# Patient Record
Sex: Female | Born: 1940
Health system: Southern US, Community
[De-identification: ages and names within clinical notes are randomized; demographics above are authoritative.]

## PROBLEM LIST (undated history)

## (undated) DIAGNOSIS — J301 Allergic rhinitis due to pollen: Secondary | ICD-10-CM

## (undated) DIAGNOSIS — E663 Overweight: Secondary | ICD-10-CM

## (undated) DIAGNOSIS — M1991 Primary osteoarthritis, unspecified site: Secondary | ICD-10-CM

## (undated) DIAGNOSIS — N183 Chronic kidney disease, stage 3 (moderate): Secondary | ICD-10-CM

## (undated) DIAGNOSIS — E785 Hyperlipidemia, unspecified: Secondary | ICD-10-CM

## (undated) DIAGNOSIS — Z8611 Personal history of tuberculosis: Secondary | ICD-10-CM

## (undated) DIAGNOSIS — R569 Unspecified convulsions: Secondary | ICD-10-CM

## (undated) DIAGNOSIS — M4125 Other idiopathic scoliosis, thoracolumbar region: Secondary | ICD-10-CM

## (undated) DIAGNOSIS — I5189 Other ill-defined heart diseases: Secondary | ICD-10-CM

## (undated) DIAGNOSIS — I10 Essential (primary) hypertension: Secondary | ICD-10-CM

## (undated) HISTORY — PX: TUBAL LIGATION: SHX77

## (undated) HISTORY — DX: Primary osteoarthritis, unspecified site: M19.91

## (undated) HISTORY — DX: Chronic kidney disease, stage 3 (moderate): N18.3

## (undated) HISTORY — DX: Allergic rhinitis due to pollen: J30.1

## (undated) HISTORY — DX: Unspecified convulsions: R56.9

## (undated) HISTORY — DX: Hyperlipidemia, unspecified: E78.5

## (undated) HISTORY — PX: LUNG LOBECTOMY: SHX167

## (undated) HISTORY — DX: Other ill-defined heart diseases: I51.89

## (undated) HISTORY — PX: TONSILLECTOMY: SUR1361

## (undated) HISTORY — DX: Essential (primary) hypertension: I10

## (undated) HISTORY — DX: Personal history of tuberculosis: Z86.11

## (undated) HISTORY — DX: Overweight: E66.3

## (undated) HISTORY — DX: Other idiopathic scoliosis, thoracolumbar region: M41.25

---
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
## 2008-03-01 ENCOUNTER — Encounter: Payer: Self-pay | Admitting: Emergency Medicine

## 2008-03-01 ENCOUNTER — Inpatient Hospital Stay (HOSPITAL_COMMUNITY): Admission: EM | Admit: 2008-03-01 | Discharge: 2008-03-04 | Payer: Self-pay | Admitting: Cardiology

## 2008-03-01 ENCOUNTER — Ambulatory Visit: Payer: Self-pay | Admitting: *Deleted

## 2008-03-02 ENCOUNTER — Encounter (INDEPENDENT_AMBULATORY_CARE_PROVIDER_SITE_OTHER): Payer: Self-pay | Admitting: Cardiology

## 2008-05-14 ENCOUNTER — Encounter: Payer: Self-pay | Admitting: Emergency Medicine

## 2008-05-15 ENCOUNTER — Inpatient Hospital Stay (HOSPITAL_COMMUNITY): Admission: EM | Admit: 2008-05-15 | Discharge: 2008-05-21 | Payer: Self-pay

## 2010-12-06 NOTE — Discharge Summary (Signed)
Caitlin Cervantes, Caitlin Cervantes               ACCOUNT NO.:  [id number]   MEDICAL RECORD NO.:  [id number]          PATIENT TYPE:  INP   LOCATION:  [id number]                         FACILITY:  [facility]   PHYSICIAN:  Cherene Altes, M.D.DATE OF BIRTH:  31-Dec-1940   DATE OF ADMISSION:  05/15/2008  DATE OF DISCHARGE:  05/21/2008                               DISCHARGE SUMMARY   PRIMARY CARE PHYSICIAN:  Sigmund I. Gaynelle Arabian, M.D., internal medicine.   DISCHARGE DIAGNOSES:  1. Severe extensive right lung community-acquired pneumonia.  2. Prior history of tuberculosis with acid-fast bacillus smears      negative x3 during this hospital stay.      a.     Left lower lobe resection during childhood.  3. Hypertension.  4. History of non-ST segment elevation myocardial infarction secondary      to hypertensive emergency with no significant coronary artery      disease.  5. Seasonal allergies.  6. Lactose intolerance.  7. Sick euthyroidism - low thyroid-stimulating hormone with normal      free T3 and free T4.  8. Empiric treatment for seasonal influenza versus H1N1 with      completion of Tamiflu course.   DISCHARGE MEDICATIONS:  1. Aspirin 81 mg p.o. daily.  2. Metoprolol ER 100 mg p.o. daily.  3. Benazepril/Norvasc 10/21 p.o. daily.  4. Avelox 400 mg p.o. daily for 4 days, then stop.   FOLLOW UP:  The patient states that she intends to follow up at  [facility].  She is arranged for followup in 3-5  days as per her wishes for routine reevaluation of her medical status.   CONSULTATIONS:  None.   PROCEDURES:  None.   HOSPITAL COURSE:  Caitlin Cervantes is a very pleasant 70 year old  female who was admitted to the hospital on May 14, 2008 with  complaints of severe shortness of breath.  A chest x-ray revealed a  diffuse severe infiltrate consistent with a severe right lung pneumonia.  History from the patient revealed that the patient had been treated with  a left lower  lobe lung resection for TB during her childhood.  Out of  concern for this, the patient was transferred to a negative pressure  room and kept in isolation initially.  She was treated empirically for  possible H1N1 influenza versus seasonal influenza with a full course of  Tamiflu.  She was treated empirically with antibiotics to cover the  typical community-acquired pathogens as well as atypicals.  Sputum smear  was accomplished x3 and all 3 were negative for AFB.  Respiratory  isolation requiring negative pressure room was therefore discontinued.  Droplet isolation was continued.  With ongoing use of intravenous  antibiotics, the patient improved significantly.  She was titrated off  of oxygen and cleared for discharge home.   FOLLOW-UP PLANS:  Are as discussed above.  Fortunately, the patient's  other chronic medical problems remained stable throughout the course of  her hospital stay.      Cherene Altes, M.D.  Electronically Signed     JTM/MEDQ  D:  05/20/2008  T:  05/20/2008  Job:  [id number]   cc:   Shane Crutch. Gaynelle Arabian, M.D.

## 2010-12-06 NOTE — H&P (Signed)
NAMEDENETRICE, STARCK NO.:  [id number]   MEDICAL RECORD NO.:  [id number]          PATIENT TYPE:  EMS   LOCATION:  ED                           [facility]   PHYSICIAN:  Rise Patience, MDDATE OF BIRTH:  [birth year]   DATE OF ADMISSION:  05/14/2008  DATE OF DISCHARGE:                              HISTORY & PHYSICAL   PRIMARY CARE PHYSICIAN:  Unassigned.   CHIEF COMPLAINT:  Shortness of breath.   HISTORY OF PRESENT ILLNESS:  A 70 year old female with a recent  discharge in August of 2009 after being treated for hypertensive  urgency, non-ST elevation MI status post cardiac cath, history of  tuberculosis as a child, had a surgery with left lower lobe resection at  age 65, lactose intolerance presented to the ER because of increasing  shortness of breath.  Patient states her symptoms started over a week  ago where she did go to an urgent care.  She was given antibiotics,  Zithromax, despite which her symptoms have not improved.  It initially  started off as a cough with no productive sputum and eventually started  getting short of breath.  Shortness of breath initially was on exertion,  presently it is also present at rest.  Patient at this time was not in  any acute distress.  Denies any chest pain, fever, or chills.  Denies  any headache, diarrhea, dysuria, abdominal pain, nausea, vomiting,  weakness of limbs, or loss of consciousness.  Indeed, patient had a  chest x-ray which showed infiltrate and the patient has been admitted  for further evaluation and management of her pneumonia.   PAST MEDICAL HISTORY:  1. Non-ST elevation MI.  2. Hypertension.  3. Seasonal allergies.  4. Iron-deficiency anemia.  5. History of tuberculosis as a child at age 17 with left lower lobe      lobectomy.   PAST SURGICAL HISTORY:  1. Left lower lobe lobectomy.  2. Cardiac cath in August of 2009 which was normal.   MEDICATIONS PRIOR TO ADMISSION:  1. Metoprolol ER 100  mg p.o. daily.  2. Benazepril and Norvasc 10/20 mg p.o. daily.  3. Aspirin 81 mg p.o. daily.   ALLERGIES:  NO KNOWN DRUG ALLERGIES.   FAMILY HISTORY:  Nothing contributory.   SOCIAL HISTORY:  Patient denies smoking cigarettes, drinking alcohol, or  using illegal drugs.   REVIEW OF SYSTEMS:  As per in history of presenting illness, nothing  else significant.   PHYSICAL EXAMINATION:  Patient examined at bedside, not in acute  distress.  VITAL SIGNS:  Blood pressure is 147/76.  Pulse 96 per minute.  Temperature 98.6.  Respirations 18 per minute.  O2 sat 93%.  HEENT:  Anicteric.  No pallor.  CHEST:  Bilateral air entry present.  No rhonchi.  No crepitation.  HEART:  S1 and S2 heard.  ABDOMEN:  Soft, nontender.  Bowel sounds heard.  No guarding or  rigidity.  CNS:  Alert, awake, and oriented to time, place, and person.  No focal  deficits.  EXTREMITIES:  Peripheral pulses felt.  No edema.   LABS:  Chest x-ray shows extensive pneumonia of the right lung.  CBC,  WBC is 9.3, hemoglobin 10.4, hematocrit 32.7, platelets 480.  Basic  metabolic panel, sodium [id number], potassium 3.3, chloride 102, carbon dioxide  30, glucose of 111, BUN 4, creatinine 0.6, calcium 9.1, BNP 162.   ASSESSMENT:  1. Pneumonia.  2. History of hypertension.  3. Recent non-ST elevation myocardial infarction.  4. History of tuberculosis in childhood status post left-sided      lobectomy.  5. Iron-deficiency anemia.   PLAN:  Admit patient to tele.  We will follow blood cultures.  Obtain  sputum cultures.  Sputum for AFB, PPD.  Place patient on respiratory  isolation and droplet precautions.  Give IV antibiotics, Tamiflu.  Further recommendations as patient's condition evolves.      Rise Patience, MD  Electronically Signed     ANK/MEDQ  D:  05/14/2008  T:  05/14/2008  Job:  [id number]

## 2010-12-06 NOTE — H&P (Signed)
Caitlin Cervantes, Caitlin Cervantes               ACCOUNT NO.:  [id number]   MEDICAL RECORD NO.:  [id number]          PATIENT TYPE:  INP   LOCATION:  2912                         FACILITY:  [facility]   PHYSICIAN:  Justice Britain, MD   DATE OF BIRTH:  10/01/1940   DATE OF ADMISSION:  03/01/2008  DATE OF DISCHARGE:                              HISTORY & PHYSICAL   PRIMARY CARDIOLOGIST:  None.   PRIMARY MEDICAL DOCTOR:  None.   CHIEF COMPLAINT:  Shortness of breath.   HISTORY OF PRESENT ILLNESS:  Caitlin Cervantes is a 70 year old woman with  history of seasonal allergies who is not currently followed by a  physician.  She presented to the [facility] emergency department after  an episode of dyspnea on exertion today as she was leaving church.  She  thought she might have been having a panic attack and says that her  symptoms resolved after she sat down to rest for a little while.  She  has never had similar symptoms before.  She reports that she has been  having an exacerbation of her allergic symptoms over the past few days  with increased rhinorrhea and postnasal drip.  She denied any chest  pain, fevers, chills, cough (except related post nasal drip), lower  extremity edema, orthopnea, PND, palpitations, syncope or presyncope.   Later in the evening, she was telling a family member about her episode  and she was convinced to come in for further evaluation to the emergency  department.   In the [facility] emergency room, an EKG was obtained which  demonstrated LVH with ST depressions.  The physicians there were  concerned for ACS.  They also noted that her blood pressure was 200/120.  Rapid blood test demonstrated a positive troponin I of 0.39 and MB  fraction of 10.5.  She was treated with aspirin, heparin, Lopressor and  nitro paste and transferred to [facility] for further evaluation.  Here  her blood pressure was in the [id number] systolic, and she was treated with a  dose of labetalol with  improvement in her blood pressure.   PAST MEDICAL HISTORY:  1. Seasonal allergies.  2. History of TB as a child treated with a left lower lobe resection      with negative chest x-ray since.  3. No prior cardiac history and no history of cardiac catheterization.   ALLERGIES:  No known drug allergies, though she is lactose intolerance.   MEDICATIONS:  Zyrtec as needed, multivitamin daily, Mucinex daily.   SOCIAL HISTORY:  Caitlin Cervantes lives in [place] with her son.  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  She is  single and has one son.  She is a nonsmoker and does not drink alcohol.   FAMILY HISTORY:  Her father had coronary disease and heart attacks, but  lived to the age of 37.  There is also hypertension in her family  history.  There is no history of sudden cardiac death or hypertrophic  cardiomyopathy.   REVIEW OF SYSTEMS:  A 9-system review of systems  is completely negative  except as mentioned above.  Code status is full.   PHYSICAL EXAMINATION:  VITAL SIGNS:  The patient is afebrile, heart rate  was 110 on presentation and currently 75, blood pressure was 197/121 on  presentation, currently 126/76.  Oxygen saturation 100% on room air.  GENERAL:  She is a pleasant woman in no apparent distress.  HEENT:  Normocephalic atraumatic.  Extraocular movements intact.  Mucous  membranes moist.  NECK:  Supple without lymphadenopathy or thyromegaly.  There is no JVD.  Carotids are 2+ without bruits.  CARDIOVASCULAR:  Regular rate and rhythm with hyperdynamic precordium.  Normal S1-S2.  No S3.  Positive S4.  There are otherwise no murmurs,  rubs.  Peripheral pulses are 2+ and equal without bruits.  LUNGS:  Clear to auscultation bilaterally with decreased breath sounds  at the left base.  There is a long well-healed scar in the medial aspect  of the left scapula.  SKIN:  No rashes or lesions.  ABDOMEN:  Soft, nontender, nondistended with normal  active bowel sounds.  No rebound or guarding.  EXTREMITIES:  No clubbing, cyanosis or edema.  MUSCULOSKELETAL:  No significant joint deformities, effusions or  tenderness.  NEUROLOGICAL:  Exam is grossly normal.   RADIOLOGY:  Pleural chest x-ray demonstrated scoliosis, scarring at the  left costophrenic angle, but no edema or infiltrates.  EKG demonstrated  a rate of 100 with sinus rhythm, normal axis, normal intervals,  increased voltage meeting criteria for LVH with ST depressions in V4-V6  as well as in the inferior leads consistent with either repole or  ischemia.   LABORATORY DATA:  White count 8.9, hematocrit 33.5, platelets 478.  Sodium 135, potassium 3.1, CO2 26, BUN 3, creatinine 0.9, glucose 126,  BNP 58, CK 404, MB 10.5, troponin-I 0.39.  Urinalysis was negative.   ASSESSMENT/PLAN:  Caitlin Cervantes is a 70 year old woman presenting with  shortness of breath, LVH on EKG, hypertension, and positive troponin.  I  suspected that her underlying issue is longstanding and diagnosed  hypertension with hypertensive cardiomyopathy and LVH who presents now  with a hypertensive emergency and positive troponin in that setting.  Alternatively, this could be a manifestation of an acute coronary  syndrome/NSTEMI, but I think this is less likely.  1. Positive cardiac enzymes.  We will continue to cycle cardiac      enzymes and check lipids.  Will treat with heparin, aspirin,      statin, beta blocker and ACE inhibitor for possible acute coronary      syndrome.  Will plan for cardiac catheterization to evaluate her      coronaries and rule out acute coronary syndrome.  2. Hypertensive heart disease.  We will obtain an echocardiogram,      initiate therapy with beta blocker and ACE inhibitor.  Depending on      what her cardiac catheterization shows, her antihypertensive      regimen can be adjusted to the appropriate classes of medications.  3. History of TB as a child with a normal chest  x-rays and currently      normal chest x-ray.  4. Fluids, electrolytes, nutrition (FEN).  Will put on a no added salt      diet and replete her potassium.  5. Glucose was mildly elevated on presentation, although it is unclear      if this is fasting or not.  Will check hemoglobin A1c to evaluate      for undiagnosed diabetes.  Justice Britain, MD  Electronically Signed     DWM/MEDQ  D:  03/02/2008  T:  03/02/2008  Job:  [id number]

## 2010-12-09 NOTE — Discharge Summary (Signed)
Caitlin, Cervantes               ACCOUNT NO.:  [id number]   MEDICAL RECORD NO.:  [id number]          PATIENT TYPE:  INP   LOCATION:  2019                         FACILITY:  [facility]   PHYSICIAN:  Barnett Abu, M.D.  DATE OF BIRTH:  1940-09-21   DATE OF ADMISSION:  03/01/2008  DATE OF DISCHARGE:  03/04/2008                               DISCHARGE SUMMARY   DISCHARGE DIAGNOSES:  1. Hypertensive urgency, resolved.  2. Non-ST segment elevated myocardial infarction secondary to      hypertensive urgency.  3. Iron deficiency anemia.  4. Seasonal allergies.  5. History of tuberculosis as a child, treated with left lower lobe      resection with no chest x-ray since that time.  6. Lactose intolerance.   HOSPITAL COURSE:  Caitlin Cervantes is a 70 year old female who presented to [place]  [facility] emergency room after an episode of shortness of breath.  She  thought she might be having a panic attack and she states that her  symptoms resolved after she sat down to breathe.  Later that evening,  family member advised to return to the emergency room.  In the emergency  room, an EKG showed LVH with ST depression.  Her blood pressure was  noted to be at 200/120.  Her troponins were elevated up to 0.39  consistent with non-ST-segment elevated myocardial infarction.   Ultimately, she underwent cardiac catheterization which showed no  obstructive disease.  Her non-ST segment elevated myocardial infarction  was felt to be secondary to hypertensive urgency.  She was treated with  aggressive medical therapy and by March 04, 2008, she was felt to be  ready for discharge to home.   Lab studies during her hospital stay include hemoglobin of 9.7,  hematocrit 30.0, white count 9.9, platelets 479.  Anemia panel showed  low iron saturation.  Sodium 139, potassium 3.4, BUN 8, creatinine 0.80,  glucose 124, hemoglobin A1c 6.0, TSH 0.269, total cholesterol 160,  triglycerides 75, HDL 40, LDL 105, BNP 58.  Chest x-ray:   Marked  thoracolumbar scoliosis with probable scarring in the left costophrenic  angle.   DISCHARGE MEDICATIONS:  1. Lotrel 5/10 one tablet daily.  2. Metoprolol ER 50 mg a day.  3. Nu-Iron 150 mg twice a day.  4. Zyrtec p.r.n.  5. Mucinex as needed.   Remain on a low-sodium heart-healthy diet.  Clean cath site gently with  soap and water.  No scrubbing.  Increase activity slowly.  No lifting  over 10 pounds for 1 week.  No driving for 2 days.  Follow up with Dr.  Leonia Reeves on March 17, 2008, at 1 p.m.      Joesphine Bare, P.A.      Barnett Abu, M.D.  Electronically Signed    LB/MEDQ  D:  04/18/2008  T:  04/19/2008  Job:  [id number]

## 2011-04-21 LAB — CBC
HCT: 30 — ABNORMAL LOW
HCT: 33.5 — ABNORMAL LOW
Hemoglobin: 10.8 — ABNORMAL LOW
MCHC: 31.6
MCHC: 32.3
MCV: 85.4
RBC: 3.51 — ABNORMAL LOW
RBC: 3.52 — ABNORMAL LOW
RDW: 15.9 — ABNORMAL HIGH
WBC: 9.9

## 2011-04-21 LAB — RETICULOCYTES
RBC.: 3.6 — ABNORMAL LOW
Retic Count, Absolute: 54
Retic Ct Pct: 1.5

## 2011-04-21 LAB — TSH: TSH: 0.269 — ABNORMAL LOW

## 2011-04-21 LAB — HEPARIN LEVEL (UNFRACTIONATED)
Heparin Unfractionated: 1.11 — ABNORMAL HIGH
Heparin Unfractionated: 1.77 — ABNORMAL HIGH

## 2011-04-21 LAB — POCT CARDIAC MARKERS
CKMB, poc: 11.2
Troponin i, poc: 0.23 — ABNORMAL HIGH

## 2011-04-21 LAB — BASIC METABOLIC PANEL
CO2: 25
Calcium: 9.4
GFR calc Af Amer: >60
Glucose, Bld: 110 — ABNORMAL HIGH
Potassium: 3.1 — ABNORMAL LOW
Sodium: 142

## 2011-04-21 LAB — DIFFERENTIAL
Basophils Absolute: 0.3 — ABNORMAL HIGH
Basophils Relative: 3 — ABNORMAL HIGH
Eosinophils Absolute: 0.1
Monocytes Absolute: 0.7
Monocytes Relative: 8

## 2011-04-21 LAB — COMPREHENSIVE METABOLIC PANEL
ALT: 22
Alkaline Phosphatase: 71
CO2: 25
Creatinine, Ser: 0.8
GFR calc non Af Amer: >60
Glucose, Bld: 124 — ABNORMAL HIGH
Potassium: 3.4 — ABNORMAL LOW
Total Protein: 7.2

## 2011-04-21 LAB — CARDIAC PANEL(CRET KIN+CKTOT+MB+TROPI): Total CK: 353 — ABNORMAL HIGH

## 2011-04-21 LAB — FERRITIN: Ferritin: 13 (ref 10–291)

## 2011-04-21 LAB — LIPID PANEL
HDL: 40
LDL Cholesterol: 105 — ABNORMAL HIGH

## 2011-04-21 LAB — URINALYSIS, ROUTINE W REFLEX MICROSCOPIC
Glucose, UA: NEGATIVE
Hgb urine dipstick: NEGATIVE
Protein, ur: NEGATIVE
Specific Gravity, Urine: 1.006
Urobilinogen, UA: 0.2
pH: 6

## 2011-04-21 LAB — CK TOTAL AND CKMB (NOT AT ARMC)
CK, MB: 10.5 — ABNORMAL HIGH
CK, MB: 7.7 — ABNORMAL HIGH

## 2011-04-21 LAB — IRON AND TIBC
Iron: 22 — ABNORMAL LOW
Saturation Ratios: 5 — ABNORMAL LOW
TIBC: 405

## 2011-04-21 LAB — POCT I-STAT, CHEM 8
BUN: 3 — ABNORMAL LOW
Hemoglobin: 12.2
Potassium: 3.1 — ABNORMAL LOW
Sodium: 135

## 2011-04-21 LAB — B-NATRIURETIC PEPTIDE (CONVERTED LAB): Pro B Natriuretic peptide (BNP): 58

## 2011-04-21 LAB — VITAMIN B12: Vitamin B-12: 1734 — ABNORMAL HIGH (ref 211–911)

## 2011-04-25 LAB — AFB CULTURE WITH SMEAR (NOT AT ARMC): Acid Fast Smear: NONE SEEN

## 2011-04-25 LAB — BASIC METABOLIC PANEL
BUN: 3 mg/dL — ABNORMAL LOW (ref 6–23)
BUN: 4 mg/dL — ABNORMAL LOW (ref 6–23)
CO2: 29 mEq/L (ref 19–32)
CO2: 30 mEq/L (ref 19–32)
Calcium: 8.9 mg/dL (ref 8.4–10.5)
Calcium: 9.1 mg/dL (ref 8.4–10.5)
Chloride: 102 mEq/L (ref 96–112)
Chloride: 103 mEq/L (ref 96–112)
Creatinine, Ser: 0.6 mg/dL (ref 0.4–1.2)
Creatinine, Ser: 0.78 mg/dL (ref 0.4–1.2)
GFR calc Af Amer: >60 mL/min (ref >60)
GFR calc Af Amer: >60 mL/min (ref >60)
GFR calc non Af Amer: >60 mL/min (ref >60)
GFR calc non Af Amer: >60 mL/min (ref >60)
Glucose, Bld: 111 mg/dL — ABNORMAL HIGH (ref 70–99)
Potassium: 4.4 mEq/L (ref 3.5–5.1)
Potassium: 4.5 mEq/L (ref 3.5–5.1)
Sodium: 142 mEq/L (ref 135–145)
Sodium: 143 mEq/L (ref 135–145)

## 2011-04-25 LAB — COMPREHENSIVE METABOLIC PANEL
AST: 30 U/L (ref 0–37)
Albumin: 3.5 g/dL (ref 3.5–5.2)
Alkaline Phosphatase: 110 U/L (ref 39–117)
BUN: 3 mg/dL — ABNORMAL LOW (ref 6–23)
Chloride: 101 mEq/L (ref 96–112)
GFR calc Af Amer: >60 mL/min (ref >60)
Potassium: 3.3 mEq/L — ABNORMAL LOW (ref 3.5–5.1)
Total Bilirubin: 0.3 mg/dL (ref 0.3–1.2)
Total Protein: 7.8 g/dL (ref 6.0–8.3)

## 2011-04-25 LAB — CULTURE, BLOOD (ROUTINE X 2)

## 2011-04-25 LAB — CBC
HCT: 30.2 % — ABNORMAL LOW (ref 36.0–46.0)
HCT: 30.8 % — ABNORMAL LOW (ref 36.0–46.0)
HCT: 33.4 % — ABNORMAL LOW (ref 36.0–46.0)
Hemoglobin: 10.4 g/dL — ABNORMAL LOW (ref 12.0–15.0)
Hemoglobin: 10.6 g/dL — ABNORMAL LOW (ref 12.0–15.0)
MCHC: 31.8 g/dL (ref 30.0–36.0)
MCHC: 31.8 g/dL (ref 30.0–36.0)
MCV: 84.7 fL (ref 78.0–100.0)
MCV: 86.9 fL (ref 78.0–100.0)
Platelets: 437 10*3/uL — ABNORMAL HIGH (ref 150–400)
Platelets: 455 10*3/uL — ABNORMAL HIGH (ref 150–400)
Platelets: 480 10*3/uL — ABNORMAL HIGH (ref 150–400)
Platelets: 590 10*3/uL — ABNORMAL HIGH (ref 150–400)
RBC: 3.34 MIL/uL — ABNORMAL LOW (ref 3.87–5.11)
RBC: 3.57 MIL/uL — ABNORMAL LOW (ref 3.87–5.11)
RBC: 3.86 MIL/uL — ABNORMAL LOW (ref 3.87–5.11)
RDW: 18.1 % — ABNORMAL HIGH (ref 11.5–15.5)
RDW: 18.3 % — ABNORMAL HIGH (ref 11.5–15.5)
WBC: 10.8 10*3/uL — ABNORMAL HIGH (ref 4.0–10.5)
WBC: 8.5 10*3/uL (ref 4.0–10.5)

## 2011-04-25 LAB — DIFFERENTIAL
Basophils Absolute: 0 10*3/uL (ref 0.0–0.1)
Basophils Relative: 1 % (ref 0–1)
Lymphocytes Relative: 25 % (ref 12–46)
Monocytes Absolute: 0.9 10*3/uL (ref 0.1–1.0)
Neutro Abs: 5.7 10*3/uL (ref 1.7–7.7)
Neutrophils Relative %: 62 % (ref 43–77)

## 2011-04-25 LAB — CULTURE, RESPIRATORY W GRAM STAIN: Gram Stain: NONE SEEN

## 2011-04-25 LAB — T4, FREE: Free T4: 1.15 ng/dL (ref 0.89–1.80)

## 2011-04-25 LAB — B-NATRIURETIC PEPTIDE (CONVERTED LAB): Pro B Natriuretic peptide (BNP): 162 pg/mL — ABNORMAL HIGH (ref 0.0–100.0)

## 2015-05-22 ENCOUNTER — Inpatient Hospital Stay (HOSPITAL_COMMUNITY): Payer: Medicare Other

## 2015-05-22 ENCOUNTER — Emergency Department (HOSPITAL_COMMUNITY): Payer: Medicare Other

## 2015-05-22 ENCOUNTER — Inpatient Hospital Stay (HOSPITAL_COMMUNITY)
Admission: EM | Admit: 2015-05-22 | Discharge: 2015-05-24 | DRG: 069 | Disposition: A | Discharge status: Home / Self Care | Payer: Medicare Other | Attending: Student in an Organized Health Care Education/Training Program | Admitting: Student in an Organized Health Care Education/Training Program

## 2015-05-22 DIAGNOSIS — I10 Essential (primary) hypertension: Secondary | ICD-10-CM | POA: Diagnosis present

## 2015-05-22 DIAGNOSIS — R4701 Aphasia: Secondary | ICD-10-CM | POA: Diagnosis present

## 2015-05-22 DIAGNOSIS — E876 Hypokalemia: Secondary | ICD-10-CM | POA: Diagnosis present

## 2015-05-22 DIAGNOSIS — R402431 Glasgow coma scale score 3-8, in the field [EMT or ambulance]: Secondary | ICD-10-CM | POA: Diagnosis not present

## 2015-05-22 DIAGNOSIS — G459 Transient cerebral ischemic attack, unspecified: Secondary | ICD-10-CM | POA: Diagnosis not present

## 2015-05-22 DIAGNOSIS — R569 Unspecified convulsions: Secondary | ICD-10-CM | POA: Diagnosis present

## 2015-05-22 DIAGNOSIS — E785 Hyperlipidemia, unspecified: Secondary | ICD-10-CM | POA: Diagnosis present

## 2015-05-22 DIAGNOSIS — R2990 Unspecified symptoms and signs involving the nervous system: Secondary | ICD-10-CM | POA: Diagnosis present

## 2015-05-22 DIAGNOSIS — Z8611 Personal history of tuberculosis: Secondary | ICD-10-CM

## 2015-05-22 DIAGNOSIS — I639 Cerebral infarction, unspecified: Secondary | ICD-10-CM | POA: Diagnosis not present

## 2015-05-22 DIAGNOSIS — M47812 Spondylosis without myelopathy or radiculopathy, cervical region: Secondary | ICD-10-CM | POA: Diagnosis not present

## 2015-05-22 HISTORY — DX: Essential (primary) hypertension: I10

## 2015-05-22 LAB — RAPID URINE DRUG SCREEN, HOSP PERFORMED
AMPHETAMINES: NOT DETECTED
Barbiturates: NOT DETECTED
Benzodiazepines: NOT DETECTED
COCAINE: NOT DETECTED
OPIATES: NOT DETECTED
TETRAHYDROCANNABINOL: NOT DETECTED

## 2015-05-22 LAB — DIFFERENTIAL
Basophils Absolute: 0 10*3/uL (ref 0.0–0.1)
Basophils Relative: 0 %
EOS ABS: 0.2 10*3/uL (ref 0.0–0.7)
EOS PCT: 2 %
Lymphocytes Relative: 51 %
Lymphs Abs: 4.3 10*3/uL — ABNORMAL HIGH (ref 0.7–4.0)
Monocytes Absolute: 0.8 10*3/uL (ref 0.1–1.0)
Monocytes Relative: 10 %
NEUTROS PCT: 37 %
Neutro Abs: 3 10*3/uL (ref 1.7–7.7)

## 2015-05-22 LAB — URINALYSIS, ROUTINE W REFLEX MICROSCOPIC
BILIRUBIN URINE: NEGATIVE
Glucose, UA: NEGATIVE mg/dL
Hgb urine dipstick: NEGATIVE
Ketones, ur: NEGATIVE mg/dL
NITRITE: NEGATIVE
PH: 6 (ref 5.0–8.0)
Protein, ur: NEGATIVE mg/dL
SPECIFIC GRAVITY, URINE: 1.039 — AB (ref 1.005–1.030)
Urobilinogen, UA: 0.2 mg/dL (ref 0.0–1.0)

## 2015-05-22 LAB — ETHANOL

## 2015-05-22 LAB — URINE MICROSCOPIC-ADD ON

## 2015-05-22 LAB — I-STAT CHEM 8, ED
BUN: 16 mg/dL (ref 6–20)
Calcium, Ion: 1.1 mmol/L — ABNORMAL LOW (ref 1.13–1.30)
Chloride: 100 mmol/L — ABNORMAL LOW (ref 101–111)
Creatinine, Ser: 0.8 mg/dL (ref 0.44–1.00)
GLUCOSE: 168 mg/dL — AB (ref 65–99)
HEMATOCRIT: 40 % (ref 36.0–46.0)
HEMOGLOBIN: 13.6 g/dL (ref 12.0–15.0)
POTASSIUM: 2.8 mmol/L — AB (ref 3.5–5.1)
SODIUM: 139 mmol/L (ref 135–145)
TCO2: 21 mmol/L (ref 0–100)

## 2015-05-22 LAB — I-STAT TROPONIN, ED: Troponin i, poc: 0 ng/mL (ref 0.00–0.08)

## 2015-05-22 LAB — COMPREHENSIVE METABOLIC PANEL
ALBUMIN: 3.8 g/dL (ref 3.5–5.0)
ALT: 13 U/L — ABNORMAL LOW (ref 14–54)
ANION GAP: 18 — AB (ref 5–15)
AST: 34 U/L (ref 15–41)
Alkaline Phosphatase: 100 U/L (ref 38–126)
BUN: 14 mg/dL (ref 6–20)
CO2: 20 mmol/L — AB (ref 22–32)
Calcium: 9.3 mg/dL (ref 8.9–10.3)
Chloride: 98 mmol/L — ABNORMAL LOW (ref 101–111)
Creatinine, Ser: 0.96 mg/dL (ref 0.44–1.00)
GFR calc non Af Amer: 58 mL/min — ABNORMAL LOW (ref >60)
GLUCOSE: 174 mg/dL — AB (ref 65–99)
POTASSIUM: 2.9 mmol/L — AB (ref 3.5–5.1)
SODIUM: 136 mmol/L (ref 135–145)
TOTAL PROTEIN: 7.5 g/dL (ref 6.5–8.1)
Total Bilirubin: 0.3 mg/dL (ref 0.3–1.2)

## 2015-05-22 LAB — APTT: APTT: 26 s (ref 24–37)

## 2015-05-22 LAB — CBC
HEMATOCRIT: 36.2 % (ref 36.0–46.0)
Hemoglobin: 12 g/dL (ref 12.0–15.0)
MCH: 27.8 pg (ref 26.0–34.0)
MCHC: 33.1 g/dL (ref 30.0–36.0)
MCV: 83.8 fL (ref 78.0–100.0)
Platelets: 402 10*3/uL — ABNORMAL HIGH (ref 150–400)
RBC: 4.32 MIL/uL (ref 3.87–5.11)
RDW: 14.7 % (ref 11.5–15.5)
WBC: 8.3 10*3/uL (ref 4.0–10.5)

## 2015-05-22 LAB — CBG MONITORING, ED: Glucose-Capillary: 162 mg/dL — ABNORMAL HIGH (ref 65–99)

## 2015-05-22 LAB — PROTIME-INR
INR: 1.03 (ref 0.00–1.49)
Prothrombin Time: 13.7 seconds (ref 11.6–15.2)

## 2015-05-22 LAB — MAGNESIUM: Magnesium: 1.8 mg/dL (ref 1.7–2.4)

## 2015-05-22 MED ORDER — POTASSIUM CHLORIDE 10 MEQ/100ML IV SOLN
10.0000 meq | INTRAVENOUS | Status: AC
  Administered 2015-05-22 (×3): 10 meq via INTRAVENOUS
  Filled 2015-05-22 (×3): qty 100

## 2015-05-22 MED ORDER — SENNOSIDES-DOCUSATE SODIUM 8.6-50 MG PO TABS: 1.0000 | ORAL_TABLET | Freq: Every evening | ORAL | Status: DC | PRN

## 2015-05-22 MED ORDER — LORAZEPAM 2 MG/ML IJ SOLN
1.0000 mg | Freq: Once | INTRAMUSCULAR | Status: AC
  Administered 2015-05-22: 1 mg via INTRAVENOUS

## 2015-05-22 MED ORDER — LORAZEPAM 2 MG/ML IJ SOLN
INTRAMUSCULAR | Status: AC
  Administered 2015-05-22: 1 mg via INTRAVENOUS
  Filled 2015-05-22: qty 1

## 2015-05-22 MED ORDER — ENOXAPARIN SODIUM 40 MG/0.4ML ~~LOC~~ SOLN
40.0000 mg | SUBCUTANEOUS | Status: DC
  Administered 2015-05-22 – 2015-05-23 (×2): 40 mg via SUBCUTANEOUS
  Filled 2015-05-22 (×2): qty 0.4

## 2015-05-22 MED ORDER — ENOXAPARIN SODIUM 40 MG/0.4ML ~~LOC~~ SOLN: 40.0000 mg | SUBCUTANEOUS | Status: DC

## 2015-05-22 MED ORDER — STROKE: EARLY STAGES OF RECOVERY BOOK
Freq: Once | Status: AC
  Administered 2015-05-22: 19:00:00

## 2015-05-22 MED ORDER — IOHEXOL 350 MG/ML SOLN
50.0000 mL | Freq: Once | INTRAVENOUS | Status: AC | PRN
  Administered 2015-05-22: 50 mL via INTRAVENOUS

## 2015-05-22 MED ORDER — POTASSIUM CHLORIDE 10 MEQ/100ML IV SOLN
10.0000 meq | Freq: Once | INTRAVENOUS | Status: AC
  Administered 2015-05-22: 10 meq via INTRAVENOUS
  Filled 2015-05-22: qty 100

## 2015-05-22 MED ORDER — ADULT MULTIVITAMIN W/MINERALS CH
1.0000 | ORAL_TABLET | Freq: Every day | ORAL | Status: DC
  Administered 2015-05-22 – 2015-05-24 (×3): 1 via ORAL
  Filled 2015-05-22 (×3): qty 1

## 2015-05-22 NOTE — Consult Note (Signed)
Referring Physician: Pollina    Chief Complaint: Difficulty with speech  HPI: Caitlin Cervantes is an 74 y.o. female who was with her son today.  They went to the grocery store and she returned home normal.  He went to take a shower and when he came back to the room she was slumped over with the groceries on the floor.  She was shaking and drooling from the left side of her mouth.  EMS was called at that time and the patient was brought to the ED where a code stroke was called.  Patient was unable to speak with EMS arrival.  Initial NIHSS of 4. Per family patient has had 3 prior similar events with the last being about 3 weeks ago.  Each were only a few minutes long.  On one of these occasions she was slumped over and unresponsive like today but on another she was walking and just stopped and became unresponsive without losing tone.  She has not previously been evaluated for these events.    Date last known well: Date: 05/22/2015 Time last known well: Time: 12:00 tPA Given: No: Resolution of symptoms  MRankin: 0  Past medical history: None  No past surgical history on file.  Family history: Parents deceased but of unknown reasons.  Sister with hypertension.  Son alive and well.    Social History:  No history of tobacco, alcohol or illicit drug abuse.  Allergies: NKDA  Medications: None  ROS: History obtained from the patient  General ROS: negative for - chills, fatigue, fever, night sweats, weight gain or weight loss Psychological ROS: negative for - behavioral disorder, hallucinations, memory difficulties, mood swings or suicidal ideation Ophthalmic ROS: negative for - blurry vision, double vision, eye pain or loss of vision ENT ROS: negative for - epistaxis, nasal discharge, oral lesions, sore throat, tinnitus or vertigo Allergy and Immunology ROS: negative for - hives or itchy/watery eyes Hematological and Lymphatic ROS: negative for - bleeding problems, bruising or swollen lymph  nodes Endocrine ROS: negative for - galactorrhea, hair pattern changes, polydipsia/polyuria or temperature intolerance Respiratory ROS: negative for - cough, hemoptysis, shortness of breath or wheezing Cardiovascular ROS: negative for - chest pain, dyspnea on exertion, edema or irregular heartbeat Gastrointestinal ROS: negative for - abdominal pain, diarrhea, hematemesis, nausea/vomiting or stool incontinence Genito-Urinary ROS: negative for - dysuria, hematuria, incontinence or urinary frequency/urgency Musculoskeletal ROS: negative for - joint swelling or muscular weakness Neurological ROS: as noted in HPI Dermatological ROS: negative for rash and skin lesion changes  Physical Examination: Blood pressure 213/113, pulse 116, resp. rate 19, SpO2 99 %.  HEENT-  Normocephalic, no lesions, without obvious abnormality.  Normal external eye and conjunctiva.  Normal TM's bilaterally.  Normal auditory canals and external ears. Normal external nose, mucus membranes and septum.  Normal pharynx. Cardiovascular- S1, S2 normal, pulses palpable throughout   Lungs- chest clear, no wheezing, rales, normal symmetric air entry Abdomen- soft, non-tender; bowel sounds normal; no masses,  no organomegaly Extremities- no edema Lymph-no adenopathy palpable Musculoskeletal-no joint tenderness, deformity or swelling Skin-warm and dry, no hyperpigmentation, vitiligo, or suspicious lesions  Neurological Examination Mental Status: Alert, not oriented to age or date but knows where she is.  Speech fluent without evidence of aphasia.  Needs reinforcement for 3 step commands.  Able to follow simple commands without difficulty. Cranial Nerves: II: Discs flat bilaterally; Visual fields grossly normal, pupils equal, round, reactive to light and accommodation III,IV, VI: ptosis not present, extra-ocular motions intact bilaterally  V,VII: smile symmetric, facial light touch sensation normal bilaterally VIII: hearing normal  bilaterally IX,X: gag reflex present XI: bilateral shoulder shrug XII: midline tongue extension Motor: Right : Upper extremity   5/5    Left:     Upper extremity   5/5  Lower extremity   4/5     Lower extremity   5/5 Tone and bulk:normal tone throughout; no atrophy noted Sensory: Pinprick and light touch intact throughout, bilaterally Deep Tendon Reflexes: 2+ and symmetric throughout Plantars: Right: downgoing   Left: downgoing Cerebellar: normal finger-to-nose and normal heel-to-shin testing bilaterally Gait: not tested due to safety concerns  Laboratory Studies:  Basic Metabolic Panel:  Recent Labs Lab 05/22/15 1308 05/22/15 1315  NA 136 139  K 2.9* 2.8*  CL 98* 100*  CO2 20*  --   GLUCOSE 174* 168*  BUN 14 16  CREATININE 0.96 0.80  CALCIUM 9.3  --     Liver Function Tests:  Recent Labs Lab 05/22/15 1308  AST 34  ALT 13*  ALKPHOS 100  BILITOT 0.3  PROT 7.5  ALBUMIN 3.8   No results for input(s): LIPASE, AMYLASE in the last 168 hours. No results for input(s): AMMONIA in the last 168 hours.  CBC:  Recent Labs Lab 05/22/15 1308 05/22/15 1315  WBC 8.3  --   NEUTROABS 3.0  --   HGB 12.0 13.6  HCT 36.2 40.0  MCV 83.8  --   PLT 402*  --     Cardiac Enzymes: No results for input(s): CKTOTAL, CKMB, CKMBINDEX, TROPONINI in the last 168 hours.  BNP: Invalid input(s): POCBNP  CBG:  Recent Labs Lab 05/22/15 1308  GLUCAP 162*    Microbiology: Results for orders placed or performed during the hospital encounter of 05/15/08  Culture, blood (routine x 2)     Status: None   Collection Time: 05/15/08  2:30 AM  Result Value Ref Range Status   Specimen Description BLOOD RIGHT ARM  Final   Special Requests BOTTLES DRAWN AEROBIC AND ANAEROBIC 10CC EACH  Final   Culture NO GROWTH 5 DAYS  Final   Report Status 05/21/2008 FINAL  Final  Culture, blood (routine x 2)     Status: None   Collection Time: 05/15/08  2:40 AM  Result Value Ref Range Status    Specimen Description BLOOD LEFT WRIST  Final   Special Requests BOTTLES DRAWN AEROBIC AND ANAEROBIC 10CC EACH  Final   Culture NO GROWTH 5 DAYS  Final   Report Status 05/21/2008 FINAL  Final  AFB culture with smear     Status: None   Collection Time: 05/16/08  1:51 PM  Result Value Ref Range Status   Specimen Description SPUTUM LUNG(RT/LFT UNKN)  Final   Special Requests IMMUNE:NORM  Final   Acid Fast Smear NO ACID FAST BACILLI SEEN  Final   Culture NO ACID FAST BACILLI ISOLATED IN 6 WEEKS  Final   Report Status 06/30/2008 FINAL  Final  AFB culture with smear     Status: None   Collection Time: 05/17/08 10:03 AM  Result Value Ref Range Status   Specimen Description SPUTUM  Final   Special Requests NONE  Final   Acid Fast Smear NO ACID FAST BACILLI SEEN  Final   Culture NO ACID FAST BACILLI ISOLATED IN 6 WEEKS  Final   Report Status 06/30/2008 FINAL  Final    Coagulation Studies:  Recent Labs  05/22/15 1308  LABPROT 13.7  INR 1.03    Urinalysis:  No results for input(s): COLORURINE, LABSPEC, PHURINE, GLUCOSEU, HGBUR, BILIRUBINUR, KETONESUR, PROTEINUR, UROBILINOGEN, NITRITE, LEUKOCYTESUR in the last 168 hours.  Invalid input(s): APPERANCEUR  Lipid Panel:    Component Value Date/Time   CHOL  03/02/2008 0455    160        ATP III CLASSIFICATION:  <200     mg/dL   Desirable  200-239  mg/dL   Borderline High  >=240    mg/dL   High   TRIG 75 03/02/2008 0455   HDL 40 03/02/2008 0455   CHOLHDL 4.0 03/02/2008 0455   VLDL 15 03/02/2008 0455   LDLCALC * 03/02/2008 0455    105        Total Cholesterol/HDL:CHD Risk Coronary Heart Disease Risk Table                     Men   Women  1/2 Average Risk   3.4   3.3    HgbA1C:  Lab Results  Component Value Date   HGBA1C  03/02/2008    6.0 (NOTE)   The ADA recommends the following therapeutic goal for glycemic   control related to Hgb A1C measurement:   Goal of Therapy:   < 7.0% Hgb A1C   Reference: American Diabetes  Association: Clinical Practice   Recommendations 2008, Diabetes Care,  2008, 31:(Suppl 1).    Urine Drug Screen:  No results found for: LABOPIA, [place], [place], AMPHETMU, THCU, LABBARB  Alcohol Level:  Recent Labs Lab 05/22/15 [place] <5    Other results: EKG: sinus tachycardia at 102 bpm.  Imaging: Ct Head Wo Contrast  05/22/2015  CLINICAL DATA:  CODE STROKE,  APHASIC EXAM: CT HEAD WITHOUT CONTRAST TECHNIQUE: Contiguous axial images were obtained from the base of the skull through the vertex without intravenous contrast. COMPARISON:  None. FINDINGS: Atherosclerotic and physiologic intracranial calcifications. Bilateral basal ganglia mineralization. Mild parenchymal atrophy. Moderate areas of hypoattenuation in deep and periventricular white matter bilaterally. Negative for acute intracranial hemorrhage, mass lesion, acute infarction, midline shift, or mass-effect. Acute infarct may be inapparent on noncontrast CT. Ventricles and sulci symmetric. Bone windows demonstrate no focal lesion. IMPRESSION: 1. Negative for bleed or other acute intracranial process. 2. Atrophy and nonspecific white matter changes. Critical Value/emergent results were called by telephone at the time of interpretation on 05/22/2015 at 1:21 pm to Dr. Doy Mince, who verbally acknowledged these results. Electronically Signed   By: Lucrezia Europe M.D.   On: 05/22/2015 13:21    Assessment: 74 y.o. female presenting after an episode of unresponsiveness.  Symptoms are resolving.  Has had similar previous events over the past year.  On no medications at home.  Head CT personally reviewed and shows no acute changes.  CTA of the head and neck personally reviewed as well and shows no large or medium vessel occlusion.  Events may be ischemic in origin but can not rule out seizure or related to arrhythmia since patient with low potassium.  Further work up recommended.    Stroke Risk Factors - none  Plan: 1. HgbA1c, fasting  lipid panel 2. MRI, MRA  of the brain without contrast 3. PT consult, OT consult, Speech consult 4. Echocardiogram 5. Carotid dopplers 6. Prophylactic therapy-Antiplatelet med: Aspirin - dose 325mg  daily 7. NPO until RN stroke swallow screen 8. Telemetry monitoring 9. Frequent neuro checks 10. Potassium replacement 11. EEG  Case discussed with Dr. Charlann Noss, MD [facility] [phone] 05/22/2015, 1:58 PM

## 2015-05-22 NOTE — ED Notes (Signed)
Per ems, pt was LSN at 1200, son was with pt, went to take shower and then noticed pt slumped over in chair, unresponsive, called ems, ems arrived and pt unresponsive for total of 10 minutes, vitals normal; pt became to arouse and was agitated and combative; pt aphasic; strength equal on both sides

## 2015-05-22 NOTE — Progress Notes (Signed)
Code stroke called at 1250, Patient arrived to [facility] ED via [facility].  As per son LSN 1200, states was with her and then left room to take a shower and found her slumped over and unresponsive.  IN EMS truck patient became awake and agitated. In ED, patient aphasic and very agitated and 1 mg ativan was given.  CTA was done NIHSS 4.  Patient improving since arriving.

## 2015-05-22 NOTE — ED Provider Notes (Signed)
CSN: [id number]     Arrival date & time 05/22/15  1250 History   None    Chief Complaint  Patient presents with  . Code Stroke     (Consider location/radiation/quality/duration/timing/severity/associated sxs/prior Treatment) HPI Comments: Patient is brought to emergency department after a syncopal episode followed by confusion. Patient was normal at 12 PM today. Son reports that he talk to her at that point and then went into the shower. When he came out of the shower, he noticed that she was slumped over and unresponsive. EMS was called and found her still unresponsive. They initiated transport and during transport she became awake and now is very agitated. She has been difficult to keep still, seems confused, is not speaking. She has not had any unilateral weakness noted on examination. Patient cannot provide any further information, is nonverbal. She does not follow commands. Level V Caveat due to non-verbal.   No past medical history on file. No past surgical history on file. No family history on file. Social History  Substance Use Topics  . Smoking status: Not on file  . Smokeless tobacco: Not on file  . Alcohol Use: Not on file   OB History    No data available     Review of Systems  Unable to perform ROS: Patient nonverbal      Allergies  Review of patient's allergies indicates not on file.  Home Medications   Prior to Admission medications   Not on File   BP 213/113 mmHg  Pulse 116  Resp 19  SpO2 99%  LMP  (LMP Unknown) Physical Exam  Constitutional: She is oriented to person, place, and time. She appears well-developed and well-nourished. No distress.  HENT:  Head: Normocephalic and atraumatic.  Right Ear: Hearing normal.  Left Ear: Hearing normal.  Nose: Nose normal.  Mouth/Throat: Oropharynx is clear and moist and mucous membranes are normal.  Eyes: Conjunctivae and EOM are normal. Pupils are equal, round, and reactive to light.  Neck: Normal range of  motion. Neck supple.  Cardiovascular: Regular rhythm, S1 normal and S2 normal.  Exam reveals no gallop and no friction rub.   No murmur heard. Pulmonary/Chest: Effort normal and breath sounds normal. No respiratory distress. She exhibits no tenderness.  Abdominal: Soft. Normal appearance and bowel sounds are normal. There is no hepatosplenomegaly. There is no tenderness. There is no rebound, no guarding, no tenderness at McBurney's point and negative Murphy's sign. No hernia.  Musculoskeletal: Normal range of motion.  Neurological: She is alert and oriented to person, place, and time. She has normal strength. No cranial nerve deficit or sensory deficit. Coordination normal. GCS eye subscore is 4. GCS verbal subscore is 3. GCS motor subscore is 6.  Patient extremely agitated, moving all 4 extremities equally. Does not follow commands or speak, could not identify sensory deficit, but there is no motor deficit noted.  Skin: Skin is warm, dry and intact. No rash noted. No cyanosis.  Psychiatric: She has a normal mood and affect. Her speech is normal and behavior is normal. Thought content normal.  Nursing note and vitals reviewed.   ED Course  Procedures (including critical care time) Labs Review Labs Reviewed  CBC - Abnormal; Notable for the following:    Platelets 402 (*)    All other components within normal limits  DIFFERENTIAL - Abnormal; Notable for the following:    Lymphs Abs 4.3 (*)    All other components within normal limits  COMPREHENSIVE METABOLIC PANEL - Abnormal;  Notable for the following:    Potassium 2.9 (*)    Chloride 98 (*)    CO2 20 (*)    Glucose, Bld 174 (*)    ALT 13 (*)    GFR calc non Af Amer 58 (*)    Anion gap 18 (*)    All other components within normal limits  I-STAT CHEM 8, ED - Abnormal; Notable for the following:    Potassium 2.8 (*)    Chloride 100 (*)    Glucose, Bld 168 (*)    Calcium, Ion 1.10 (*)    All other components within normal limits  CBG  MONITORING, ED - Abnormal; Notable for the following:    Glucose-Capillary 162 (*)    All other components within normal limits  ETHANOL  PROTIME-INR  APTT  URINE RAPID DRUG SCREEN, HOSP PERFORMED  URINALYSIS, ROUTINE W REFLEX MICROSCOPIC (NOT AT [facility])  I-STAT TROPOININ, ED    Imaging Review Ct Head Wo Contrast  05/22/2015  CLINICAL DATA:  CODE STROKE,  APHASIC EXAM: CT HEAD WITHOUT CONTRAST TECHNIQUE: Contiguous axial images were obtained from the base of the skull through the vertex without intravenous contrast. COMPARISON:  None. FINDINGS: Atherosclerotic and physiologic intracranial calcifications. Bilateral basal ganglia mineralization. Mild parenchymal atrophy. Moderate areas of hypoattenuation in deep and periventricular white matter bilaterally. Negative for acute intracranial hemorrhage, mass lesion, acute infarction, midline shift, or mass-effect. Acute infarct may be inapparent on noncontrast CT. Ventricles and sulci symmetric. Bone windows demonstrate no focal lesion. IMPRESSION: 1. Negative for bleed or other acute intracranial process. 2. Atrophy and nonspecific white matter changes. Critical Value/emergent results were called by telephone at the time of interpretation on 05/22/2015 at 1:21 pm to Dr. Doy Mince, who verbally acknowledged these results. Electronically Signed   By: Lucrezia Europe M.D.   On: 05/22/2015 13:21   I have personally reviewed and evaluated these images and lab results as part of my medical decision-making.   EKG Interpretation   Date/Time:  Saturday May 22 2015 13:33:52 EDT Ventricular Rate:  102 PR Interval:  163 QRS Duration: 65 QT Interval:  371 QTC Calculation: 483 R Axis:   96 Text Interpretation:  Sinus tachycardia Left atrial enlargement Right axis  deviation Consider left ventricular hypertrophy Probable lateral infarct,  age indeterminate Abnormal T, consider ischemia, diffuse leads Confirmed  by Desira Alessandrini  MD, Tahjae Clausing [phone]) on  05/22/2015 1:59:56 PM      MDM   Final diagnoses:  Stroke ([place])    Presents to the emergency department for evaluation of acute onset of unresponsiveness followed by aphasia. Last known normal was at noon. Patient was felt to be in the window for treatment, code stroke was initiated. Patient has, however, improved here in the ER now, aphasia is resolving. Patient will have additional imaging ordered by neurology, to be admitted by medicine for further stroke workup.    Orpah Greek, MD 05/22/15 [phone]

## 2015-05-22 NOTE — H&P (Signed)
Date: 05/22/2015               Patient Name:  Caitlin Cervantes MRN: [id number]  DOB: 03/26/1941 Age / Sex: 74 y.o., female   PCP: No Pcp Per Patient         Medical Service: Internal Medicine Teaching Service         Attending Physician: Dr. Orpah Greek, MD    First Contact: Dr. Lovena Le  Pager: [phone]  Second Contact: Dr. Gordy Levan Pager: [phone]       After Hours (After 5p/  First Contact Pager: [phone]  weekends / holidays): Second Contact Pager: [phone]   Chief Complaint: stroke  History of Present Illness: [name] is a 74 yo female with HTN, presenting as code stroke.  Patient was at home this morning in her usual state of health, when her son noticed that she had a blank stare, was nonresponsive, drooling of her left mouth, and then started to have low amplitude shaking of her arms.  She started to slump over and she was caught by her son.  She was evaluated in the ED, with initial NIHSS of 4.  She returned to her baseline by the end of exam and tPA was not administered.  She has had two similar episodes in the last 2 months.  They are reported as staring spells in which the patient is nonresponsive, but maintains her posture while sitting or standing.  There is no nystagums.  No loss of bowel or bladder control.  She does not remember the episodes.  She has no history of seizure or stroke.  She has a history of HTN, but does not see a doctor or take any medications. She has never been told she has DM or HLD.  She does not smoke or drink alcohol.  She has a family h/o DM, HTN, and HLD.    She lives with her son and is able to perform all her ADLs and IADLs.   Meds: Current Facility-Administered Medications  Medication Dose Route Frequency Provider Last Rate Last Dose  . potassium chloride 10 mEq in 100 mL IVPB  10 mEq Intravenous Q1 Hr x 4 Jones Bales, MD       No current outpatient prescriptions on file.    Allergies: Allergies as of 05/22/2015  . (Not on  File)   No past medical history on file. No past surgical history on file. No family history on file. Social History   Social History  . Marital Status: Single    Spouse Name: N/A  . Number of Children: N/A  . Years of Education: N/A   Occupational History  . Not on file.   Social History Main Topics  . Smoking status: Not on file  . Smokeless tobacco: Not on file  . Alcohol Use: Not on file  . Drug Use: Not on file  . Sexual Activity: Not on file   Other Topics Concern  . Not on file   Social History Narrative  . No narrative on file    Review of Systems: A comprehensive review of systems was negative.  Physical Exam: Blood pressure 119/69, pulse 87, resp. rate 20, SpO2 95 %. Physical Exam  Constitutional:  Frail, elderly female, sleeping in bed. Easily arousable.  HENT:  Head: Normocephalic.  Eyes: EOM are normal. Pupils are equal, round, and reactive to light.  Neck: No JVD present. No tracheal deviation present.  Cardiovascular: Normal rate, regular rhythm, normal heart sounds and  intact distal pulses.   Pulmonary/Chest: Effort normal. No respiratory distress. She has no wheezes.  Minimal crackles in bilateral bases.  Abdominal: Soft. She exhibits no distension. There is no tenderness. There is no rebound and no guarding.  Musculoskeletal: Normal range of motion. She exhibits no edema.  Neurological: She is alert.  Oriented to person and place, not time.  CNII-XII intact to bedside testing.  Strength 5/5 in UE and LE.  Moderate dysmetria to FNF and HNK, which is bilateral and symmetric.  No pronator drift.  Patellar and biceps reflexes 2+ and symmetric. Babinski downgoing bilaterally.  Unable to spell WORLD backwards or perform delayed three word recall.  Skin: Skin is warm and dry. No rash noted.     Lab results: Basic Metabolic Panel:  Recent Labs  05/22/15 1308 05/22/15 1315  NA 136 139  K 2.9* 2.8*  CL 98* 100*  CO2 20*  --   GLUCOSE 174* 168*   BUN 14 16  CREATININE 0.96 0.80  CALCIUM 9.3  --    Liver Function Tests:  Recent Labs  05/22/15 1308  AST 34  ALT 13*  ALKPHOS 100  BILITOT 0.3  PROT 7.5  ALBUMIN 3.8   No results for input(s): LIPASE, AMYLASE in the last 72 hours. No results for input(s): AMMONIA in the last 72 hours. CBC:  Recent Labs  05/22/15 1308 05/22/15 1315  WBC 8.3  --   NEUTROABS 3.0  --   HGB 12.0 13.6  HCT 36.2 40.0  MCV 83.8  --   PLT 402*  --    Cardiac Enzymes: No results for input(s): CKTOTAL, CKMB, CKMBINDEX, TROPONINI in the last 72 hours. BNP: No results for input(s): PROBNP in the last 72 hours. D-Dimer: No results for input(s): DDIMER in the last 72 hours. CBG:  Recent Labs  05/22/15 1308  GLUCAP 162*   Hemoglobin A1C: No results for input(s): HGBA1C in the last 72 hours. Fasting Lipid Panel: No results for input(s): CHOL, HDL, LDLCALC, TRIG, CHOLHDL, LDLDIRECT in the last 72 hours. Thyroid Function Tests: No results for input(s): TSH, T4TOTAL, FREET4, T3FREE, THYROIDAB in the last 72 hours. Anemia Panel: No results for input(s): VITAMINB12, FOLATE, FERRITIN, TIBC, IRON, RETICCTPCT in the last 72 hours. Coagulation:  Recent Labs  05/22/15 1308  LABPROT 13.7  INR 1.03   Urine Drug Screen: Drugs of Abuse  No results found for: LABOPIA, COCAINSCRNUR, LABBENZ, AMPHETMU, THCU, LABBARB  Alcohol Level:  Recent Labs  05/22/15 [place] <5   Urinalysis: No results for input(s): COLORURINE, LABSPEC, PHURINE, GLUCOSEU, HGBUR, BILIRUBINUR, KETONESUR, PROTEINUR, UROBILINOGEN, NITRITE, LEUKOCYTESUR in the last 72 hours.  Invalid input(s): APPERANCEUR Misc. Labs:   Imaging results:  Ct Angio Head W/cm &/or Wo Cm  05/22/2015  CLINICAL DATA:  Acute onset of unresponsiveness and aphasia without extremity weakness earlier today. Last seen normal at 12 noon. EXAM: CT ANGIOGRAPHY HEAD AND NECK TECHNIQUE: Multidetector CT imaging of the head and neck was performed  using the standard protocol during bolus administration of intravenous contrast. Multiplanar CT image reconstructions and MIPs were obtained to evaluate the vascular anatomy. Carotid stenosis measurements (when applicable) are obtained utilizing NASCET criteria, using the distal internal carotid diameter as the denominator. CONTRAST:  107mL OMNIPAQUE IOHEXOL 350 MG/ML SOLN COMPARISON:  CT head earlier today. FINDINGS: CT HEAD Calvarium and skull base: No fracture or destructive lesion. Mastoids and middle ears are grossly clear. Paranasal sinuses: Slight layering fluid in both divisions of the sphenoid of uncertain  significance. Orbits: Negative. Brain: No evidence of acute abnormality, including acute infarct, hemorrhage, hydrocephalus, or mass lesion. Mild atrophy. Chronic microvascular ischemic change. CTA NECK Aortic arch: Standard branching. Imaged portion shows no evidence of aneurysm or dissection. No significant stenosis of the major arch vessel origins. Right carotid system: Fairly dense but non stenotic calcific plaque at the RIGHT ICA origin. Markedly dolichoectatic cervical ICA is retropharyngeal as is the LEFT, so-called kissing carotids. No evidence of dissection, stenosis (50% or greater) or occlusion. Left carotid system: Fairly dense but non stenotic mostly calcific plaque at the LEFT ICA origin. No evidence of dissection, stenosis (50% or greater) or occlusion. Vertebral arteries: RIGHT dominant. No evidence of dissection, stenosis (50% or greater) or occlusion. Nonvascular soft tissues: No neck masses. Severe cervical spondylosis with degenerative scoliosis convex RIGHT. Dental caries. Chronic scarring in the lung apices, with moderate LEFT-sided pleural thickening, and posterior segment RIGHT upper lobe bronchiectasis, similar to prior CT from 05/19/2008. CTA HEAD Anterior circulation: Non stenotic calcific plaque throughout the cavernous and supraclinoid ICA segments. No significant A1 or M1 MCA  disease. No MCA branch occlusion. No significant stenosis, proximal occlusion, aneurysm, or vascular malformation. Posterior circulation: RIGHT vertebral dominant. No significant stenosis, proximal occlusion, aneurysm, or vascular malformation. Venous sinuses: As permitted by contrast timing, patent. Anatomic variants: None of significance. : No features to suggest large vessel or significant MCA branch occlusion. Atrophy and small vessel disease. Non stenotic and predominately calcific atheromatous change at the carotid bifurcations and also the carotid siphons. Cervical spondylosis. Chronic lung disease. Critical Value/emergent results were called by telephone at the time of interpretation on 05/22/2015 at 2:00 pm to Dr. Alexis Goodell , who verbally acknowledged these results. Electronically Signed   By: Staci Righter M.D.   On: 05/22/2015 14:13   Ct Head Wo Contrast  05/22/2015  CLINICAL DATA:  CODE STROKE,  APHASIC EXAM: CT HEAD WITHOUT CONTRAST TECHNIQUE: Contiguous axial images were obtained from the base of the skull through the vertex without intravenous contrast. COMPARISON:  None. FINDINGS: Atherosclerotic and physiologic intracranial calcifications. Bilateral basal ganglia mineralization. Mild parenchymal atrophy. Moderate areas of hypoattenuation in deep and periventricular white matter bilaterally. Negative for acute intracranial hemorrhage, mass lesion, acute infarction, midline shift, or mass-effect. Acute infarct may be inapparent on noncontrast CT. Ventricles and sulci symmetric. Bone windows demonstrate no focal lesion. IMPRESSION: 1. Negative for bleed or other acute intracranial process. 2. Atrophy and nonspecific white matter changes. Critical Value/emergent results were called by telephone at the time of interpretation on 05/22/2015 at 1:21 pm to Dr. Doy Mince, who verbally acknowledged these results. Electronically Signed   By: Lucrezia Europe M.D.   On: 05/22/2015 13:21   Ct Angio Neck W/cm  &/or Wo/cm  05/22/2015  CLINICAL DATA:  Acute onset of unresponsiveness and aphasia without extremity weakness earlier today. Last seen normal at 12 noon. EXAM: CT ANGIOGRAPHY HEAD AND NECK TECHNIQUE: Multidetector CT imaging of the head and neck was performed using the standard protocol during bolus administration of intravenous contrast. Multiplanar CT image reconstructions and MIPs were obtained to evaluate the vascular anatomy. Carotid stenosis measurements (when applicable) are obtained utilizing NASCET criteria, using the distal internal carotid diameter as the denominator. CONTRAST:  23mL OMNIPAQUE IOHEXOL 350 MG/ML SOLN COMPARISON:  CT head earlier today. FINDINGS: CT HEAD Calvarium and skull base: No fracture or destructive lesion. Mastoids and middle ears are grossly clear. Paranasal sinuses: Slight layering fluid in both divisions of the sphenoid of uncertain significance. Orbits: Negative.  Brain: No evidence of acute abnormality, including acute infarct, hemorrhage, hydrocephalus, or mass lesion. Mild atrophy. Chronic microvascular ischemic change. CTA NECK Aortic arch: Standard branching. Imaged portion shows no evidence of aneurysm or dissection. No significant stenosis of the major arch vessel origins. Right carotid system: Fairly dense but non stenotic calcific plaque at the RIGHT ICA origin. Markedly dolichoectatic cervical ICA is retropharyngeal as is the LEFT, so-called kissing carotids. No evidence of dissection, stenosis (50% or greater) or occlusion. Left carotid system: Fairly dense but non stenotic mostly calcific plaque at the LEFT ICA origin. No evidence of dissection, stenosis (50% or greater) or occlusion. Vertebral arteries: RIGHT dominant. No evidence of dissection, stenosis (50% or greater) or occlusion. Nonvascular soft tissues: No neck masses. Severe cervical spondylosis with degenerative scoliosis convex RIGHT. Dental caries. Chronic scarring in the lung apices, with moderate  LEFT-sided pleural thickening, and posterior segment RIGHT upper lobe bronchiectasis, similar to prior CT from 05/19/2008. CTA HEAD Anterior circulation: Non stenotic calcific plaque throughout the cavernous and supraclinoid ICA segments. No significant A1 or M1 MCA disease. No MCA branch occlusion. No significant stenosis, proximal occlusion, aneurysm, or vascular malformation. Posterior circulation: RIGHT vertebral dominant. No significant stenosis, proximal occlusion, aneurysm, or vascular malformation. Venous sinuses: As permitted by contrast timing, patent. Anatomic variants: None of significance. : No features to suggest large vessel or significant MCA branch occlusion. Atrophy and small vessel disease. Non stenotic and predominately calcific atheromatous change at the carotid bifurcations and also the carotid siphons. Cervical spondylosis. Chronic lung disease. Critical Value/emergent results were called by telephone at the time of interpretation on 05/22/2015 at 2:00 pm to Dr. Alexis Goodell , who verbally acknowledged these results. Electronically Signed   By: Staci Righter M.D.   On: 05/22/2015 14:13    Other results: EKG: sinus tach, LVH.  Assessment & Plan by Problem: Principal Problem:   Stroke-like symptoms Active Problems:   Hypertension  Ms. Bashor is a 74 yo female with HTN presenting with stroke-like symtpoms.  Seizure vs TIA: Patient's first couple staring spells concerning for absence seizures. However, they would be abnormal in a patient of her age without previous h/o seizure.  The episode leading to her current presentation, however, is more concerning for more generalized seizure (shaking, loss of posture).  These seizures may be precipitated by chronic ischemic disease in the setting of longstanding untreated HTN.  Weakness noted on presentation to ED has resolved.  CT brain showed no acute stroke.  Patient will need TIA workup and EEG. [ ]  A1c  [ ]  Lipids [ ]  Echo [ ]   Carotid dopplers - PT/OT/SLP - ASA 325 daily - [place] [ ]  EEG [ ]  MRI brain  Hypokalemia: With K 2.9, it is concerning for possible cardiac etiology of TIA like symptoms.  Patient denies symptoms and has no known cardiac history.  Will monitor on teley and replete K. - K repletion [ ]  Mg - [place]  FEN/GI: - Regular diet if passes swallow  DVT Ppx: Lovenox  Dispo: Disposition is deferred at this time, awaiting improvement of current medical problems. Anticipated discharge in approximately 1-2 day(s).   The patient does not have a current PCP (No Pcp Per Patient) and does need an [facility] follow-up appointment after discharge.  The patient does not have transportation limitations that hinder transportation to clinic appointments.  Signed: Iline Oven, MD 05/22/2015, 3:25 PM

## 2015-05-22 NOTE — ED Notes (Signed)
CBG 162 

## 2015-05-22 NOTE — Progress Notes (Signed)
Patient admitted from ED. Patient alert and oriented x.4. Patient with family and was oriented to the room.

## 2015-05-22 NOTE — ED Notes (Signed)
Code stroke activated ?

## 2015-05-23 ENCOUNTER — Encounter (HOSPITAL_COMMUNITY): Payer: Medicare Other

## 2015-05-23 ENCOUNTER — Encounter (HOSPITAL_COMMUNITY): Payer: Self-pay | Admitting: *Deleted

## 2015-05-23 DIAGNOSIS — Z7982 Long term (current) use of aspirin: Secondary | ICD-10-CM

## 2015-05-23 DIAGNOSIS — R2990 Unspecified symptoms and signs involving the nervous system: Secondary | ICD-10-CM

## 2015-05-23 DIAGNOSIS — E876 Hypokalemia: Secondary | ICD-10-CM

## 2015-05-23 DIAGNOSIS — E785 Hyperlipidemia, unspecified: Secondary | ICD-10-CM

## 2015-05-23 DIAGNOSIS — I10 Essential (primary) hypertension: Secondary | ICD-10-CM

## 2015-05-23 HISTORY — DX: Hyperlipidemia, unspecified: E78.5

## 2015-05-23 LAB — BASIC METABOLIC PANEL
Anion gap: 7 (ref 5–15)
BUN: 9 mg/dL (ref 6–20)
CALCIUM: 9.4 mg/dL (ref 8.9–10.3)
CO2: 29 mmol/L (ref 22–32)
CREATININE: 0.76 mg/dL (ref 0.44–1.00)
Chloride: 100 mmol/L — ABNORMAL LOW (ref 101–111)
Glucose, Bld: 92 mg/dL (ref 65–99)
Potassium: 3.3 mmol/L — ABNORMAL LOW (ref 3.5–5.1)
SODIUM: 136 mmol/L (ref 135–145)

## 2015-05-23 LAB — TSH: TSH: 0.385 u[IU]/mL (ref 0.350–4.500)

## 2015-05-23 LAB — LIPID PANEL
CHOLESTEROL: 232 mg/dL — AB (ref 0–200)
HDL: 59 mg/dL (ref >40)
LDL Cholesterol: 157 mg/dL — ABNORMAL HIGH (ref 0–99)
Total CHOL/HDL Ratio: 3.9 RATIO
Triglycerides: 81 mg/dL (ref <150)
VLDL: 16 mg/dL (ref 0–40)

## 2015-05-23 LAB — TROPONIN I

## 2015-05-23 MED ORDER — ATORVASTATIN CALCIUM 40 MG PO TABS
40.0000 mg | ORAL_TABLET | Freq: Every day | ORAL | Status: DC
  Administered 2015-05-23: 40 mg via ORAL
  Filled 2015-05-23: qty 1

## 2015-05-23 MED ORDER — HYDRALAZINE HCL 10 MG PO TABS
10.0000 mg | ORAL_TABLET | Freq: Once | ORAL | Status: AC
  Administered 2015-05-23: 10 mg via ORAL
  Filled 2015-05-23: qty 1

## 2015-05-23 MED ORDER — ASPIRIN 325 MG PO TABS
325.0000 mg | ORAL_TABLET | Freq: Every day | ORAL | Status: DC
  Administered 2015-05-23 – 2015-05-24 (×2): 325 mg via ORAL
  Filled 2015-05-23 (×2): qty 1

## 2015-05-23 MED ORDER — POTASSIUM CHLORIDE CRYS ER 20 MEQ PO TBCR
40.0000 meq | EXTENDED_RELEASE_TABLET | Freq: Once | ORAL | Status: AC
  Administered 2015-05-23: 40 meq via ORAL
  Filled 2015-05-23: qty 2

## 2015-05-23 NOTE — Progress Notes (Signed)
SLP Cancellation Note  Patient Details Name: Caitlin Cervantes MRN: [id number] DOB: [birth year]   Cancelled treatment:       Reason Eval/Treat Not Completed: Other (comment).  Pt passed RN stroke swallow screen, is on a diet and tolerating well per RN, therefore SLP  eval not warranted.  Will sign off.    Juan Quam Laurice 05/23/2015, 11:10 AM

## 2015-05-23 NOTE — Progress Notes (Signed)
Subjective: Patient feels she is back to baseline.  No further episodes of unresposiveness  Objective: Current vital signs: BP 182/88 mmHg  Pulse 86  Temp(Src) 98.7 F (37.1 C) (Oral)  Resp 20  Ht 5\' 1"  (1.549 m)  Wt 48.353 kg (106 lb 9.6 oz)  BMI 20.15 kg/m2  SpO2 99%  LMP  (LMP Unknown) Vital signs in last 24 hours: Temp:  [97.9 F (36.6 C)-99.5 F (37.5 C)] 98.7 F (37.1 C) (10/30 1403) Pulse Rate:  [64-89] 86 (10/30 1403) Resp:  [19-23] 20 (10/30 1403) BP: (150-207)/(78-104) 182/88 mmHg (10/30 1403) SpO2:  [99 %-100 %] 99 % (10/30 1403) Weight:  [48.353 kg (106 lb 9.6 oz)] 48.353 kg (106 lb 9.6 oz) (10/29 1900)  Intake/Output from previous day:   Intake/Output this shift: Total I/O In: 240 [P.O.:240] Out: -  Nutritional status: Diet Heart Room service appropriate?: Yes; Fluid consistency:: Thin  Neurologic Exam: Mental Status: Alert, oriented. Speech fluent without evidence of aphasia. Follows 3 step commands without difficulty. Cranial Nerves: II: Discs flat bilaterally; Visual fields grossly normal, pupils equal, round, reactive to light and accommodation III,IV, VI: ptosis not present, extra-ocular motions intact bilaterally V,VII: smile symmetric, facial light touch sensation normal bilaterally VIII: hearing normal bilaterally IX,X: gag reflex present XI: bilateral shoulder shrug XII: midline tongue extension Motor: Right :Upper extremity 5/5Left: Upper extremity 5/5 Lower extremity 5/5Lower extremity 5/5 Tone and bulk:normal tone throughout; no atrophy noted Sensory: Pinprick and light touch intact throughout, bilaterally Deep Tendon Reflexes: 2+ and symmetric throughout  Lab Results: Basic Metabolic Panel:  Recent Labs Lab 05/22/15 1308 05/22/15 1315 05/22/15 1856 05/23/15 0626  NA 136 139  --  136  K 2.9* 2.8*  --  3.3*  CL 98*  100*  --  100*  CO2 20*  --   --  29  GLUCOSE 174* 168*  --  92  BUN 14 16  --  9  CREATININE 0.96 0.80  --  0.76  CALCIUM 9.3  --   --  9.4  MG  --   --  1.8  --     Liver Function Tests:  Recent Labs Lab 05/22/15 1308  AST 34  ALT 13*  ALKPHOS 100  BILITOT 0.3  PROT 7.5  ALBUMIN 3.8   No results for input(s): LIPASE, AMYLASE in the last 168 hours. No results for input(s): AMMONIA in the last 168 hours.  CBC:  Recent Labs Lab 05/22/15 1308 05/22/15 1315  WBC 8.3  --   NEUTROABS 3.0  --   HGB 12.0 13.6  HCT 36.2 40.0  MCV 83.8  --   PLT 402*  --     Cardiac Enzymes:  Recent Labs Lab 05/23/15 0626  TROPONINI <0.03    Lipid Panel:  Recent Labs Lab 05/23/15 0626  CHOL 232*  TRIG 81  HDL 59  CHOLHDL 3.9  VLDL 16  LDLCALC 157*    CBG:  Recent Labs Lab 05/22/15 1308  [place]*    Microbiology: Results for orders placed or performed during the hospital encounter of 05/15/08  Culture, blood (routine x 2)     Status: None   Collection Time: 05/15/08  2:30 AM  Result Value Ref Range Status   Specimen Description BLOOD RIGHT ARM  Final   Special Requests BOTTLES DRAWN AEROBIC AND ANAEROBIC 10CC EACH  Final   Culture NO GROWTH 5 DAYS  Final   Report Status 05/21/2008 FINAL  Final  Culture, blood (routine x 2)  Status: None   Collection Time: 05/15/08  2:40 AM  Result Value Ref Range Status   Specimen Description BLOOD LEFT WRIST  Final   Special Requests BOTTLES DRAWN AEROBIC AND ANAEROBIC 10CC EACH  Final   Culture NO GROWTH 5 DAYS  Final   Report Status 05/21/2008 FINAL  Final  AFB culture with smear     Status: None   Collection Time: 05/16/08  1:51 PM  Result Value Ref Range Status   Specimen Description SPUTUM LUNG(RT/LFT UNKN)  Final   Special Requests IMMUNE:NORM  Final   Acid Fast Smear NO ACID FAST BACILLI SEEN  Final   Culture NO ACID FAST BACILLI ISOLATED IN 6 WEEKS  Final   Report Status 06/30/2008 FINAL  Final  AFB  culture with smear     Status: None   Collection Time: 05/17/08 10:03 AM  Result Value Ref Range Status   Specimen Description SPUTUM  Final   Special Requests NONE  Final   Acid Fast Smear NO ACID FAST BACILLI SEEN  Final   Culture NO ACID FAST BACILLI ISOLATED IN 6 WEEKS  Final   Report Status 06/30/2008 FINAL  Final    Coagulation Studies:  Recent Labs  05/22/15 1308  LABPROT 13.7  INR 1.03    Imaging: Ct Angio Head W/cm &/or Wo Cm  05/22/2015  CLINICAL DATA:  Acute onset of unresponsiveness and aphasia without extremity weakness earlier today. Last seen normal at 12 noon. EXAM: CT ANGIOGRAPHY HEAD AND NECK TECHNIQUE: Multidetector CT imaging of the head and neck was performed using the standard protocol during bolus administration of intravenous contrast. Multiplanar CT image reconstructions and MIPs were obtained to evaluate the vascular anatomy. Carotid stenosis measurements (when applicable) are obtained utilizing NASCET criteria, using the distal internal carotid diameter as the denominator. CONTRAST:  38mL OMNIPAQUE IOHEXOL 350 MG/ML SOLN COMPARISON:  CT head earlier today. FINDINGS: CT HEAD Calvarium and skull base: No fracture or destructive lesion. Mastoids and middle ears are grossly clear. Paranasal sinuses: Slight layering fluid in both divisions of the sphenoid of uncertain significance. Orbits: Negative. Brain: No evidence of acute abnormality, including acute infarct, hemorrhage, hydrocephalus, or mass lesion. Mild atrophy. Chronic microvascular ischemic change. CTA NECK Aortic arch: Standard branching. Imaged portion shows no evidence of aneurysm or dissection. No significant stenosis of the major arch vessel origins. Right carotid system: Fairly dense but non stenotic calcific plaque at the RIGHT ICA origin. Markedly dolichoectatic cervical ICA is retropharyngeal as is the LEFT, so-called kissing carotids. No evidence of dissection, stenosis (50% or greater) or occlusion.  Left carotid system: Fairly dense but non stenotic mostly calcific plaque at the LEFT ICA origin. No evidence of dissection, stenosis (50% or greater) or occlusion. Vertebral arteries: RIGHT dominant. No evidence of dissection, stenosis (50% or greater) or occlusion. Nonvascular soft tissues: No neck masses. Severe cervical spondylosis with degenerative scoliosis convex RIGHT. Dental caries. Chronic scarring in the lung apices, with moderate LEFT-sided pleural thickening, and posterior segment RIGHT upper lobe bronchiectasis, similar to prior CT from 05/19/2008. CTA HEAD Anterior circulation: Non stenotic calcific plaque throughout the cavernous and supraclinoid ICA segments. No significant A1 or M1 MCA disease. No MCA branch occlusion. No significant stenosis, proximal occlusion, aneurysm, or vascular malformation. Posterior circulation: RIGHT vertebral dominant. No significant stenosis, proximal occlusion, aneurysm, or vascular malformation. Venous sinuses: As permitted by contrast timing, patent. Anatomic variants: None of significance. : No features to suggest large vessel or significant MCA branch occlusion. Atrophy and small  vessel disease. Non stenotic and predominately calcific atheromatous change at the carotid bifurcations and also the carotid siphons. Cervical spondylosis. Chronic lung disease. Critical Value/emergent results were called by telephone at the time of interpretation on 05/22/2015 at 2:00 pm to Dr. Alexis Goodell , who verbally acknowledged these results. Electronically Signed   By: Staci Righter M.D.   On: 05/22/2015 14:13   Ct Head Wo Contrast  05/22/2015  CLINICAL DATA:  CODE STROKE,  APHASIC EXAM: CT HEAD WITHOUT CONTRAST TECHNIQUE: Contiguous axial images were obtained from the base of the skull through the vertex without intravenous contrast. COMPARISON:  None. FINDINGS: Atherosclerotic and physiologic intracranial calcifications. Bilateral basal ganglia mineralization. Mild  parenchymal atrophy. Moderate areas of hypoattenuation in deep and periventricular white matter bilaterally. Negative for acute intracranial hemorrhage, mass lesion, acute infarction, midline shift, or mass-effect. Acute infarct may be inapparent on noncontrast CT. Ventricles and sulci symmetric. Bone windows demonstrate no focal lesion. IMPRESSION: 1. Negative for bleed or other acute intracranial process. 2. Atrophy and nonspecific white matter changes. Critical Value/emergent results were called by telephone at the time of interpretation on 05/22/2015 at 1:21 pm to Dr. Doy Mince, who verbally acknowledged these results. Electronically Signed   By: Lucrezia Europe M.D.   On: 05/22/2015 13:21   Ct Angio Neck W/cm &/or Wo/cm  05/22/2015  CLINICAL DATA:  Acute onset of unresponsiveness and aphasia without extremity weakness earlier today. Last seen normal at 12 noon. EXAM: CT ANGIOGRAPHY HEAD AND NECK TECHNIQUE: Multidetector CT imaging of the head and neck was performed using the standard protocol during bolus administration of intravenous contrast. Multiplanar CT image reconstructions and MIPs were obtained to evaluate the vascular anatomy. Carotid stenosis measurements (when applicable) are obtained utilizing NASCET criteria, using the distal internal carotid diameter as the denominator. CONTRAST:  32mL OMNIPAQUE IOHEXOL 350 MG/ML SOLN COMPARISON:  CT head earlier today. FINDINGS: CT HEAD Calvarium and skull base: No fracture or destructive lesion. Mastoids and middle ears are grossly clear. Paranasal sinuses: Slight layering fluid in both divisions of the sphenoid of uncertain significance. Orbits: Negative. Brain: No evidence of acute abnormality, including acute infarct, hemorrhage, hydrocephalus, or mass lesion. Mild atrophy. Chronic microvascular ischemic change. CTA NECK Aortic arch: Standard branching. Imaged portion shows no evidence of aneurysm or dissection. No significant stenosis of the major arch vessel  origins. Right carotid system: Fairly dense but non stenotic calcific plaque at the RIGHT ICA origin. Markedly dolichoectatic cervical ICA is retropharyngeal as is the LEFT, so-called kissing carotids. No evidence of dissection, stenosis (50% or greater) or occlusion. Left carotid system: Fairly dense but non stenotic mostly calcific plaque at the LEFT ICA origin. No evidence of dissection, stenosis (50% or greater) or occlusion. Vertebral arteries: RIGHT dominant. No evidence of dissection, stenosis (50% or greater) or occlusion. Nonvascular soft tissues: No neck masses. Severe cervical spondylosis with degenerative scoliosis convex RIGHT. Dental caries. Chronic scarring in the lung apices, with moderate LEFT-sided pleural thickening, and posterior segment RIGHT upper lobe bronchiectasis, similar to prior CT from 05/19/2008. CTA HEAD Anterior circulation: Non stenotic calcific plaque throughout the cavernous and supraclinoid ICA segments. No significant A1 or M1 MCA disease. No MCA branch occlusion. No significant stenosis, proximal occlusion, aneurysm, or vascular malformation. Posterior circulation: RIGHT vertebral dominant. No significant stenosis, proximal occlusion, aneurysm, or vascular malformation. Venous sinuses: As permitted by contrast timing, patent. Anatomic variants: None of significance. : No features to suggest large vessel or significant MCA branch occlusion. Atrophy and small vessel disease. Non  stenotic and predominately calcific atheromatous change at the carotid bifurcations and also the carotid siphons. Cervical spondylosis. Chronic lung disease. Critical Value/emergent results were called by telephone at the time of interpretation on 05/22/2015 at 2:00 pm to Dr. Alexis Goodell , who verbally acknowledged these results. Electronically Signed   By: Staci Righter M.D.   On: 05/22/2015 14:13   Mr Brain Wo Contrast  05/22/2015  CLINICAL DATA:  Acute onset of unresponsiveness and aphasia  without extremity weakness earlier today. EXAM: MRI HEAD WITHOUT CONTRAST MRA HEAD WITHOUT CONTRAST TECHNIQUE: Multiplanar, multiecho pulse sequences of the brain and surrounding structures were obtained without intravenous contrast. Angiographic images of the head were obtained using MRA technique without contrast. COMPARISON:  CT angiography head and neck as well as noncontrast CT from earlier today. FINDINGS: MRI HEAD FINDINGS No evidence for acute infarction, hemorrhage, mass lesion, hydrocephalus, or extra-axial fluid. Generalized atrophy. Moderately advanced chronic microvascular ischemic change. Advanced spondylosis. No pituitary or cerebellar tonsil abnormality. Extracranial soft tissues unremarkable. MRA HEAD FINDINGS The internal carotid arteries and basilar artery are widely patent. RIGHT vertebral dominant. No intracranial stenosis or aneurysm. IMPRESSION: No acute intracranial findings. No vascular occlusion or proximal flow reducing lesion. Electronically Signed   By: Staci Righter M.D.   On: 05/22/2015 18:05   Mr Jodene Nam Head/brain Wo Cm  05/22/2015  CLINICAL DATA:  Acute onset of unresponsiveness and aphasia without extremity weakness earlier today. EXAM: MRI HEAD WITHOUT CONTRAST MRA HEAD WITHOUT CONTRAST TECHNIQUE: Multiplanar, multiecho pulse sequences of the brain and surrounding structures were obtained without intravenous contrast. Angiographic images of the head were obtained using MRA technique without contrast. COMPARISON:  CT angiography head and neck as well as noncontrast CT from earlier today. FINDINGS: MRI HEAD FINDINGS No evidence for acute infarction, hemorrhage, mass lesion, hydrocephalus, or extra-axial fluid. Generalized atrophy. Moderately advanced chronic microvascular ischemic change. Advanced spondylosis. No pituitary or cerebellar tonsil abnormality. Extracranial soft tissues unremarkable. MRA HEAD FINDINGS The internal carotid arteries and basilar artery are widely patent.  RIGHT vertebral dominant. No intracranial stenosis or aneurysm. IMPRESSION: No acute intracranial findings. No vascular occlusion or proximal flow reducing lesion. Electronically Signed   By: Staci Righter M.D.   On: 05/22/2015 18:05    Medications:  I have reviewed the patient's current medications. Scheduled: . aspirin  325 mg Oral Daily  . atorvastatin  40 mg Oral q1800  . enoxaparin (LOVENOX) injection  40 mg Subcutaneous Q24H  . multivitamin with minerals  1 tablet Oral Daily    Assessment/Plan: Patient back to baseline.  Unclear etiology of symptoms.  MRI of the brain personally reviewed and shows no acute changes.  Echocardiogram and carotid dopplers are pending.  EEG pending.  Remains on ASA.    Recommendations: 1.  Will continue to follow up results of work up    LOS: 1 day   Alexis Goodell, MD [facility] [phone] 05/23/2015  3:36 PM

## 2015-05-23 NOTE — Evaluation (Signed)
Physical Therapy Evaluation Patient Details Name: Caitlin Cervantes MRN: [id number] DOB: 25-Mar-1941 Today's Date: 05/23/2015   History of Present Illness  Pt is a 74 y.o. female admitted for stroke-like symptoms. PMH consists of HTN, but pt does not see a doctor or take meds.  Clinical Impression  Pt is at baseline level of function. She is independent with all functional mobility. No PT needs indicated. PT signing off.    Follow Up Recommendations No PT follow up    Equipment Recommendations  None recommended by PT    Recommendations for Other Services       Precautions / Restrictions Precautions Precautions: None      Mobility  Bed Mobility Overal bed mobility: Independent                Transfers Overall transfer level: Independent                  Ambulation/Gait Ambulation/Gait assistance: Independent Ambulation Distance (Feet): 200 Feet Assistive device: None Gait Pattern/deviations: WFL(Within Functional Limits)   Gait velocity interpretation: >2.62 ft/sec, indicative of independent Tourist information centre manager [employer] (Stroke Patients Only)       Balance Overall balance assessment: No apparent balance deficits (not formally assessed)                                           Pertinent Vitals/Pain Pain Assessment: No/denies pain    Home Living Family/patient expects to be discharged to:: Private residence Living Arrangements: Children Available Help at Discharge: Family;Available PRN/intermittently Type of Home: Apartment Home Access: Stairs to enter Entrance Stairs-Rails: Psychiatric nurse of Steps: 10 Home Layout: One level Home Equipment: None      Prior Function Level of Independence: Independent               Hand Dominance        Extremity/Trunk Assessment   Upper Extremity Assessment: Overall WFL for tasks assessed            Lower Extremity Assessment: Overall WFL for tasks assessed      Cervical / Trunk Assessment: Normal  Communication   Communication: No difficulties  Cognition Arousal/Alertness: Awake/alert Behavior During Therapy: WFL for tasks assessed/performed Overall Cognitive Status: Within Functional Limits for tasks assessed                      General Comments      Exercises        Assessment/Plan    PT Assessment Patent does not need any further PT services  PT Diagnosis Difficulty walking   PT Problem List    PT Treatment Interventions     PT Goals (Current goals can be found in the Care Plan section) Acute Rehab PT Goals Patient Stated Goal: home PT Goal Formulation: All assessment and education complete, DC therapy    Frequency     Barriers to discharge        Co-evaluation               End of Session Equipment Utilized During Treatment: Gait belt Activity Tolerance: Patient tolerated treatment well Patient left: in bed;with call bell/phone within reach Nurse Communication: Mobility status         Time: 1215-1229 PT Time  Calculation (min) (ACUTE ONLY): 14 min   Charges:   PT Evaluation $Initial PT Evaluation Tier I: 1 Procedure     PT G Codes:        Lorriane Shire 05/23/2015, 1:15 PM

## 2015-05-23 NOTE — Progress Notes (Signed)
Subjective: NAEON. Patient alert and oriented x3 this morning.  She feels completely normal without complaint.  She does not recall most of the day yesterday, remembering only that she had gone to the grocery store and then returned home.  Objective: Vital signs in last 24 hours: Filed Vitals:   05/23/15 0328 05/23/15 0445 05/23/15 0600 05/23/15 0946  BP: 174/99 165/98 173/97 160/90  Pulse: 73 78 78 89  Temp:  98.4 F (36.9 C) 99.5 F (37.5 C) 98.5 F (36.9 C)  TempSrc:  Oral Oral Oral  Resp:  22 20 20   Height:      Weight:      SpO2:  100% 100% 99%   Weight change:   Intake/Output Summary (Last 24 hours) at 05/23/15 1124 Last data filed at 05/23/15 0947  Gross per 24 hour  Intake    240 ml  Output      0 ml  Net    240 ml   Physical Exam  Constitutional: She is oriented to person, place, and time.  Pleasant, elderly female, lying in bed, no distress.  HENT:  Head: Normocephalic and atraumatic.  Eyes: EOM are normal.  Neck: No JVD present. No tracheal deviation present.  Cardiovascular: Normal rate, regular rhythm, normal heart sounds and intact distal pulses.   Pulmonary/Chest: Effort normal. No respiratory distress.  Minimal scattered wheezing.  Abdominal: Soft. She exhibits no distension. There is no tenderness. There is no rebound and no guarding.  Musculoskeletal: Normal range of motion. She exhibits no edema.  Neurological: She is alert and oriented to person, place, and time.    Lab Results: Basic Metabolic Panel:  Recent Labs Lab 05/22/15 1308 05/22/15 1315 05/22/15 1856 05/23/15 0626  NA 136 139  --  136  K 2.9* 2.8*  --  3.3*  CL 98* 100*  --  100*  CO2 20*  --   --  29  GLUCOSE 174* 168*  --  92  BUN 14 16  --  9  CREATININE 0.96 0.80  --  0.76  CALCIUM 9.3  --   --  9.4  MG  --   --  1.8  --    Liver Function Tests:  Recent Labs Lab 05/22/15 1308  AST 34  ALT 13*  ALKPHOS 100  BILITOT 0.3  PROT 7.5  ALBUMIN 3.8   No results for  input(s): LIPASE, AMYLASE in the last 168 hours. No results for input(s): AMMONIA in the last 168 hours. CBC:  Recent Labs Lab 05/22/15 1308 05/22/15 1315  WBC 8.3  --   NEUTROABS 3.0  --   HGB 12.0 13.6  HCT 36.2 40.0  MCV 83.8  --   PLT 402*  --    Cardiac Enzymes:  Recent Labs Lab 05/23/15 0626  TROPONINI <0.03   BNP: No results for input(s): PROBNP in the last 168 hours. D-Dimer: No results for input(s): DDIMER in the last 168 hours. CBG:  Recent Labs Lab 05/22/15 1308  GLUCAP 162*   Hemoglobin A1C: No results for input(s): HGBA1C in the last 168 hours. Fasting Lipid Panel:  Recent Labs Lab 05/23/15 0626  CHOL 232*  HDL 59  LDLCALC 157*  TRIG 81  CHOLHDL 3.9   Thyroid Function Tests:  Recent Labs Lab 05/22/15 2240  TSH 0.385   Coagulation:  Recent Labs Lab 05/22/15 1308  LABPROT 13.7  INR 1.03   Anemia Panel: No results for input(s): VITAMINB12, FOLATE, FERRITIN, TIBC, IRON, RETICCTPCT in the last  168 hours. Urine Drug Screen: Drugs of Abuse     Component Value Date/Time   LABOPIA NONE DETECTED 05/22/2015 1555   COCAINSCRNUR NONE DETECTED 05/22/2015 1555   LABBENZ NONE DETECTED 05/22/2015 1555   AMPHETMU NONE DETECTED 05/22/2015 1555   THCU NONE DETECTED 05/22/2015 1555   LABBARB NONE DETECTED 05/22/2015 1555    Alcohol Level:  Recent Labs Lab 05/22/15 1308  ETH <5   Urinalysis:  Recent Labs Lab 05/22/15 [place] 6.0  GLUCOSEU NEGATIVE  HGBUR NEGATIVE  BILIRUBINUR NEGATIVE  KETONESUR NEGATIVE  PROTEINUR NEGATIVE  UROBILINOGEN 0.2  NITRITE NEGATIVE  LEUKOCYTESUR TRACE*   Misc. Labs:   Micro Results: No results found for this or any previous visit (from the past 240 hour(s)). Studies/Results: Ct Angio Head W/cm &/or Wo Cm  05/22/2015  CLINICAL DATA:  Acute onset of unresponsiveness and aphasia without extremity weakness earlier today. Last seen normal at 12 noon. EXAM:  CT ANGIOGRAPHY HEAD AND NECK TECHNIQUE: Multidetector CT imaging of the head and neck was performed using the standard protocol during bolus administration of intravenous contrast. Multiplanar CT image reconstructions and MIPs were obtained to evaluate the vascular anatomy. Carotid stenosis measurements (when applicable) are obtained utilizing NASCET criteria, using the distal internal carotid diameter as the denominator. CONTRAST:  12mL OMNIPAQUE IOHEXOL 350 MG/ML SOLN COMPARISON:  CT head earlier today. FINDINGS: CT HEAD Calvarium and skull base: No fracture or destructive lesion. Mastoids and middle ears are grossly clear. Paranasal sinuses: Slight layering fluid in both divisions of the sphenoid of uncertain significance. Orbits: Negative. Brain: No evidence of acute abnormality, including acute infarct, hemorrhage, hydrocephalus, or mass lesion. Mild atrophy. Chronic microvascular ischemic change. CTA NECK Aortic arch: Standard branching. Imaged portion shows no evidence of aneurysm or dissection. No significant stenosis of the major arch vessel origins. Right carotid system: Fairly dense but non stenotic calcific plaque at the RIGHT ICA origin. Markedly dolichoectatic cervical ICA is retropharyngeal as is the LEFT, so-called kissing carotids. No evidence of dissection, stenosis (50% or greater) or occlusion. Left carotid system: Fairly dense but non stenotic mostly calcific plaque at the LEFT ICA origin. No evidence of dissection, stenosis (50% or greater) or occlusion. Vertebral arteries: RIGHT dominant. No evidence of dissection, stenosis (50% or greater) or occlusion. Nonvascular soft tissues: No neck masses. Severe cervical spondylosis with degenerative scoliosis convex RIGHT. Dental caries. Chronic scarring in the lung apices, with moderate LEFT-sided pleural thickening, and posterior segment RIGHT upper lobe bronchiectasis, similar to prior CT from 05/19/2008. CTA HEAD Anterior circulation: Non stenotic  calcific plaque throughout the cavernous and supraclinoid ICA segments. No significant A1 or M1 MCA disease. No MCA branch occlusion. No significant stenosis, proximal occlusion, aneurysm, or vascular malformation. Posterior circulation: RIGHT vertebral dominant. No significant stenosis, proximal occlusion, aneurysm, or vascular malformation. Venous sinuses: As permitted by contrast timing, patent. Anatomic variants: None of significance. : No features to suggest large vessel or significant MCA branch occlusion. Atrophy and small vessel disease. Non stenotic and predominately calcific atheromatous change at the carotid bifurcations and also the carotid siphons. Cervical spondylosis. Chronic lung disease. Critical Value/emergent results were called by telephone at the time of interpretation on 05/22/2015 at 2:00 pm to Dr. Alexis Goodell , who verbally acknowledged these results. Electronically Signed   By: Staci Righter M.D.   On: 05/22/2015 14:13   Ct Head Wo Contrast  05/22/2015  CLINICAL DATA:  CODE STROKE,  APHASIC EXAM: CT HEAD WITHOUT CONTRAST  TECHNIQUE: Contiguous axial images were obtained from the base of the skull through the vertex without intravenous contrast. COMPARISON:  None. FINDINGS: Atherosclerotic and physiologic intracranial calcifications. Bilateral basal ganglia mineralization. Mild parenchymal atrophy. Moderate areas of hypoattenuation in deep and periventricular white matter bilaterally. Negative for acute intracranial hemorrhage, mass lesion, acute infarction, midline shift, or mass-effect. Acute infarct may be inapparent on noncontrast CT. Ventricles and sulci symmetric. Bone windows demonstrate no focal lesion. IMPRESSION: 1. Negative for bleed or other acute intracranial process. 2. Atrophy and nonspecific white matter changes. Critical Value/emergent results were called by telephone at the time of interpretation on 05/22/2015 at 1:21 pm to Dr. Doy Mince, who verbally acknowledged  these results. Electronically Signed   By: Lucrezia Europe M.D.   On: 05/22/2015 13:21   Ct Angio Neck W/cm &/or Wo/cm  05/22/2015  CLINICAL DATA:  Acute onset of unresponsiveness and aphasia without extremity weakness earlier today. Last seen normal at 12 noon. EXAM: CT ANGIOGRAPHY HEAD AND NECK TECHNIQUE: Multidetector CT imaging of the head and neck was performed using the standard protocol during bolus administration of intravenous contrast. Multiplanar CT image reconstructions and MIPs were obtained to evaluate the vascular anatomy. Carotid stenosis measurements (when applicable) are obtained utilizing NASCET criteria, using the distal internal carotid diameter as the denominator. CONTRAST:  80mL OMNIPAQUE IOHEXOL 350 MG/ML SOLN COMPARISON:  CT head earlier today. FINDINGS: CT HEAD Calvarium and skull base: No fracture or destructive lesion. Mastoids and middle ears are grossly clear. Paranasal sinuses: Slight layering fluid in both divisions of the sphenoid of uncertain significance. Orbits: Negative. Brain: No evidence of acute abnormality, including acute infarct, hemorrhage, hydrocephalus, or mass lesion. Mild atrophy. Chronic microvascular ischemic change. CTA NECK Aortic arch: Standard branching. Imaged portion shows no evidence of aneurysm or dissection. No significant stenosis of the major arch vessel origins. Right carotid system: Fairly dense but non stenotic calcific plaque at the RIGHT ICA origin. Markedly dolichoectatic cervical ICA is retropharyngeal as is the LEFT, so-called kissing carotids. No evidence of dissection, stenosis (50% or greater) or occlusion. Left carotid system: Fairly dense but non stenotic mostly calcific plaque at the LEFT ICA origin. No evidence of dissection, stenosis (50% or greater) or occlusion. Vertebral arteries: RIGHT dominant. No evidence of dissection, stenosis (50% or greater) or occlusion. Nonvascular soft tissues: No neck masses. Severe cervical spondylosis with  degenerative scoliosis convex RIGHT. Dental caries. Chronic scarring in the lung apices, with moderate LEFT-sided pleural thickening, and posterior segment RIGHT upper lobe bronchiectasis, similar to prior CT from 05/19/2008. CTA HEAD Anterior circulation: Non stenotic calcific plaque throughout the cavernous and supraclinoid ICA segments. No significant A1 or M1 MCA disease. No MCA branch occlusion. No significant stenosis, proximal occlusion, aneurysm, or vascular malformation. Posterior circulation: RIGHT vertebral dominant. No significant stenosis, proximal occlusion, aneurysm, or vascular malformation. Venous sinuses: As permitted by contrast timing, patent. Anatomic variants: None of significance. : No features to suggest large vessel or significant MCA branch occlusion. Atrophy and small vessel disease. Non stenotic and predominately calcific atheromatous change at the carotid bifurcations and also the carotid siphons. Cervical spondylosis. Chronic lung disease. Critical Value/emergent results were called by telephone at the time of interpretation on 05/22/2015 at 2:00 pm to Dr. Alexis Goodell , who verbally acknowledged these results. Electronically Signed   By: Staci Righter M.D.   On: 05/22/2015 14:13   Mr Brain Wo Contrast  05/22/2015  CLINICAL DATA:  Acute onset of unresponsiveness and aphasia without extremity weakness earlier today. EXAM:  MRI HEAD WITHOUT CONTRAST MRA HEAD WITHOUT CONTRAST TECHNIQUE: Multiplanar, multiecho pulse sequences of the brain and surrounding structures were obtained without intravenous contrast. Angiographic images of the head were obtained using MRA technique without contrast. COMPARISON:  CT angiography head and neck as well as noncontrast CT from earlier today. FINDINGS: MRI HEAD FINDINGS No evidence for acute infarction, hemorrhage, mass lesion, hydrocephalus, or extra-axial fluid. Generalized atrophy. Moderately advanced chronic microvascular ischemic change.  Advanced spondylosis. No pituitary or cerebellar tonsil abnormality. Extracranial soft tissues unremarkable. MRA HEAD FINDINGS The internal carotid arteries and basilar artery are widely patent. RIGHT vertebral dominant. No intracranial stenosis or aneurysm. IMPRESSION: No acute intracranial findings. No vascular occlusion or proximal flow reducing lesion. Electronically Signed   By: Staci Righter M.D.   On: 05/22/2015 18:05   Mr Jodene Nam Head/brain Wo Cm  05/22/2015  CLINICAL DATA:  Acute onset of unresponsiveness and aphasia without extremity weakness earlier today. EXAM: MRI HEAD WITHOUT CONTRAST MRA HEAD WITHOUT CONTRAST TECHNIQUE: Multiplanar, multiecho pulse sequences of the brain and surrounding structures were obtained without intravenous contrast. Angiographic images of the head were obtained using MRA technique without contrast. COMPARISON:  CT angiography head and neck as well as noncontrast CT from earlier today. FINDINGS: MRI HEAD FINDINGS No evidence for acute infarction, hemorrhage, mass lesion, hydrocephalus, or extra-axial fluid. Generalized atrophy. Moderately advanced chronic microvascular ischemic change. Advanced spondylosis. No pituitary or cerebellar tonsil abnormality. Extracranial soft tissues unremarkable. MRA HEAD FINDINGS The internal carotid arteries and basilar artery are widely patent. RIGHT vertebral dominant. No intracranial stenosis or aneurysm. IMPRESSION: No acute intracranial findings. No vascular occlusion or proximal flow reducing lesion. Electronically Signed   By: Staci Righter M.D.   On: 05/22/2015 18:05   Medications: I have reviewed the patient's current medications. Scheduled Meds: . aspirin  325 mg Oral Daily  . atorvastatin  40 mg Oral q1800  . enoxaparin (LOVENOX) injection  40 mg Subcutaneous Q24H  . multivitamin with minerals  1 tablet Oral Daily  . potassium chloride  40 mEq Oral Once   Continuous Infusions:  PRN  Meds:.senna-docusate Assessment/Plan: Principal Problem:   Stroke-like symptoms Active Problems:   Hypertension   Hyperlipidemia  Ms. Floberg is a 74 yo female with HTN presenting with stroke-like symtpoms.  Seizure vs TIA: Patient's first couple staring spells concerning for absence seizures. However, they would be abnormal in a patient of her age without previous h/o seizure. The episode leading to her current presentation, however, is more concerning for more generalized seizure (shaking, loss of posture). These seizures may be precipitated by chronic ischemic disease in the setting of longstanding untreated HTN. Weakness noted on presentation to ED has resolved. CT brain showed mild atrophy with no acute stroke.CTA with calcific atheromatous change at carotid bifurcations.  MRI with chronic microvascular disease without acute stroke.  Patient will need rest of TIA work up and EEG to evaluate for possible seizures. [ ]  A1c  - Lipids elevated. - Atorvastatin 40 mg daily [ ]  Echo [ ]  Carotid dopplers - PT/OT/SLP - ASA 325 daily - [place] [ ]  EEG  Hypokalemia: With K 2.9, it is concerning for possible cardiac etiology of TIA like symptoms. Patient denies symptoms and has no known cardiac history. Will monitor on teley and replete K. - K repletion - Mg 1.8 - Te[place]  FEN/GI: - Regular diet if passes swallow  DVT Ppx: Lovenox  Dispo: Disposition is deferred at this time, awaiting improvement of current medical problems.  Anticipated discharge  in approximately 1-2 day(s).   The patient does not have a current PCP (No Pcp Per Patient) and does need an [facility] hospital follow-up appointment after discharge.  The patient does not have transportation limitations that hinder transportation to clinic appointments.  .Services Needed at time of discharge: Y = Yes, Blank = No PT:   OT:   RN:   Equipment:   Other:     LOS: 1 day   Iline Oven, MD 05/23/2015, 11:24 AM

## 2015-05-23 NOTE — Progress Notes (Signed)
  Date: 05/23/2015  Patient name: Caitlin Cervantes  Medical record number: [id number]  Date of birth: [birth year]   I have seen and evaluated Caitlin Cervantes and discussed their care with the Residency Team. Caitlin Cervantes is a 74 yo who has not seen a physician in long time but has been told that she had HTN in the past and was on a BP med that made her pee but gave her no other SE. Her only meds are an ASA, krill oil, and some allergy meds as needed. She had returned from grocery shopping and her son came into the kitchen and noted that she had a blank stare and was not responsive. She had drooling of her L mouth and arm shaking B. 911 was called and in route she regained consciouness, had aphasia, and was agitated and got ativan in the ED. At time of admitting team's H&P, she had normalized. She has had 2 other prior episodes in past 2 months.   Today, she has no complaints and does not remember the episode.  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  Soc Hx : drives and able to run all of her own errands.  Filed Vitals:   05/23/15 0946  BP: 160/90  Pulse: 89  Temp: 98.5 F (36.9 C)  Resp: 20  Gen : lying in bed alert and NAD Neruo exam per Dr Lovena Le nl HRRR no MRG L coarse BS B ABD + BS Ext no edema  Assessment and Plan: I have seen and evaluated the patient as outlined above. I agree with the formulated Assessment and Plan as detailed in the residents' admission note, with the following changes:   1. Presumed TIA - her CT and MRI did not show any acute ischemic changes and therefore, R/O for CVA. TIA is possible since she did have aphasia and 4/5 RUE weakness on neuro's initial exam. Typical CVA / TIA W/U is pending. She will be cont on her ASA but a mod intensity statin will be added (LDL 157). Permissive HTN for now but will eventually need an antiHTN med added and she is willing to take.   2. Possible sz d/o - EEG is pending although sens is typically low  and she is now approaching 24 hrs post possible sz. If the EEG is +, then the dx of epilepsy is likely but if negative does not rule it out. This will be an important dx to make as she drives and she understands the importance of not driving if she has been found to have a sz d/o.  3. Hypokalemia - uncertain as to etiology. She does have HTN and hypoaldo can cause hypokalemia but no reason to W/U at this time as no indication that she has uncontrollable HTN and she is not a surgical candidate. RTA's also possible. Will replete and follow.  4. HTN - will start pharmacologic tx tomorrow.   She is willing to F/U [facility].  Caitlin Crews, MD 10/30/201610:23 AM

## 2015-05-23 NOTE — Discharge Summary (Signed)
Name: Caitlin Cervantes MRN: [id number] DOB: [birth year] 74 y.o. PCP: No Pcp Per Patient  Date of Admission: 05/22/2015 12:50 PM Date of Discharge: 05/24/2015 Attending Physician: No att. providers found  Discharge Diagnosis: 1. TIA vs Seizure   Principal Problem:   Stroke-like symptoms Active Problems:   Hypertension   Hyperlipidemia   Seizures (HCC)  Discharge Medications:   Medication List    TAKE these medications        aspirin EC 81 MG tablet  Take 81 mg by mouth at bedtime.     atorvastatin 40 MG tablet  Commonly known as:  LIPITOR  Take 1 tablet (40 mg total) by mouth daily at 6 PM.     hydrochlorothiazide 12.5 MG capsule  Commonly known as:  MICROZIDE  Take 1 capsule (12.5 mg total) by mouth daily.     KRILL OIL PO  Take 1 capsule by mouth at bedtime.     levETIRAcetam 500 MG tablet  Commonly known as:  KEPPRA  Take 1 tablet (500 mg total) by mouth 2 (two) times daily.     loratadine 10 MG tablet  Commonly known as:  CLARITIN  Take 1 tablet (10 mg total) by mouth daily.        Disposition and follow-up:   [name] was discharged from [facility] in Good condition.  At the hospital follow up visit please address:  1.  Medication adherence, repeat episodes of TIA or seizure, whether patient is driving  2.  Labs / imaging needed at time of follow-up: BMP  3.  Pending labs/ test needing follow-up: none  Follow-up Appointments:   Discharge Instructions: Discharge Instructions    Ambulatory referral to Neurology    Complete by:  As directed   An appointment is requested in approximately: 2 weeks     Diet - low sodium heart healthy    Complete by:  As directed      Increase activity slowly    Complete by:  As directed            Consultations: Treatment Team:  Catarina Hartshorn, MD  Procedures Performed:  Ct Angio Head W/cm &/or Wo Cm  05/22/2015  CLINICAL DATA:  Acute onset of unresponsiveness and aphasia  without extremity weakness earlier today. Last seen normal at 12 noon. EXAM: CT ANGIOGRAPHY HEAD AND NECK TECHNIQUE: Multidetector CT imaging of the head and neck was performed using the standard protocol during bolus administration of intravenous contrast. Multiplanar CT image reconstructions and MIPs were obtained to evaluate the vascular anatomy. Carotid stenosis measurements (when applicable) are obtained utilizing NASCET criteria, using the distal internal carotid diameter as the denominator. CONTRAST:  105mL OMNIPAQUE IOHEXOL 350 MG/ML SOLN COMPARISON:  CT head earlier today. FINDINGS: CT HEAD Calvarium and skull base: No fracture or destructive lesion. Mastoids and middle ears are grossly clear. Paranasal sinuses: Slight layering fluid in both divisions of the sphenoid of uncertain significance. Orbits: Negative. Brain: No evidence of acute abnormality, including acute infarct, hemorrhage, hydrocephalus, or mass lesion. Mild atrophy. Chronic microvascular ischemic change. CTA NECK Aortic arch: Standard branching. Imaged portion shows no evidence of aneurysm or dissection. No significant stenosis of the major arch vessel origins. Right carotid system: Fairly dense but non stenotic calcific plaque at the RIGHT ICA origin. Markedly dolichoectatic cervical ICA is retropharyngeal as is the LEFT, so-called kissing carotids. No evidence of dissection, stenosis (50% or greater) or occlusion. Left carotid system: Fairly dense but non stenotic mostly  calcific plaque at the LEFT ICA origin. No evidence of dissection, stenosis (50% or greater) or occlusion. Vertebral arteries: RIGHT dominant. No evidence of dissection, stenosis (50% or greater) or occlusion. Nonvascular soft tissues: No neck masses. Severe cervical spondylosis with degenerative scoliosis convex RIGHT. Dental caries. Chronic scarring in the lung apices, with moderate LEFT-sided pleural thickening, and posterior segment RIGHT upper lobe bronchiectasis,  similar to prior CT from 05/19/2008. CTA HEAD Anterior circulation: Non stenotic calcific plaque throughout the cavernous and supraclinoid ICA segments. No significant A1 or M1 MCA disease. No MCA branch occlusion. No significant stenosis, proximal occlusion, aneurysm, or vascular malformation. Posterior circulation: RIGHT vertebral dominant. No significant stenosis, proximal occlusion, aneurysm, or vascular malformation. Venous sinuses: As permitted by contrast timing, patent. Anatomic variants: None of significance. : No features to suggest large vessel or significant MCA branch occlusion. Atrophy and small vessel disease. Non stenotic and predominately calcific atheromatous change at the carotid bifurcations and also the carotid siphons. Cervical spondylosis. Chronic lung disease. Critical Value/emergent results were called by telephone at the time of interpretation on 05/22/2015 at 2:00 pm to Dr. Alexis Goodell , who verbally acknowledged these results. Electronically Signed   By: Staci Righter M.D.   On: 05/22/2015 14:13   Ct Head Wo Contrast  05/22/2015  CLINICAL DATA:  CODE STROKE,  APHASIC EXAM: CT HEAD WITHOUT CONTRAST TECHNIQUE: Contiguous axial images were obtained from the base of the skull through the vertex without intravenous contrast. COMPARISON:  None. FINDINGS: Atherosclerotic and physiologic intracranial calcifications. Bilateral basal ganglia mineralization. Mild parenchymal atrophy. Moderate areas of hypoattenuation in deep and periventricular white matter bilaterally. Negative for acute intracranial hemorrhage, mass lesion, acute infarction, midline shift, or mass-effect. Acute infarct may be inapparent on noncontrast CT. Ventricles and sulci symmetric. Bone windows demonstrate no focal lesion. IMPRESSION: 1. Negative for bleed or other acute intracranial process. 2. Atrophy and nonspecific white matter changes. Critical Value/emergent results were called by telephone at the time of  interpretation on 05/22/2015 at 1:21 pm to Dr. Doy Mince, who verbally acknowledged these results. Electronically Signed   By: Lucrezia Europe M.D.   On: 05/22/2015 13:21   Ct Angio Neck W/cm &/or Wo/cm  05/22/2015  CLINICAL DATA:  Acute onset of unresponsiveness and aphasia without extremity weakness earlier today. Last seen normal at 12 noon. EXAM: CT ANGIOGRAPHY HEAD AND NECK TECHNIQUE: Multidetector CT imaging of the head and neck was performed using the standard protocol during bolus administration of intravenous contrast. Multiplanar CT image reconstructions and MIPs were obtained to evaluate the vascular anatomy. Carotid stenosis measurements (when applicable) are obtained utilizing NASCET criteria, using the distal internal carotid diameter as the denominator. CONTRAST:  33mL OMNIPAQUE IOHEXOL 350 MG/ML SOLN COMPARISON:  CT head earlier today. FINDINGS: CT HEAD Calvarium and skull base: No fracture or destructive lesion. Mastoids and middle ears are grossly clear. Paranasal sinuses: Slight layering fluid in both divisions of the sphenoid of uncertain significance. Orbits: Negative. Brain: No evidence of acute abnormality, including acute infarct, hemorrhage, hydrocephalus, or mass lesion. Mild atrophy. Chronic microvascular ischemic change. CTA NECK Aortic arch: Standard branching. Imaged portion shows no evidence of aneurysm or dissection. No significant stenosis of the major arch vessel origins. Right carotid system: Fairly dense but non stenotic calcific plaque at the RIGHT ICA origin. Markedly dolichoectatic cervical ICA is retropharyngeal as is the LEFT, so-called kissing carotids. No evidence of dissection, stenosis (50% or greater) or occlusion. Left carotid system: Fairly dense but non stenotic mostly calcific plaque at  the LEFT ICA origin. No evidence of dissection, stenosis (50% or greater) or occlusion. Vertebral arteries: RIGHT dominant. No evidence of dissection, stenosis (50% or greater) or  occlusion. Nonvascular soft tissues: No neck masses. Severe cervical spondylosis with degenerative scoliosis convex RIGHT. Dental caries. Chronic scarring in the lung apices, with moderate LEFT-sided pleural thickening, and posterior segment RIGHT upper lobe bronchiectasis, similar to prior CT from 05/19/2008. CTA HEAD Anterior circulation: Non stenotic calcific plaque throughout the cavernous and supraclinoid ICA segments. No significant A1 or M1 MCA disease. No MCA branch occlusion. No significant stenosis, proximal occlusion, aneurysm, or vascular malformation. Posterior circulation: RIGHT vertebral dominant. No significant stenosis, proximal occlusion, aneurysm, or vascular malformation. Venous sinuses: As permitted by contrast timing, patent. Anatomic variants: None of significance. : No features to suggest large vessel or significant MCA branch occlusion. Atrophy and small vessel disease. Non stenotic and predominately calcific atheromatous change at the carotid bifurcations and also the carotid siphons. Cervical spondylosis. Chronic lung disease. Critical Value/emergent results were called by telephone at the time of interpretation on 05/22/2015 at 2:00 pm to Dr. Alexis Goodell , who verbally acknowledged these results. Electronically Signed   By: Staci Righter M.D.   On: 05/22/2015 14:13   Mr Brain Wo Contrast  05/22/2015  CLINICAL DATA:  Acute onset of unresponsiveness and aphasia without extremity weakness earlier today. EXAM: MRI HEAD WITHOUT CONTRAST MRA HEAD WITHOUT CONTRAST TECHNIQUE: Multiplanar, multiecho pulse sequences of the brain and surrounding structures were obtained without intravenous contrast. Angiographic images of the head were obtained using MRA technique without contrast. COMPARISON:  CT angiography head and neck as well as noncontrast CT from earlier today. FINDINGS: MRI HEAD FINDINGS No evidence for acute infarction, hemorrhage, mass lesion, hydrocephalus, or extra-axial fluid.  Generalized atrophy. Moderately advanced chronic microvascular ischemic change. Advanced spondylosis. No pituitary or cerebellar tonsil abnormality. Extracranial soft tissues unremarkable. MRA HEAD FINDINGS The internal carotid arteries and basilar artery are widely patent. RIGHT vertebral dominant. No intracranial stenosis or aneurysm. IMPRESSION: No acute intracranial findings. No vascular occlusion or proximal flow reducing lesion. Electronically Signed   By: Staci Righter M.D.   On: 05/22/2015 18:05   Mr Jodene Nam Head/brain Wo Cm  05/22/2015  CLINICAL DATA:  Acute onset of unresponsiveness and aphasia without extremity weakness earlier today. EXAM: MRI HEAD WITHOUT CONTRAST MRA HEAD WITHOUT CONTRAST TECHNIQUE: Multiplanar, multiecho pulse sequences of the brain and surrounding structures were obtained without intravenous contrast. Angiographic images of the head were obtained using MRA technique without contrast. COMPARISON:  CT angiography head and neck as well as noncontrast CT from earlier today. FINDINGS: MRI HEAD FINDINGS No evidence for acute infarction, hemorrhage, mass lesion, hydrocephalus, or extra-axial fluid. Generalized atrophy. Moderately advanced chronic microvascular ischemic change. Advanced spondylosis. No pituitary or cerebellar tonsil abnormality. Extracranial soft tissues unremarkable. MRA HEAD FINDINGS The internal carotid arteries and basilar artery are widely patent. RIGHT vertebral dominant. No intracranial stenosis or aneurysm. IMPRESSION: No acute intracranial findings. No vascular occlusion or proximal flow reducing lesion. Electronically Signed   By: Staci Righter M.D.   On: 05/22/2015 18:05   EEG 05/24/15 Description: This EEG recording was performed during wakefulness and during drowsiness. Predominant background activity during wakefulness consisted of 9 Hz symmetrical alpha rhythm with fairly good attenuation with eye opening. Photic stimulation produced a minimal bilateral  occipital driving response. Hyperventilation was not performed. Several occurrences of moderate to high amplitude somewhat rhythmic generalized delta activity were recorded lasting 3-4 second. No frank epileptiform discharges were  recorded.  Interpretation: This EEG recording is essentially normal during wakefulness and drowsiness. The significance of brief runs of generalized delta activity is unclear, and do not in and of themselves manifestations epileptic activity. The lack of frank epileptiform discharges does not rule out seizure disorder, however.  2D Echo: Study Conclusions  - Left ventricle: The cavity size was normal. There was moderate concentric hypertrophy. Systolic function was normal. The estimated ejection fraction was in the range of 60% to 65%. Wall motion was normal; there were no regional wall motion abnormalities. Doppler parameters are consistent with abnormal left ventricular relaxation (grade 1 diastolic dysfunction). - Mitral valve: There was mild regurgitation.  Cardiac Cath:   Admission HPI: Ms. Schmied is a 74 yo female with HTN, presenting as code stroke. Patient was at home this morning in her usual state of health, when her son noticed that she had a blank stare, was nonresponsive, drooling of her left mouth, and then started to have low amplitude shaking of her arms. She started to slump over and she was caught by her son. She was evaluated in the ED, with initial NIHSS of 4. She returned to her baseline by the end of exam and tPA was not administered. She has had two similar episodes in the last 2 months. They are reported as staring spells in which the patient is nonresponsive, but maintains her posture while sitting or standing. There is no nystagums. No loss of bowel or bladder control. She does not remember the episodes. She has no history of seizure or stroke.  She has a history of HTN, but does not see a doctor or take any medications. She  has never been told she has DM or HLD. She does not smoke or drink alcohol. She has a family h/o DM, HTN, and HLD.   She lives with her son and is able to perform all her ADLs and IADLs.   Hospital Course by problem list: Principal Problem:   Stroke-like symptoms Active Problems:   Hypertension   Hyperlipidemia   Seizures (HCC)   TIA/Seizure: Patient presented with NIHSS of 4, which resolved by the end of her evaluation by Neurology. tPA was not administered.  By the next morning, she was pleasant, alert and oriented x3, with normal neurologic exam.  EEG demonstrated brief runs of generalized delta activity and do not in and of themselves manifestations epileptic activity. The lack of frank epileptiform discharges does not rule out seizure disorder, however.  Lipids were elevated and patient was started on Atorvastatin 40 mg daily. She was discharged on ASA 81 mg, Atorvastatin 40 mg, and HCTZ 12.5mg .  With concern for secondary generalization of focal seizure, she was started on Keppra 500 mg BID.  She agrees to follow up in [facility] for primary care.  She was advised not to drive for 6 months due to her risk of repeat nonresponsive episodes.  Patient and son expressed understanding and agreement with that plan.  Hypertension: Patient had past diagnosis of HTN, which was untreated due to patient aversion to medical doctors.  She was counseled on the risks of untreated HTN and she agreed to follow up in [facility] as well as medication adherence.  Discharge Vitals:   BP 160/94 mmHg  Pulse 88  Temp(Src) 98.9 F (37.2 C) (Oral)  Resp 20  Ht 5\' 1"  (1.549 m)  Wt 106 lb 9.6 oz (48.353 kg)  BMI 20.15 kg/m2  SpO2 100%  LMP  (LMP Unknown)  Discharge Labs:  Results for orders placed or performed during the hospital encounter of 05/22/15 (from the past 24 hour(s))  Basic metabolic panel     Status: Abnormal   Collection Time: 05/24/15 10:25 AM  Result Value Ref Range   Sodium 140 135 - 145 mmol/L    Potassium 4.0 3.5 - 5.1 mmol/L   Chloride 99 (L) 101 - 111 mmol/L   CO2 28 22 - 32 mmol/L   Glucose, Bld 110 (H) 65 - 99 mg/dL   BUN 8 6 - 20 mg/dL   Creatinine, Ser 0.85 0.44 - 1.00 mg/dL   Calcium 10.4 (H) 8.9 - 10.3 mg/dL   GFR calc non Af Amer >60 >60 mL/min   GFR calc Af Amer >60 >60 mL/min   Anion gap 13 5 - 15    Signed: Iline Oven, MD 05/24/2015, 7:17 PM    Services Ordered on Discharge: none Equipment Ordered on Discharge: none

## 2015-05-24 ENCOUNTER — Inpatient Hospital Stay (HOSPITAL_COMMUNITY)
Admit: 2015-05-24 | Discharge: 2015-05-24 | Disposition: A | Discharge status: Home / Self Care | Payer: Medicare Other | Attending: Internal Medicine | Admitting: Internal Medicine

## 2015-05-24 ENCOUNTER — Inpatient Hospital Stay (HOSPITAL_COMMUNITY): Payer: Medicare Other

## 2015-05-24 DIAGNOSIS — I6789 Other cerebrovascular disease: Secondary | ICD-10-CM

## 2015-05-24 DIAGNOSIS — R569 Unspecified convulsions: Secondary | ICD-10-CM

## 2015-05-24 LAB — BASIC METABOLIC PANEL
Anion gap: 13 (ref 5–15)
BUN: 8 mg/dL (ref 6–20)
CALCIUM: 10.4 mg/dL — AB (ref 8.9–10.3)
CO2: 28 mmol/L (ref 22–32)
CREATININE: 0.85 mg/dL (ref 0.44–1.00)
Chloride: 99 mmol/L — ABNORMAL LOW (ref 101–111)
Glucose, Bld: 110 mg/dL — ABNORMAL HIGH (ref 65–99)
Potassium: 4 mmol/L (ref 3.5–5.1)
SODIUM: 140 mmol/L (ref 135–145)

## 2015-05-24 LAB — HEMOGLOBIN A1C
HEMOGLOBIN A1C: 6.1 % — AB (ref 4.8–5.6)
MEAN PLASMA GLUCOSE: 128 mg/dL

## 2015-05-24 MED ORDER — LEVETIRACETAM 500 MG PO TABS: 500.0000 mg | ORAL_TABLET | Freq: Two times a day (BID) | ORAL | Status: DC

## 2015-05-24 MED ORDER — HYDROCHLOROTHIAZIDE 12.5 MG PO CAPS: 12.5000 mg | ORAL_CAPSULE | Freq: Every day | ORAL | Status: DC

## 2015-05-24 MED ORDER — ENOXAPARIN SODIUM 30 MG/0.3ML ~~LOC~~ SOLN: 30.0000 mg | SUBCUTANEOUS | Status: DC

## 2015-05-24 MED ORDER — ATORVASTATIN CALCIUM 40 MG PO TABS: 40.0000 mg | ORAL_TABLET | Freq: Every day | ORAL | Status: DC

## 2015-05-24 MED ORDER — LORATADINE 10 MG PO TABS: 10.0000 mg | ORAL_TABLET | Freq: Every day | ORAL | Status: DC

## 2015-05-24 MED ORDER — LORATADINE 10 MG PO TABS
10.0000 mg | ORAL_TABLET | Freq: Every day | ORAL | Status: DC
  Administered 2015-05-24: 10 mg via ORAL
  Filled 2015-05-24: qty 1

## 2015-05-24 MED ORDER — HYDROCHLOROTHIAZIDE 12.5 MG PO CAPS
12.5000 mg | ORAL_CAPSULE | Freq: Every day | ORAL | Status: DC
  Administered 2015-05-24: 12.5 mg via ORAL
  Filled 2015-05-24: qty 1

## 2015-05-24 NOTE — Procedures (Signed)
ELECTROENCEPHALOGRAM REPORT  Patient: Caitlin Cervantes       Room #:5M01 EEG No. ID: [id number] Age: 74 y.o.        Sex: female Referring Physician: Evette Doffing, D Report Date:  05/24/2015        Interpreting Physician: Anthony Sar  History: [name] is an 73 y.o. female with a history of recent episodes of loss of consciousness with tonic-clonic seizure-like activity.  Indications for study:  Rule out seizure disorder.  Technique: This is an 18 channel routine scalp EEG performed at the bedside with bipolar and monopolar montages arranged in accordance to the international 10/20 system of electrode placement.   Description: This EEG recording was performed during wakefulness and during drowsiness. Predominant background activity during wakefulness consisted of 9 Hz symmetrical alpha rhythm with fairly good attenuation with eye opening. Photic stimulation produced a minimal bilateral occipital driving response. Hyperventilation was not performed. Several occurrences of moderate to high amplitude somewhat rhythmic generalized delta activity were recorded lasting 3-4 second. No frank epileptiform discharges were recorded.  Interpretation: This EEG recording is essentially normal during wakefulness and drowsiness. The significance of brief runs of generalized delta activity is unclear, and do not in and of themselves manifestations epileptic activity. The lack of frank epileptiform discharges does not rule out seizure disorder, however.   Rush Farmer M.D. [facility] [phone]

## 2015-05-24 NOTE — Progress Notes (Signed)
*  PRELIMINARY RESULTS* Echocardiogram 2D Echocardiogram has been performed.  Leavy Cella 05/24/2015, 12:16 PM

## 2015-05-24 NOTE — Progress Notes (Signed)
Subjective: NAEON. Patient alert and oriented x3 this morning.  She feels completely normal without complaint.  She does not recall most of the day yesterday, remembering only that she had gone to the grocery store and then returned home.  Objective: Vital signs in last 24 hours: Filed Vitals:   05/23/15 2007 05/23/15 2200 05/24/15 0231 05/24/15 0546  BP: 177/97 171/93 188/94 167/105  Pulse: 81 79 75 77  Temp:  97.8 F (36.6 C) 98.2 F (36.8 C) 98 F (36.7 C)  TempSrc:  Oral Oral Oral  Resp:  18 20 20   Height:      Weight:      SpO2:  100% 100% 100%   Weight change:   Intake/Output Summary (Last 24 hours) at 05/24/15 0841 Last data filed at 05/23/15 0947  Gross per 24 hour  Intake    240 ml  Output      0 ml  Net    240 ml   Physical Exam  Constitutional: She is oriented to person, place, and time.  Pleasant, elderly female, lying in bed, no distress.  HENT:  Head: Normocephalic and atraumatic.  Eyes: EOM are normal.  Neck: No JVD present. No tracheal deviation present.  Cardiovascular: Normal rate, regular rhythm, normal heart sounds and intact distal pulses.   Pulmonary/Chest: Effort normal. No respiratory distress.  Minimal scattered wheezing.  Abdominal: Soft. She exhibits no distension. There is no tenderness. There is no rebound and no guarding.  Musculoskeletal: Normal range of motion. She exhibits no edema.  Neurological: She is alert and oriented to person, place, and time.    Lab Results: Basic Metabolic Panel:  Recent Labs Lab 05/22/15 1308 05/22/15 1315 05/22/15 1856 05/23/15 0626  NA 136 139  --  136  K 2.9* 2.8*  --  3.3*  CL 98* 100*  --  100*  CO2 20*  --   --  29  GLUCOSE 174* 168*  --  92  BUN 14 16  --  9  CREATININE 0.96 0.80  --  0.76  CALCIUM 9.3  --   --  9.4  MG  --   --  1.8  --    Liver Function Tests:  Recent Labs Lab 05/22/15 1308  AST 34  ALT 13*  ALKPHOS 100  BILITOT 0.3  PROT 7.5  ALBUMIN 3.8   No results for  input(s): LIPASE, AMYLASE in the last 168 hours. No results for input(s): AMMONIA in the last 168 hours. CBC:  Recent Labs Lab 05/22/15 1308 05/22/15 1315  WBC 8.3  --   NEUTROABS 3.0  --   HGB 12.0 13.6  HCT 36.2 40.0  MCV 83.8  --   PLT 402*  --    Cardiac Enzymes:  Recent Labs Lab 05/23/15 0626  TROPONINI <0.03   BNP: No results for input(s): PROBNP in the last 168 hours. D-Dimer: No results for input(s): DDIMER in the last 168 hours. CBG:  Recent Labs Lab 05/22/15 1308  GLUCAP 162*   Hemoglobin A1C: No results for input(s): HGBA1C in the last 168 hours. Fasting Lipid Panel:  Recent Labs Lab 05/23/15 0626  CHOL 232*  HDL 59  LDLCALC 157*  TRIG 81  CHOLHDL 3.9   Thyroid Function Tests:  Recent Labs Lab 05/22/15 2240  TSH 0.385   Coagulation:  Recent Labs Lab 05/22/15 1308  LABPROT 13.7  INR 1.03   Anemia Panel: No results for input(s): VITAMINB12, FOLATE, FERRITIN, TIBC, IRON, RETICCTPCT in the last  168 hours. Urine Drug Screen: Drugs of Abuse     Component Value Date/Time   LABOPIA NONE DETECTED 05/22/2015 1555   COCAINSCRNUR NONE DETECTED 05/22/2015 1555   LABBENZ NONE DETECTED 05/22/2015 1555   AMPHETMU NONE DETECTED 05/22/2015 1555   THCU NONE DETECTED 05/22/2015 1555   LABBARB NONE DETECTED 05/22/2015 1555    Alcohol Level:  Recent Labs Lab 05/22/15 1308  ETH <5   Urinalysis:  Recent Labs Lab 05/22/15 [place] 6.0  GLUCOSEU NEGATIVE  HGBUR NEGATIVE  BILIRUBINUR NEGATIVE  KETONESUR NEGATIVE  PROTEINUR NEGATIVE  UROBILINOGEN 0.2  NITRITE NEGATIVE  LEUKOCYTESUR TRACE*   Misc. Labs:   Micro Results: No results found for this or any previous visit (from the past 240 hour(s)). Studies/Results: Ct Angio Head W/cm &/or Wo Cm  05/22/2015  CLINICAL DATA:  Acute onset of unresponsiveness and aphasia without extremity weakness earlier today. Last seen normal at 12 noon. EXAM:  CT ANGIOGRAPHY HEAD AND NECK TECHNIQUE: Multidetector CT imaging of the head and neck was performed using the standard protocol during bolus administration of intravenous contrast. Multiplanar CT image reconstructions and MIPs were obtained to evaluate the vascular anatomy. Carotid stenosis measurements (when applicable) are obtained utilizing NASCET criteria, using the distal internal carotid diameter as the denominator. CONTRAST:  85mL OMNIPAQUE IOHEXOL 350 MG/ML SOLN COMPARISON:  CT head earlier today. FINDINGS: CT HEAD Calvarium and skull base: No fracture or destructive lesion. Mastoids and middle ears are grossly clear. Paranasal sinuses: Slight layering fluid in both divisions of the sphenoid of uncertain significance. Orbits: Negative. Brain: No evidence of acute abnormality, including acute infarct, hemorrhage, hydrocephalus, or mass lesion. Mild atrophy. Chronic microvascular ischemic change. CTA NECK Aortic arch: Standard branching. Imaged portion shows no evidence of aneurysm or dissection. No significant stenosis of the major arch vessel origins. Right carotid system: Fairly dense but non stenotic calcific plaque at the RIGHT ICA origin. Markedly dolichoectatic cervical ICA is retropharyngeal as is the LEFT, so-called kissing carotids. No evidence of dissection, stenosis (50% or greater) or occlusion. Left carotid system: Fairly dense but non stenotic mostly calcific plaque at the LEFT ICA origin. No evidence of dissection, stenosis (50% or greater) or occlusion. Vertebral arteries: RIGHT dominant. No evidence of dissection, stenosis (50% or greater) or occlusion. Nonvascular soft tissues: No neck masses. Severe cervical spondylosis with degenerative scoliosis convex RIGHT. Dental caries. Chronic scarring in the lung apices, with moderate LEFT-sided pleural thickening, and posterior segment RIGHT upper lobe bronchiectasis, similar to prior CT from 05/19/2008. CTA HEAD Anterior circulation: Non stenotic  calcific plaque throughout the cavernous and supraclinoid ICA segments. No significant A1 or M1 MCA disease. No MCA branch occlusion. No significant stenosis, proximal occlusion, aneurysm, or vascular malformation. Posterior circulation: RIGHT vertebral dominant. No significant stenosis, proximal occlusion, aneurysm, or vascular malformation. Venous sinuses: As permitted by contrast timing, patent. Anatomic variants: None of significance. : No features to suggest large vessel or significant MCA branch occlusion. Atrophy and small vessel disease. Non stenotic and predominately calcific atheromatous change at the carotid bifurcations and also the carotid siphons. Cervical spondylosis. Chronic lung disease. Critical Value/emergent results were called by telephone at the time of interpretation on 05/22/2015 at 2:00 pm to Dr. Alexis Goodell , who verbally acknowledged these results. Electronically Signed   By: Staci Righter M.D.   On: 05/22/2015 14:13   Ct Head Wo Contrast  05/22/2015  CLINICAL DATA:  CODE STROKE,  APHASIC EXAM: CT HEAD WITHOUT CONTRAST  TECHNIQUE: Contiguous axial images were obtained from the base of the skull through the vertex without intravenous contrast. COMPARISON:  None. FINDINGS: Atherosclerotic and physiologic intracranial calcifications. Bilateral basal ganglia mineralization. Mild parenchymal atrophy. Moderate areas of hypoattenuation in deep and periventricular white matter bilaterally. Negative for acute intracranial hemorrhage, mass lesion, acute infarction, midline shift, or mass-effect. Acute infarct may be inapparent on noncontrast CT. Ventricles and sulci symmetric. Bone windows demonstrate no focal lesion. IMPRESSION: 1. Negative for bleed or other acute intracranial process. 2. Atrophy and nonspecific white matter changes. Critical Value/emergent results were called by telephone at the time of interpretation on 05/22/2015 at 1:21 pm to Dr. Doy Mince, who verbally acknowledged  these results. Electronically Signed   By: Lucrezia Europe M.D.   On: 05/22/2015 13:21   Ct Angio Neck W/cm &/or Wo/cm  05/22/2015  CLINICAL DATA:  Acute onset of unresponsiveness and aphasia without extremity weakness earlier today. Last seen normal at 12 noon. EXAM: CT ANGIOGRAPHY HEAD AND NECK TECHNIQUE: Multidetector CT imaging of the head and neck was performed using the standard protocol during bolus administration of intravenous contrast. Multiplanar CT image reconstructions and MIPs were obtained to evaluate the vascular anatomy. Carotid stenosis measurements (when applicable) are obtained utilizing NASCET criteria, using the distal internal carotid diameter as the denominator. CONTRAST:  48mL OMNIPAQUE IOHEXOL 350 MG/ML SOLN COMPARISON:  CT head earlier today. FINDINGS: CT HEAD Calvarium and skull base: No fracture or destructive lesion. Mastoids and middle ears are grossly clear. Paranasal sinuses: Slight layering fluid in both divisions of the sphenoid of uncertain significance. Orbits: Negative. Brain: No evidence of acute abnormality, including acute infarct, hemorrhage, hydrocephalus, or mass lesion. Mild atrophy. Chronic microvascular ischemic change. CTA NECK Aortic arch: Standard branching. Imaged portion shows no evidence of aneurysm or dissection. No significant stenosis of the major arch vessel origins. Right carotid system: Fairly dense but non stenotic calcific plaque at the RIGHT ICA origin. Markedly dolichoectatic cervical ICA is retropharyngeal as is the LEFT, so-called kissing carotids. No evidence of dissection, stenosis (50% or greater) or occlusion. Left carotid system: Fairly dense but non stenotic mostly calcific plaque at the LEFT ICA origin. No evidence of dissection, stenosis (50% or greater) or occlusion. Vertebral arteries: RIGHT dominant. No evidence of dissection, stenosis (50% or greater) or occlusion. Nonvascular soft tissues: No neck masses. Severe cervical spondylosis with  degenerative scoliosis convex RIGHT. Dental caries. Chronic scarring in the lung apices, with moderate LEFT-sided pleural thickening, and posterior segment RIGHT upper lobe bronchiectasis, similar to prior CT from 05/19/2008. CTA HEAD Anterior circulation: Non stenotic calcific plaque throughout the cavernous and supraclinoid ICA segments. No significant A1 or M1 MCA disease. No MCA branch occlusion. No significant stenosis, proximal occlusion, aneurysm, or vascular malformation. Posterior circulation: RIGHT vertebral dominant. No significant stenosis, proximal occlusion, aneurysm, or vascular malformation. Venous sinuses: As permitted by contrast timing, patent. Anatomic variants: None of significance. : No features to suggest large vessel or significant MCA branch occlusion. Atrophy and small vessel disease. Non stenotic and predominately calcific atheromatous change at the carotid bifurcations and also the carotid siphons. Cervical spondylosis. Chronic lung disease. Critical Value/emergent results were called by telephone at the time of interpretation on 05/22/2015 at 2:00 pm to Dr. Alexis Goodell , who verbally acknowledged these results. Electronically Signed   By: Staci Righter M.D.   On: 05/22/2015 14:13   Mr Brain Wo Contrast  05/22/2015  CLINICAL DATA:  Acute onset of unresponsiveness and aphasia without extremity weakness earlier today. EXAM:  MRI HEAD WITHOUT CONTRAST MRA HEAD WITHOUT CONTRAST TECHNIQUE: Multiplanar, multiecho pulse sequences of the brain and surrounding structures were obtained without intravenous contrast. Angiographic images of the head were obtained using MRA technique without contrast. COMPARISON:  CT angiography head and neck as well as noncontrast CT from earlier today. FINDINGS: MRI HEAD FINDINGS No evidence for acute infarction, hemorrhage, mass lesion, hydrocephalus, or extra-axial fluid. Generalized atrophy. Moderately advanced chronic microvascular ischemic change.  Advanced spondylosis. No pituitary or cerebellar tonsil abnormality. Extracranial soft tissues unremarkable. MRA HEAD FINDINGS The internal carotid arteries and basilar artery are widely patent. RIGHT vertebral dominant. No intracranial stenosis or aneurysm. IMPRESSION: No acute intracranial findings. No vascular occlusion or proximal flow reducing lesion. Electronically Signed   By: Staci Righter M.D.   On: 05/22/2015 18:05   Mr Jodene Nam Head/brain Wo Cm  05/22/2015  CLINICAL DATA:  Acute onset of unresponsiveness and aphasia without extremity weakness earlier today. EXAM: MRI HEAD WITHOUT CONTRAST MRA HEAD WITHOUT CONTRAST TECHNIQUE: Multiplanar, multiecho pulse sequences of the brain and surrounding structures were obtained without intravenous contrast. Angiographic images of the head were obtained using MRA technique without contrast. COMPARISON:  CT angiography head and neck as well as noncontrast CT from earlier today. FINDINGS: MRI HEAD FINDINGS No evidence for acute infarction, hemorrhage, mass lesion, hydrocephalus, or extra-axial fluid. Generalized atrophy. Moderately advanced chronic microvascular ischemic change. Advanced spondylosis. No pituitary or cerebellar tonsil abnormality. Extracranial soft tissues unremarkable. MRA HEAD FINDINGS The internal carotid arteries and basilar artery are widely patent. RIGHT vertebral dominant. No intracranial stenosis or aneurysm. IMPRESSION: No acute intracranial findings. No vascular occlusion or proximal flow reducing lesion. Electronically Signed   By: Staci Righter M.D.   On: 05/22/2015 18:05   Medications: I have reviewed the patient's current medications. Scheduled Meds: . aspirin  325 mg Oral Daily  . atorvastatin  40 mg Oral q1800  . enoxaparin (LOVENOX) injection  40 mg Subcutaneous Q24H  . hydrochlorothiazide  12.5 mg Oral Daily  . loratadine  10 mg Oral Daily  . multivitamin with minerals  1 tablet Oral Daily   Continuous Infusions:  PRN  Meds:.senna-docusate Assessment/Plan: Principal Problem:   Stroke-like symptoms Active Problems:   Hypertension   Hyperlipidemia  Ms. Moccia is a 74 yo female with HTN presenting with stroke-like symtpoms.  Seizure vs TIA: Patient's first couple staring spells concerning for absence seizures. However, they would be abnormal in a patient of her age without previous h/o seizure. The episode leading to her current presentation, however, is more concerning for more generalized seizure (shaking, loss of posture). These seizures may be precipitated by chronic ischemic disease in the setting of longstanding untreated HTN. Weakness noted on presentation to ED has resolved. CT brain showed mild atrophy with no acute stroke.CTA with calcific atheromatous change at carotid bifurcations (carotid dopplers cancelled as carotids were evaluated by CTA).  MRI with chronic microvascular disease without acute stroke.  EEG: "Photic stimulation produced a minimal bilateral occipital driving response. Hyperventilation was not performed. Several occurrences of moderate to high amplitude somewhat rhythmic generalized delta activity were recorded lasting 3-4 second. No frank epileptiform discharges were recorded. Interpretation: This EEG recording is essentially normal during wakefulness and drowsiness. The significance of brief runs of generalized delta activity is unclear, and do not in and of themselves manifestations epileptic activity. The lack of frank epileptiform discharges does not rule out seizure disorder, however." Patient will be advised not to drive due to her risk of seizure. - A1c 6.1%. -  Lipids elevated. - Atorvastatin 40 mg daily - HCTZ 12.5 mg daily [ ]  Echo - PT/OT/SLP - ASA 325 daily - [place]  Hypokalemia: With K 2.9, it is concerning for possible cardiac etiology of TIA like symptoms. Patient denies symptoms and has no known cardiac history. Will monitor on teley and replete K. - K  repletion - Mg 1.8 - Te[place]  FEN/GI: - HH  DVT Ppx: Lovenox  Dispo: Disposition is deferred at this time, awaiting improvement of current medical problems.  Anticipated discharge in approximately 1 day(s).   The patient does not have a current PCP (No Pcp Per Patient) and does need an [facility] hospital follow-up appointment after discharge.  The patient does not have transportation limitations that hinder transportation to clinic appointments.  .Services Needed at time of discharge: Y = Yes, Blank = No PT:   OT:   RN:   Equipment:   Other:     LOS: 2 days   Iline Oven, MD 05/24/2015, 8:41 AM

## 2015-05-24 NOTE — Care Management Note (Signed)
Case Management Note  Patient Details  Name: Caitlin Cervantes MRN: [id number] Date of Birth: 11/25/1940  Subjective/Objective:                    Action/Plan: Patient admitted with stroke like symptoms. MRI negative. Patient lives at home with family. CM met with the patient and she denies having a PCP. Patient does state that she is going to follow up with internal medicine clinic at discharge. CM checked the discharge AVS and patient has a follow up appointment with [facility]. CM will continue to follow for discharge needs.   Expected Discharge Date:                  Expected Discharge Plan:  Home/Self Care  In-House Referral:     Discharge planning Services     Post Acute Care Choice:    Choice offered to:     DME Arranged:    DME Agency:     HH Arranged:    HH Agency:     Status of Service:  In process, will continue to follow  Medicare Important Message Given:    Date Medicare IM Given:    Medicare IM give by:    Date Additional Medicare IM Given:    Additional Medicare Important Message give by:     If discussed at [place] of Stay Meetings, dates discussed:    Additional Comments:  Pollie Friar, RN 05/24/2015, 3:01 PM

## 2015-05-24 NOTE — Evaluation (Signed)
Occupational Therapy Evaluation Patient Details Name: Caitlin Cervantes MRN: [id number] DOB: 09-15-1940 Today's Date: 05/24/2015    History of Present Illness Pt is a 74 y.o. female admitted for stroke-like symptoms. PMH consists of HTN, but pt does not see a doctor or take meds.   Clinical Impression   Pt is functioning independently in ADL and mobility.  No cognitive or visual issues identified.  No further OT needs. Signing off.   Follow Up Recommendations  No OT follow up    Equipment Recommendations  None recommended by OT    Recommendations for Other Services       Precautions / Restrictions Precautions Precautions: None      Mobility Bed Mobility Overal bed mobility: Independent                Transfers Overall transfer level: Independent                    Balance                                            ADL Overall ADL's : Independent                                             Vision     Perception     Praxis      Pertinent Vitals/Pain Pain Assessment: No/denies pain     Hand Dominance Right   Extremity/Trunk Assessment Upper Extremity Assessment Upper Extremity Assessment: Overall WFL for tasks assessed (arthritic changes in hands)   Lower Extremity Assessment Lower Extremity Assessment: Overall WFL for tasks assessed   Cervical / Trunk Assessment Cervical / Trunk Assessment: Normal   Communication Communication Communication: No difficulties   Cognition Arousal/Alertness: Awake/alert Behavior During Therapy: WFL for tasks assessed/performed Overall Cognitive Status: Within Functional Limits for tasks assessed                     General Comments       Exercises       Shoulder Instructions      Home Living Family/patient expects to be discharged to:: Private residence Living Arrangements: Children Available Help at Discharge: Family;Available  PRN/intermittently Type of Home: Apartment Home Access: Stairs to enter Entrance Stairs-Number of Steps: 10 Entrance Stairs-Rails: Right;Left Home Layout: One level     Bathroom Shower/Tub: Tub/shower unit Shower/tub characteristics: Door Biochemist, clinical: Standard     Home Equipment: None          Prior Functioning/Environment Level of Independence: Independent        Comments: pt drives, gets her own groceries    OT Diagnosis: Generalized weakness   OT Problem List:     OT Treatment/Interventions:      OT Goals(Current goals can be found in the care plan section) Acute Rehab OT Goals Patient Stated Goal: home  OT Frequency:     Barriers to D/C:            Co-evaluation              End of Session    Activity Tolerance: Patient tolerated treatment well Patient left: in bed;with call bell/phone within reach;with bed alarm set   Time: 1340-1400 OT Time Calculation (min): 20  min Charges:  OT General Charges $OT Visit: 1 Procedure OT Evaluation $Initial OT Evaluation Tier I: 1 Procedure G-Codes:    Caitlin Cervantes 05/24/2015, 2:05 PM  [phone]

## 2015-05-24 NOTE — Discharge Instructions (Signed)
Start taking Keppra 500 mg twice daily for your new seizures.  You will have a follow up appointment on 05/31/15 in the Internal Medicine Clinic on the ground floor of the hospital. The office number is [phone].   Please pick up and take your new medications,.

## 2015-05-24 NOTE — Progress Notes (Signed)
EEG Completed; Results Pending  

## 2015-05-24 NOTE — Progress Notes (Signed)
Pt is being discharged home. Discharge instructions were given to patient and family 

## 2015-05-24 NOTE — Progress Notes (Signed)
Subjective: No complaints.  Currently receiving EEG.  Feels back to baseline.   Exam: Filed Vitals:   05/24/15 0546  BP: 167/105  Pulse: 77  Temp: 98 F (36.7 C)  Resp: 20    HEENT-  Normocephalic, no lesions, without obvious abnormality.  Normal external eye and conjunctiva.  Normal TM's bilaterally.  Normal auditory canals and external ears. Normal external nose, mucus membranes and septum.  Normal pharynx. Cardiovascular- S1, S2 normal, pulses palpable throughout   Lungs- chest clear, no wheezing, rales, normal symmetric air entry Abdomen- normal findings: bowel sounds normal Extremities- no edema Lymph-no adenopathy palpable Musculoskeletal-no joint tenderness, deformity or swelling Skin-warm and dry, no hyperpigmentation, vitiligo, or suspicious lesions    Gen: In bed, NAD MS: AOX3, follows all commands, Speech clear CN: PERRLA, EOMI, TML, Face equal with intact senssation Motor: MAEW Sensory: intact   Pertinent Labs: K+ 3.3 LDL 137  Echo, Carotid doppler and A1c Pending.   Etta Quill PA-C [facility] [phone]  Impression: 74 yo F with 3 episodes of transient alteration in conciousness with amnesia of the events. With the most recent, son describes figure of four posturing.   With this description, I feel that seizure is much more likely than TIA and I would favor treating it as such.    Recommendations: 1) Keppra 500mg  BID 2) No driving for 6 months from most recent seizure, this was discussed with the son and patient who expressed understanding 3) Outpatient neuro referral has been made.   Roland Rack, MD [facility] [phone]  If 7pm- 7am, please page neurology on call as listed in [place].  05/24/2015, 9:47 AM

## 2015-05-25 ENCOUNTER — Telehealth: Payer: Self-pay

## 2015-05-25 NOTE — Telephone Encounter (Signed)
Attempt #1 to make HFU transition of care follow-up call, busy signal

## 2015-05-26 NOTE — Telephone Encounter (Signed)
Transition Care Management Follow-up Telephone Call   Date discharged? 10/31   How have you been since you were released from the hospital? Doing well, "haven't had any seizures"   Do you understand why you were in the hospital? Yes, "i had a seizure"   Do you understand the discharge instructions? Yes, but, pt wondering about limit to driving, she was told 6 months   Where were you discharged to? Home with son   Items Reviewed:  Medications reviewed: yes  Allergies reviewed: yes   Dietary changes reviewed: yes   Referrals reviewed: yes, neurology office should call with appointment, notes indicate appointment requested within 2 weeks of d/c   Functional Questionnaire:   Activities of Daily Living (ADLs):   She states they are independent in the following: ambulation, bathing and hygiene, feeding, continence, grooming, toileting and dressing States they require assistance with the following: none   Any transportation issues/concerns?:  No, other than being told not to drive for 6 months, I was unable to find this instruction in her d/c notes but advised pt with new medications and new seizures this was a good precaution to follow at least until she sees PCP on 11/7 for clarification   Any patient concerns?  Driving as previously mentioned   Confirmed importance and date/time of follow-up visits scheduled yes  Provider Appointment booked with Dr. Osa Craver 11/7 at 2:15  Confirmed with patient if condition begins to worsen call PCP or go to the ER.  Patient was given the office number and encouraged to call back with question or concerns.  : yes

## 2015-05-31 ENCOUNTER — Encounter: Payer: Self-pay | Admitting: Internal Medicine

## 2015-05-31 ENCOUNTER — Ambulatory Visit (INDEPENDENT_AMBULATORY_CARE_PROVIDER_SITE_OTHER): Payer: Self-pay | Admitting: Internal Medicine

## 2015-05-31 VITALS — BP 169/97 | HR 78 | Temp 98.2°F | Ht 58.5 in | Wt 101.8 lb

## 2015-05-31 DIAGNOSIS — G40909 Epilepsy, unspecified, not intractable, without status epilepticus: Secondary | ICD-10-CM

## 2015-05-31 DIAGNOSIS — E785 Hyperlipidemia, unspecified: Secondary | ICD-10-CM

## 2015-05-31 DIAGNOSIS — G459 Transient cerebral ischemic attack, unspecified: Secondary | ICD-10-CM

## 2015-05-31 DIAGNOSIS — I10 Essential (primary) hypertension: Secondary | ICD-10-CM

## 2015-05-31 DIAGNOSIS — Z8673 Personal history of transient ischemic attack (TIA), and cerebral infarction without residual deficits: Secondary | ICD-10-CM

## 2015-05-31 DIAGNOSIS — R569 Unspecified convulsions: Secondary | ICD-10-CM

## 2015-05-31 DIAGNOSIS — Z7982 Long term (current) use of aspirin: Secondary | ICD-10-CM

## 2015-05-31 MED ORDER — HYDROCHLOROTHIAZIDE 25 MG PO TABS: 25.0000 mg | ORAL_TABLET | Freq: Every day | ORAL | Status: DC

## 2015-05-31 NOTE — Assessment & Plan Note (Addendum)
Patient was recently hospitalized for TIA work up, but during hospitalization, she experienced staring like spells. EEG demonstrated brief runs of generalized delta activity and indeterminate of epileptic activity. With concern for secondary generalization of focal seizure, Neurology was consulted and she was started on Keppra 500 mg BID. Patient was advised not to drive for 6 months due to her risk of repeat nonresponsive episodes. Patient denies any repeat episodes of staring off, confusion, or seizure like activity. She reports her son took her keys away and has been driving her everywhere. She has been compliant with the Keppra 500 mg BID.  Plan: -Continue Keppra 500 mg BID -Follow up with Neurology on 06/08/15

## 2015-05-31 NOTE — Patient Instructions (Signed)
CONTINUE TAKING YOUR MEDICATIONS AS PRESCRIBED.  FOLLOW UP WITH NEUROLOGY ON 06/08/15.   GOOD JOB NOT DRIVING.

## 2015-05-31 NOTE — Assessment & Plan Note (Addendum)
Patient was recently hospitalized for work up for TIA. tPA was not administered as her symptoms completely resolved in the ED.Risk factor modification was followed. She was discharged on ASA 81 mg, Atorvastatin 40 mg, and HCTZ 12.5mg . Patient reports compliance. She denies anymore episodes of staring off or confusion. She denies any numbness, weakness, or focal deficits.  Plan: -Continue current management for risk factor modification -Follow up with neurology on 06/08/15

## 2015-05-31 NOTE — Progress Notes (Signed)
Internal Medicine Clinic Attending  Case discussed with Dr. Richardson at the time of the visit.  We reviewed the resident's history and exam and pertinent patient test results.  I agree with the assessment, diagnosis, and plan of care documented in the resident's note. 

## 2015-05-31 NOTE — Progress Notes (Signed)
   Subjective:    Patient ID: Caitlin Cervantes, female    DOB: 02-19-1941, 74 y.o.   MRN: [id number]  HPI Caitlin Cervantes is a 74 y.o. female with PMHx of HTN, HLD, TIA, and Seizure who presents to the clinic for hospital follow up for seizure versus TIA. Please see A&P for the status of the patient's chronic medical problems.   PMHx: TIA, Seizure, HTN, HLD   Outpatient Encounter Prescriptions as of 05/31/2015  Medication Sig  . aspirin EC 81 MG tablet Take 81 mg by mouth at bedtime.  Marland Kitchen atorvastatin (LIPITOR) 40 MG tablet Take 1 tablet (40 mg total) by mouth daily at 6 PM.  . hydrochlorothiazide (HYDRODIURIL) 25 MG tablet Take 1 tablet (25 mg total) by mouth daily.  Marland Kitchen KRILL OIL PO Take 1 capsule by mouth at bedtime.  . levETIRAcetam (KEPPRA) 500 MG tablet Take 1 tablet (500 mg total) by mouth 2 (two) times daily.  Marland Kitchen loratadine (CLARITIN) 10 MG tablet Take 1 tablet (10 mg total) by mouth daily.  . [DISCONTINUED] hydrochlorothiazide (MICROZIDE) 12.5 MG capsule Take 1 capsule (12.5 mg total) by mouth daily.   No facility-administered encounter medications on file as of 05/31/2015.    No family history on file.  Social History   Social History  . Marital Status: Single    Spouse Name: N/A  . Number of Children: N/A  . Years of Education: N/A   Occupational History  . Not on file.   Social History Main Topics  . Smoking status: Never Smoker   . Smokeless tobacco: Not on file  . Alcohol Use: Not on file  . Drug Use: Not on file  . Sexual Activity: Not on file   Other Topics Concern  . Not on file   Social History Narrative   Review of Systems General: Denies fever, chills, fatigue Respiratory: Denies SOB, cough Cardiovascular: Denies chest pain and palpitations.  Skin: Denies pallor, rash and wounds.  Neurological: Denies dizziness, headaches, weakness, lightheadedness, numbness, seizures, and syncope Psychiatric/Behavioral: Denies mood changes, confusion     Objective:    Physical Exam Filed Vitals:   05/31/15 1417  BP: 169/97  Pulse: 78  Temp: 98.2 F (36.8 C)  TempSrc: Oral  Height: 4' 10.5" (1.486 m)  Weight: 101 lb 12.8 oz (46.176 kg)  SpO2: 98%   General: Vital signs reviewed.  Patient is thin, elderly female, in no acute distress and cooperative with exam.  Cardiovascular: RRR, 2/6 holosystolic murmur radiating to the axilla. Pulmonary/Chest: Clear to auscultation bilaterally, no wheezes, rales, or rhonchi. Abdominal: Soft, non-tender, non-distended, BS + Extremities: No lower extremity edema bilaterally Neurological: A&O x3, Strength is normal and symmetric bilaterally, cranial nerve II-XII are grossly intact, no focal motor deficit, sensory intact to light touch bilaterally.  Psychiatric: Normal mood and affect. speech and behavior is normal. Cognition and memory are normal.      Assessment & Plan:   Please see problem based assessment and plan.

## 2015-05-31 NOTE — Assessment & Plan Note (Addendum)
BP Readings from Last 3 Encounters:  05/31/15 169/97  05/24/15 160/94    Lab Results  Component Value Date   NA 140 05/24/2015   K 4.0 05/24/2015   CREATININE 0.85 05/24/2015    Assessment: Blood pressure control:  Uncontrolled Progress toward BP goal:   Deteriorated Comments: Patient is on HCTZ 12.5 mg daily.   Plan: Medications:  Increase to HCTZ 25 mg daily.  Educational resources provided: brochure (denies) Other plans: Follow up one month

## 2015-05-31 NOTE — Assessment & Plan Note (Addendum)
Lipid Panel     Component Value Date/Time   CHOL 232* 05/23/2015 0626   TRIG 81 05/23/2015 0626   HDL 59 05/23/2015 0626   CHOLHDL 3.9 05/23/2015 0626   VLDL 16 05/23/2015 0626   LDLCALC 157* 05/23/2015 0626    Patient is compliant with atorvastatin 40 mg daily.   Plan: -Continue atorvastatin 40 mg daily

## 2015-06-08 ENCOUNTER — Encounter: Payer: Self-pay | Admitting: Neurology

## 2015-06-08 ENCOUNTER — Ambulatory Visit (INDEPENDENT_AMBULATORY_CARE_PROVIDER_SITE_OTHER): Payer: Self-pay | Admitting: Neurology

## 2015-06-08 VITALS — BP 171/105 | HR 102 | Ht <= 58 in | Wt 104.0 lb

## 2015-06-08 DIAGNOSIS — R569 Unspecified convulsions: Secondary | ICD-10-CM

## 2015-06-08 DIAGNOSIS — I639 Cerebral infarction, unspecified: Secondary | ICD-10-CM

## 2015-06-08 NOTE — Patient Instructions (Addendum)
We will continue the [place] for now and follow up in 4 months. No driving for now.  Seizure, Adult A seizure is abnormal electrical activity in the brain. Seizures usually last from 30 seconds to 2 minutes. There are various types of seizures. Before a seizure, you may have a warning sensation (aura) that a seizure is about to occur. An aura may include the following symptoms:   Fear or anxiety.  Nausea.  Feeling like the room is spinning (vertigo).  Vision changes, such as seeing flashing lights or spots. Common symptoms during a seizure include:  A change in attention or behavior (altered mental status).  Convulsions with rhythmic jerking movements.  Drooling.  Rapid eye movements.  Grunting.  Loss of bladder and bowel control.  Bitter taste in the mouth.  Tongue biting. After a seizure, you may feel confused and sleepy. You may also have an injury resulting from convulsions during the seizure. HOME CARE INSTRUCTIONS   If you are given medicines, take them exactly as prescribed by your health care provider.  Keep all follow-up appointments as directed by your health care provider.  Do not swim or drive or engage in risky activity during which a seizure could cause further injury to you or others until your health care provider says it is OK.  Get adequate rest.  Teach friends and family what to do if you have a seizure. They should:  Lay you on the ground to prevent a fall.  Put a cushion under your head.  Loosen any tight clothing around your neck.  Turn you on your side. If vomiting occurs, this helps keep your airway clear.  Stay with you until you recover.  Know whether or not you need emergency care. SEEK IMMEDIATE MEDICAL CARE IF:  The seizure lasts longer than 5 minutes.  The seizure is severe or you do not wake up immediately after the seizure.  You have an altered mental status after the seizure.  You are having more frequent or worsening  seizures. Someone should drive you to the emergency department or call local emergency services (911 in [place] MAKE SURE YOU:  Understand these instructions.  Will watch your condition.  Will get help right away if you are not doing well or get worse.   This information is not intended to replace advice given to you by your health care provider. Make sure you discuss any questions you have with your health care provider.   Document Released: 07/07/2000 Document Revised: 07/31/2014 Document Reviewed: 02/19/2013 Elsevier Interactive Patient Education 2016 [employer]. Seizure, Adult A seizure is abnormal electrical activity in the brain. Seizures usually last from 30 seconds to 2 minutes. There are various types of seizures. Before a seizure, you may have a warning sensation (aura) that a seizure is about to occur. An aura may include the following symptoms:   Fear or anxiety.  Nausea.  Feeling like the room is spinning (vertigo).  Vision changes, such as seeing flashing lights or spots. Common symptoms during a seizure include:  A change in attention or behavior (altered mental status).  Convulsions with rhythmic jerking movements.  Drooling.  Rapid eye movements.  Grunting.  Loss of bladder and bowel control.  Bitter taste in the mouth.  Tongue biting. After a seizure, you may feel confused and sleepy. You may also have an injury resulting from convulsions during the seizure. HOME CARE INSTRUCTIONS   If you are given medicines, take them exactly as prescribed by your health care  provider.  Keep all follow-up appointments as directed by your health care provider.  Do not swim or drive or engage in risky activity during which a seizure could cause further injury to you or others until your health care provider says it is OK.  Get adequate rest.  Teach friends and family what to do if you have a seizure. They should:  Lay you on the ground to prevent a  fall.  Put a cushion under your head.  Loosen any tight clothing around your neck.  Turn you on your side. If vomiting occurs, this helps keep your airway clear.  Stay with you until you recover.  Know whether or not you need emergency care. SEEK IMMEDIATE MEDICAL CARE IF:  The seizure lasts longer than 5 minutes.  The seizure is severe or you do not wake up immediately after the seizure.  You have an altered mental status after the seizure.  You are having more frequent or worsening seizures. Someone should drive you to the emergency department or call local emergency services (911 in [place] MAKE SURE YOU:  Understand these instructions.  Will watch your condition.  Will get help right away if you are not doing well or get worse.   This information is not intended to replace advice given to you by your health care provider. Make sure you discuss any questions you have with your health care provider.   Document Released: 07/07/2000 Document Revised: 07/31/2014 Document Reviewed: 02/19/2013 Elsevier Interactive Patient Education [employer].

## 2015-06-08 NOTE — Progress Notes (Signed)
Reason for visit: Possible seizure  Referring physician: [facility] Caitlin Cervantes is a 74 y.o. female  History of present illness:  Caitlin Cervantes is a 74 year old right-handed black female with a history of a syncopal event that occurred on 05/22/2015. The patient had just gone to the grocery store, she returned home and was unpacking her grocery bags when she suddenly lost consciousness without warning and hit the floor. There is no report of jerking or stiffening with this event. The patient did not bite her tongue or lose control the bowels or the bladder. The patient went to the hospital for an evaluation. MRI of the brain showed no acute changes, but a moderate level small vessel disease was noted. A 2-D echocardiogram of the heart was relatively unremarkable. An EEG study was done and this was normal. The patient was seen by neurology, and the history was taken that the patient has had at least 3 prior events of transient loss of awareness, at least one event occurred while the patient was standing and she did not fall down. For this reason, Keppra was added to the regimen for presumed seizures. She was asked not to drive for at least 6 months. She returns to this office for an evaluation. There is no family history of seizures.  Past Medical History  Diagnosis Date  . Hypertension   . Hypercholesteremia   . Seizure [facility])     Past Surgical History  Procedure Laterality Date  . Lung lobectomy      TB  . Tonsillectomy    . Tubal ligation Bilateral     Family History  Problem Relation Age of Onset  . Hypertension Father   . Seizures Neg Hx     Social history:  reports that she has never smoked. She has never used smokeless tobacco. She reports that she does not drink alcohol or use illicit drugs.  Medications:  Prior to Admission medications   Medication Sig Start Date End Date Taking? Authorizing Provider  aspirin EC 81 MG tablet Take 81 mg by mouth at bedtime.   Yes  Historical Provider, MD  atorvastatin (LIPITOR) 40 MG tablet Take 1 tablet (40 mg total) by mouth daily at 6 PM. 05/24/15  Yes Iline Oven, MD  hydrochlorothiazide (HYDRODIURIL) 25 MG tablet Take 1 tablet (25 mg total) by mouth daily. 05/31/15  Yes Alexa Sherral Hammers, MD  KRILL OIL PO Take 1 capsule by mouth at bedtime.   Yes Historical Provider, MD  levETIRAcetam (KEPPRA) 500 MG tablet Take 1 tablet (500 mg total) by mouth 2 (two) times daily. 05/24/15  Yes Carly Montey Hora, MD  loratadine (CLARITIN) 10 MG tablet Take 1 tablet (10 mg total) by mouth daily. 05/24/15  Yes Iline Oven, MD     No Known Allergies  ROS:  Out of a complete 14 system review of symptoms, the patient complains only of the following symptoms, and all other reviewed systems are negative.  Fatigue Wheezing Joint swelling, aching muscles Allergies Moles Syncope  Blood pressure 171/105, pulse 102, height 4\' 10"  (1.473 m), weight 104 lb (47.174 kg).  Physical Exam  General: The patient is alert and cooperative at the time of the examination.  Eyes: Pupils are equal, round, and reactive to light. Discs are flat bilaterally.  Neck: The neck is supple, no carotid bruits are noted.  Respiratory: The respiratory examination is clear.  Cardiovascular: The cardiovascular examination reveals a regular rate and rhythm, no obvious murmurs  or rubs are noted.  Skin: Extremities are without significant edema.  Neurologic Exam  Mental status: The patient is alert and oriented x 3 at the time of the examination. The patient has apparent normal recent and remote memory, with an apparently normal attention span and concentration ability.  Cranial nerves: Facial symmetry is present. There is good sensation of the face to pinprick and soft touch bilaterally. The strength of the facial muscles and the muscles to head turning and shoulder shrug are normal bilaterally. Speech is well enunciated, no aphasia or dysarthria  is noted. Extraocular movements are full. Visual fields are full. The tongue is midline, and the patient has symmetric elevation of the soft palate. No obvious hearing deficits are noted.  Motor: The motor testing reveals 5 over 5 strength of all 4 extremities, with exception of some weakness of intrinsic muscles of the hands bilaterally. Good symmetric motor tone is noted throughout.  Sensory: Sensory testing is intact to pinprick, soft touch, vibration sensation, and position sense on all 4 extremities. No evidence of extinction is noted.  Coordination: Cerebellar testing reveals good finger-nose-finger and heel-to-shin bilaterally.  Gait and station: Gait is normal. Tandem gait is normal. Romberg is negative. No drift is seen.  Reflexes: Deep tendon reflexes are symmetric and normal bilaterally. Toes are downgoing bilaterally.   Assessment/Plan:  1. Episode of syncope  The patient is being evaluated and treated for presumed seizure events. The patient has had several events of transient loss of awareness, the patient herself has no recollection of any of these events. The patient will be maintained on Keppra, but if the episodes continue, a prolonged cardiac monitor study will be done. The patient otherwise will follow-up in 4 months. She is not to operate a motor vehicle.  Jill Alexanders MD 06/08/2015 7:07 PM  [facility] [address] [place],  [address]  Phone [phone] Fax [phone]

## 2015-06-30 ENCOUNTER — Ambulatory Visit: Payer: Medicare Other | Admitting: Internal Medicine

## 2015-07-06 ENCOUNTER — Ambulatory Visit (INDEPENDENT_AMBULATORY_CARE_PROVIDER_SITE_OTHER): Payer: Self-pay | Admitting: Internal Medicine

## 2015-07-06 ENCOUNTER — Encounter: Payer: Self-pay | Admitting: Internal Medicine

## 2015-07-06 VITALS — BP 175/108 | HR 108 | Temp 98.0°F | Wt 108.2 lb

## 2015-07-06 DIAGNOSIS — Z7982 Long term (current) use of aspirin: Secondary | ICD-10-CM

## 2015-07-06 DIAGNOSIS — R569 Unspecified convulsions: Secondary | ICD-10-CM

## 2015-07-06 DIAGNOSIS — I10 Essential (primary) hypertension: Secondary | ICD-10-CM

## 2015-07-06 DIAGNOSIS — Z79899 Other long term (current) drug therapy: Secondary | ICD-10-CM

## 2015-07-06 DIAGNOSIS — G40909 Epilepsy, unspecified, not intractable, without status epilepticus: Secondary | ICD-10-CM

## 2015-07-06 DIAGNOSIS — E785 Hyperlipidemia, unspecified: Secondary | ICD-10-CM

## 2015-07-06 MED ORDER — ATORVASTATIN CALCIUM 40 MG PO TABS: 40.0000 mg | ORAL_TABLET | Freq: Every day | ORAL | Status: DC

## 2015-07-06 MED ORDER — LISINOPRIL 5 MG PO TABS: 5.0000 mg | ORAL_TABLET | Freq: Every day | ORAL | Status: DC

## 2015-07-06 MED ORDER — LEVETIRACETAM 500 MG PO TABS: 500.0000 mg | ORAL_TABLET | Freq: Two times a day (BID) | ORAL | Status: DC

## 2015-07-06 NOTE — Assessment & Plan Note (Signed)
She continues to be hypertensive today to 170/100 on thiazide 25 mg daily. Given she is prediabetic, I have added lisinopril 5 mg daily. She'll schedule an appointment with Dr. Marvel Plan in 1 month; if she remains hypertensive, we still have room to go up on the lisinopril. I wanted to start low and go slow given her syncopal episodes and elderly age; she has a goal of less than 150/90.

## 2015-07-06 NOTE — Patient Instructions (Addendum)
Miss Laventure,  It was great to meet you today.  I'm sorry you've been having these episodes of passing out, but I'm glad to her that they've been getting better on their own.  As we discussed, I think it's just fine to start taking her thiazide medication at night instead of the morning. Since your blood pressure is still a little high, we've added a low-dose medication called lisinopril. This medication can help prevent kidney disease, but it can also cause a cough, so if you notice you're coughing more, let us know and we can change it to another medication. I think you'll tolerate this just fine. I've also refilled her Keppra and atorvastatin. All of these medications will be a your pharmacy for pickup. I'd like to schedule you for an appointment with Dr. Marvel Plan sometime in January to recheck your blood pressure.  Take care, and have a Merry Christmas, Dr. Melburn Hake

## 2015-07-06 NOTE — Assessment & Plan Note (Signed)
She has not had another syncopal episode since she was discharged. She saw the neurologist in late November, who recommended cardiac monitoring if she continues to have these syncopal episodes. She is taking her Keppra twice daily.

## 2015-07-06 NOTE — Assessment & Plan Note (Signed)
I refilled her atorvastatin today.

## 2015-07-06 NOTE — Progress Notes (Signed)
Patient ID: Caitlin Cervantes, female   DOB: 1940/09/24, 74 y.o.   MRN: [id number] [facility] Subjective:   Patient ID: Caitlin Cervantes female   DOB: August 10, 1940 74 y.o.   MRN: [id number]  HPI: Caitlin Cervantes is a 74 y.o. female with history of hypertension, hyperlipidemia, and syncopal events of unknown origin, suspected to be seizures, presenting for a follow-up of her hypertension.  Since she was discharged from the hospital in November, she has not had any more syncopal episodes. She was started on Keppra by the neurologist, as they thought these may be seizures. She has been taking her Keppra twice daily; she says this medication makes her feel "high" sometimes but she denies any other side effects.  Regarding her hypertension, she has not been checking her blood pressures at home. She denies any orthostatic symptoms.  Past Medical History  Diagnosis Date  . Hypertension   . Hypercholesteremia   . Seizure [facility])    Current Outpatient Prescriptions  Medication Sig Dispense Refill  . aspirin EC 81 MG tablet Take 81 mg by mouth at bedtime.    Marland Kitchen atorvastatin (LIPITOR) 40 MG tablet Take 1 tablet (40 mg total) by mouth daily at 6 PM. 30 tablet 0  . hydrochlorothiazide (HYDRODIURIL) 25 MG tablet Take 1 tablet (25 mg total) by mouth daily. 30 tablet 3  . KRILL OIL PO Take 1 capsule by mouth at bedtime.    . levETIRAcetam (KEPPRA) 500 MG tablet Take 1 tablet (500 mg total) by mouth 2 (two) times daily. 60 tablet 1  . loratadine (CLARITIN) 10 MG tablet Take 1 tablet (10 mg total) by mouth daily.     No current facility-administered medications for this visit.   Family History  Problem Relation Age of Onset  . Hypertension Father   . Seizures Neg Hx    Social History   Social History  . Marital Status: Single    Spouse Name: N/A  . Number of Children: 1  . Years of Education: HS   Occupational History  . retired    Social History Main Topics  . Smoking  status: Never Smoker   . Smokeless tobacco: Never Used  . Alcohol Use: No  . Drug Use: No  . Sexual Activity: Not on file   Other Topics Concern  . Not on file   Social History Narrative   Patient rarely drinks caffeine.   Patient is right handed.    Review of Systems  Constitutional: Negative for fever, chills and malaise/fatigue.  Respiratory: Negative for cough.   Cardiovascular: Negative for chest pain, palpitations and leg swelling.  Gastrointestinal: Negative for abdominal pain.  Musculoskeletal: Negative for myalgias.  Skin: Negative for rash.  Neurological: Negative for dizziness, tingling, seizures, loss of consciousness and headaches.   Objective:  Physical Exam: Filed Vitals:   07/06/15 0955  BP: 175/108  Pulse: 108  Temp: 98 F (36.7 C)  TempSrc: Oral  Weight: 108 lb 3.2 oz (49.079 kg)  SpO2: 98%   General: resting in bed comfortably, appropriately conversational HEENT: no scleral icterus, extra-ocular muscles intact, oropharynx without lesions Cardiac: regular rate and rhythm, no rubs, murmurs or gallops Pulm: breathing well, clear to auscultation bilaterally Abd: bowel sounds normal, soft, nondistended, non-tender Ext: warm and well perfused, without pedal edema Lymph: no cervical or supraclavicular lymphadenopathy Skin: no rash, hair, or nail changes Neuro: alert and oriented X3, cranial nerves II-XII grossly intact, moving all extremities well  Assessment & Plan:  Case discussed with Dr. Daryll Drown  Seizures [facility]) She has not had another syncopal episode since she was discharged. She saw the neurologist in late November, who recommended cardiac monitoring if she continues to have these syncopal episodes. She is taking her Keppra twice daily.  Hyperlipidemia I refilled her atorvastatin today.  Hypertension She continues to be hypertensive today to 170/100 on thiazide 25 mg daily. Given she is prediabetic, I have added lisinopril 5 mg daily. She'll  schedule an appointment with Dr. Marvel Plan in 1 month; if she remains hypertensive, we still have room to go up on the lisinopril. I wanted to start low and go slow given her syncopal episodes and elderly age; she has a goal of less than 150/90.   Medications Ordered Meds ordered this encounter  Medications  . levETIRAcetam (KEPPRA) 500 MG tablet    Sig: Take 1 tablet (500 mg total) by mouth 2 (two) times daily.    Dispense:  60 tablet    Refill:  1  . atorvastatin (LIPITOR) 40 MG tablet    Sig: Take 1 tablet (40 mg total) by mouth daily at 6 PM.    Dispense:  30 tablet    Refill:  0  . lisinopril (PRINIVIL,ZESTRIL) 5 MG tablet    Sig: Take 1 tablet (5 mg total) by mouth daily.    Dispense:  30 tablet    Refill:  11   Other Orders No orders of the defined types were placed in this encounter.   Follow Up: Return in about 4 weeks (around 08/03/2015) for blood pressure check.

## 2015-07-12 NOTE — Progress Notes (Signed)
Internal Medicine Clinic Attending  I saw and evaluated the patient.  I personally confirmed the key portions of the history and exam documented by Dr. Flores and I reviewed pertinent patient test results.  The assessment, diagnosis, and plan were formulated together and I agree with the documentation in the resident's note.  

## 2015-08-03 ENCOUNTER — Ambulatory Visit: Payer: Medicare Other | Admitting: Internal Medicine

## 2015-09-08 ENCOUNTER — Ambulatory Visit: Payer: Medicare Other | Admitting: Internal Medicine

## 2015-09-17 ENCOUNTER — Encounter: Payer: Self-pay | Admitting: Internal Medicine

## 2015-09-17 ENCOUNTER — Ambulatory Visit (INDEPENDENT_AMBULATORY_CARE_PROVIDER_SITE_OTHER): Payer: Self-pay | Admitting: Internal Medicine

## 2015-09-17 VITALS — BP 165/76 | HR 88 | Temp 98.1°F | Ht <= 58 in | Wt 114.7 lb

## 2015-09-17 DIAGNOSIS — G40909 Epilepsy, unspecified, not intractable, without status epilepticus: Secondary | ICD-10-CM

## 2015-09-17 DIAGNOSIS — I10 Essential (primary) hypertension: Secondary | ICD-10-CM | POA: Diagnosis not present

## 2015-09-17 DIAGNOSIS — R569 Unspecified convulsions: Secondary | ICD-10-CM

## 2015-09-17 DIAGNOSIS — Z79899 Other long term (current) drug therapy: Secondary | ICD-10-CM

## 2015-09-17 DIAGNOSIS — Z0000 Encounter for general adult medical examination without abnormal findings: Secondary | ICD-10-CM | POA: Insufficient documentation

## 2015-09-17 MED ORDER — LISINOPRIL 20 MG PO TABS: 20.0000 mg | ORAL_TABLET | Freq: Every day | ORAL | Status: DC

## 2015-09-17 MED ORDER — LEVETIRACETAM 500 MG PO TABS: 500.0000 mg | ORAL_TABLET | Freq: Two times a day (BID) | ORAL | Status: DC

## 2015-09-17 NOTE — Progress Notes (Signed)
   Subjective:   Patient ID: Caitlin Cervantes female   DOB: 1941/02/22 75 y.o.   MRN: [id number]  HPI: [name] is a 75 y.o. female w/ PMHx of HTN, HLD, and h/o seizures, presents to the clinic today for a follow-up visit regarding her blood pressure. Patient has not been seen since 06/2015 at which time she was started on Lisinopril 5 mg daily in addition to her HCTZ. She has been tolerating this medication well. BP still elevated today.   Past Medical History  Diagnosis Date  . Hypertension   . Hypercholesteremia   . Seizure [facility])    Current Outpatient Prescriptions  Medication Sig Dispense Refill  . aspirin EC 81 MG tablet Take 81 mg by mouth at bedtime.    Marland Kitchen atorvastatin (LIPITOR) 40 MG tablet Take 1 tablet (40 mg total) by mouth daily at 6 PM. 30 tablet 0  . hydrochlorothiazide (HYDRODIURIL) 25 MG tablet Take 1 tablet (25 mg total) by mouth daily. 30 tablet 3  . KRILL OIL PO Take 1 capsule by mouth at bedtime.    . levETIRAcetam (KEPPRA) 500 MG tablet Take 1 tablet (500 mg total) by mouth 2 (two) times daily. 60 tablet 1  . lisinopril (PRINIVIL,ZESTRIL) 5 MG tablet Take 1 tablet (5 mg total) by mouth daily. 30 tablet 11  . loratadine (CLARITIN) 10 MG tablet Take 1 tablet (10 mg total) by mouth daily.     No current facility-administered medications for this visit.    Review of Systems: General: Denies fever, chills, diaphoresis, appetite change and fatigue.  Respiratory: Denies SOB, DOE, cough, and wheezing.   Cardiovascular: Denies chest pain and palpitations.  Gastrointestinal: Denies nausea, vomiting, abdominal pain, and diarrhea.  Genitourinary: Denies dysuria, increased frequency, and flank pain. Endocrine: Denies hot or cold intolerance, polyuria, and polydipsia. Musculoskeletal: Denies myalgias, back pain, joint swelling, arthralgias and gait problem.  Skin: Denies pallor, rash and wounds.  Neurological: Denies dizziness, seizures, syncope, weakness,  lightheadedness, numbness and headaches.  Psychiatric/Behavioral: Denies mood changes, and sleep disturbances.  Objective:   Physical Exam: Filed Vitals:   09/17/15 0930  BP: 165/76  Pulse: 88  Temp: 98.1 F (36.7 C)  TempSrc: Oral  Height: 4\' 10"  (1.473 m)  Weight: 114 lb 11.2 oz (52.028 kg)  SpO2: 100%    General: Alert, cooperative, NAD. HEENT: PERRL, EOMI. Moist mucus membranes Neck: Full range of motion without pain, supple, no lymphadenopathy or carotid bruits Lungs: Clear to ascultation bilaterally, normal work of respiration, no wheezes, rales, rhonchi Heart: RRR, no murmurs, gallops, or rubs Abdomen: Soft, non-tender, non-distended, BS + Extremities: No cyanosis, clubbing, or edema Neurologic: Alert & oriented X3, cranial nerves II-XII intact, strength grossly intact, sensation intact to light touch   Assessment & Plan:   Please see problem based assessment and plan.

## 2015-09-17 NOTE — Assessment & Plan Note (Signed)
No recent seizures. Taking Keppra 500 mg bid. Refill this today.

## 2015-09-17 NOTE — Patient Instructions (Signed)
1. Please make a follow up appointment for 2-4 weeks.   2. Please take all medications as previously prescribed with the following changes:  Increase your Lisinopril to 20 mg daily. A new prescription was called into your pharmacy.   3. If you have worsening of your symptoms or new symptoms arise, please call the clinic [phone]), or go to the ER immediately if symptoms are severe.  You have done a great job in taking all your medications. Please continue to do this.

## 2015-09-17 NOTE — Assessment & Plan Note (Signed)
BP Readings from Last 3 Encounters:  09/17/15 165/76  07/06/15 175/108  06/08/15 171/105    Lab Results  Component Value Date   NA 140 05/24/2015   K 4.0 05/24/2015   CREATININE 0.85 05/24/2015    Assessment: Blood pressure control:  Elevated Comments: Started on Lisinopril 5 mg daily at last appointment. Also on HCTZ 25 mg daily.   Plan: Medications:  Continue HCTZ 25 mg daily. Increase Lisinopril to 20 mg daily.  Other plans: Check BMP. Patient to return for follow up in 2-4 weeks.

## 2015-09-17 NOTE — Assessment & Plan Note (Signed)
Declined flu shot and mammogram today.

## 2015-09-18 LAB — BMP8+ANION GAP
ANION GAP: 21 mmol/L — AB (ref 10.0–18.0)
BUN / CREAT RATIO: 16 (ref 11–26)
BUN: 15 mg/dL (ref 8–27)
CO2: 22 mmol/L (ref 18–29)
Calcium: 9.9 mg/dL (ref 8.7–10.3)
Chloride: 95 mmol/L — ABNORMAL LOW (ref 96–106)
Creatinine, Ser: 0.95 mg/dL (ref 0.57–1.00)
GFR calc Af Amer: 69 mL/min/{1.73_m2} (ref >59)
GFR, EST NON AFRICAN AMERICAN: 60 mL/min/{1.73_m2} (ref >59)
Glucose: 88 mg/dL (ref 65–99)
Potassium: 4 mmol/L (ref 3.5–5.2)
SODIUM: 138 mmol/L (ref 134–144)

## 2015-09-20 NOTE — Progress Notes (Signed)
Internal Medicine Clinic Attending  Case discussed with Dr. Jones at the time of the visit.  We reviewed the resident's history and exam and pertinent patient test results.  I agree with the assessment, diagnosis, and plan of care documented in the resident's note.  

## 2015-09-22 ENCOUNTER — Other Ambulatory Visit: Payer: Self-pay

## 2015-09-22 DIAGNOSIS — I10 Essential (primary) hypertension: Secondary | ICD-10-CM

## 2015-09-22 MED ORDER — HYDROCHLOROTHIAZIDE 25 MG PO TABS: 25.0000 mg | ORAL_TABLET | Freq: Every day | ORAL | Status: DC

## 2015-10-11 ENCOUNTER — Ambulatory Visit (INDEPENDENT_AMBULATORY_CARE_PROVIDER_SITE_OTHER): Payer: Self-pay | Admitting: Adult Health

## 2015-10-11 ENCOUNTER — Encounter: Payer: Self-pay | Admitting: Adult Health

## 2015-10-11 VITALS — BP 144/72 | HR 72 | Resp 16 | Ht <= 58 in | Wt 116.0 lb

## 2015-10-11 DIAGNOSIS — R569 Unspecified convulsions: Secondary | ICD-10-CM

## 2015-10-11 NOTE — Progress Notes (Signed)
PATIENT: Caitlin Cervantes DOB: 10-26-1940  REASON FOR VISIT: follow up- seizures HISTORY FROM: patient  HISTORY OF PRESENT ILLNESS: Caitlin Cervantes is a 75 year old female with a history of possible seizure events. She returns today for follow-up. The patient has been taking Keppra 500 mg twice a day. She reports that since starting this medication she's not had any additional syncopal episodes. She states that she is actually feeling a lot better on the medication. She lives at home alone. She is able to complete all ADLs independentely. She is not operating a motor vehicle at this time. She states that she has tolerated the medication well. Denies any changes with her gait or balance. She states that she does have arthritis in the right leg which affects her gait. She denies any new neurological symptoms. She returns today for an evaluation.  HISTORY 06/08/15 (WILLIS): Caitlin Cervantes is a 75 year old right-handed black female with a history of a syncopal event that occurred on 05/22/2015. The patient had just gone to the grocery store, she returned home and was unpacking her grocery bags when she suddenly lost consciousness without warning and hit the floor. There is no report of jerking or stiffening with this event. The patient did not bite her tongue or lose control the bowels or the bladder. The patient went to the hospital for an evaluation. MRI of the brain showed no acute changes, but a moderate level small vessel disease was noted. A 2-D echocardiogram of the heart was relatively unremarkable. An EEG study was done and this was normal. The patient was seen by neurology, and the history was taken that the patient has had at least 3 prior events of transient loss of awareness, at least one event occurred while the patient was standing and she did not fall down. For this reason, Keppra was added to the regimen for presumed seizures. She was asked not to drive for at least 6 months. She returns to this  office for an evaluation. There is no family history of seizures.  REVIEW OF SYSTEMS: Out of a complete 14 system review of symptoms, the patient complains only of the following symptoms, and all other reviewed systems are negative.  See history of present illness  ALLERGIES: No Known Allergies  HOME MEDICATIONS: Outpatient Prescriptions Prior to Visit  Medication Sig Dispense Refill  . aspirin EC 81 MG tablet Take 81 mg by mouth at bedtime.    . hydrochlorothiazide (HYDRODIURIL) 25 MG tablet Take 1 tablet (25 mg total) by mouth daily. 30 tablet 11  . KRILL OIL PO Take 1 capsule by mouth at bedtime.    . levETIRAcetam (KEPPRA) 500 MG tablet Take 1 tablet (500 mg total) by mouth 2 (two) times daily. 60 tablet 5  . lisinopril (PRINIVIL,ZESTRIL) 20 MG tablet Take 1 tablet (20 mg total) by mouth daily. 30 tablet 5  . atorvastatin (LIPITOR) 40 MG tablet Take 1 tablet (40 mg total) by mouth daily at 6 PM. (Patient not taking: Reported on 10/11/2015) 30 tablet 0  . loratadine (CLARITIN) 10 MG tablet Take 1 tablet (10 mg total) by mouth daily. (Patient not taking: Reported on 10/11/2015)     No facility-administered medications prior to visit.    PAST MEDICAL HISTORY: Past Medical History  Diagnosis Date  . Hypertension   . Hypercholesteremia   . Seizure ([place])     PAST SURGICAL HISTORY: Past Surgical History  Procedure Laterality Date  . Lung lobectomy      TB  .  Tonsillectomy    . Tubal ligation Bilateral     FAMILY HISTORY: Family History  Problem Relation Age of Onset  . Hypertension Father   . Seizures Neg Hx     SOCIAL HISTORY: Social History   Social History  . Marital Status: Single    Spouse Name: N/A  . Number of Children: 1  . Years of Education: HS   Occupational History  . retired    Social History Main Topics  . Smoking status: Never Smoker   . Smokeless tobacco: Never Used  . Alcohol Use: No  . Drug Use: No  . Sexual Activity: Not on file   Other  Topics Concern  . Not on file   Social History Narrative   Patient rarely drinks caffeine.   Patient is right handed.       PHYSICAL EXAM  Filed Vitals:   10/11/15 1057  BP: 144/72  Pulse: 72  Resp: 16  Height: 4\' 10"  (1.473 m)  Weight: 116 lb (52.617 kg)   Body mass index is 24.25 kg/(m^2).  Generalized: Well developed, in no acute distress   Neurological examination  Mentation: Alert oriented to time, place, history taking. Follows all commands speech and language fluent Cranial nerve II-XII: Pupils were equal round reactive to light. Extraocular movements were full, visual field were full on confrontational test. Facial sensation and strength were normal. Uvula tongue midline. Head turning and shoulder shrug  were normal and symmetric. Motor: The motor testing reveals 5 over 5 strength of all 4 extremities. Good symmetric motor tone is noted throughout.  Sensory: Sensory testing is intact to soft touch on all 4 extremities. No evidence of extinction is noted.  Coordination: Cerebellar testing reveals good finger-nose-finger and heel-to-shin bilaterally.  Gait and station: Patient has a slight limp on the right due to arthritis. Reflexes: Deep tendon reflexes are symmetric and normal bilaterally.   DIAGNOSTIC DATA (LABS, IMAGING, TESTING) - I reviewed patient records, labs, notes, testing and imaging myself where available.  Lab Results  Component Value Date   WBC 8.3 05/22/2015   HGB 13.6 05/22/2015   HCT 40.0 05/22/2015   MCV 83.8 05/22/2015   PLT 402* 05/22/2015      Component Value Date/Time   NA 138 09/17/2015 0955   NA 140 05/24/2015 1025   K 4.0 09/17/2015 0955   CL 95* 09/17/2015 0955   CO2 22 09/17/2015 0955   GLUCOSE 88 09/17/2015 0955   GLUCOSE 110* 05/24/2015 1025   BUN 15 09/17/2015 0955   BUN 8 05/24/2015 1025   CREATININE 0.95 09/17/2015 0955   CALCIUM 9.9 09/17/2015 0955   PROT 7.5 05/22/2015 1308   ALBUMIN 3.8 05/22/2015 1308   AST 34  05/22/2015 1308   ALT 13* 05/22/2015 1308   ALKPHOS 100 05/22/2015 1308   BILITOT 0.3 05/22/2015 1308   GFRNONAA 60 09/17/2015 0955   GFRAA 69 09/17/2015 0955    ASSESSMENT AND PLAN 75 y.o. year old female  has a past medical history of Hypertension; Hypercholesteremia; and Seizure ([place]). here with:  1. Seizures  The patient will remain on Keppra 500 mg twice a day. Her primary care provider has been providing her with this prescription. Patient may resume driving at the end April if she does not have any syncopal events. If she has any additional events she should let us know. Patient verbalized understanding. She will follow-up in 6 months or sooner if needed.   Ward Givens, MSN, NP-C 10/11/2015, 11:33 AM [facility] [address] [place],  [address] [phone]

## 2015-10-11 NOTE — Progress Notes (Signed)
I have read the note, and I agree with the clinical assessment and plan.  WILLIS,CHARLES KEITH   

## 2015-10-15 ENCOUNTER — Ambulatory Visit (INDEPENDENT_AMBULATORY_CARE_PROVIDER_SITE_OTHER): Payer: Self-pay | Admitting: Internal Medicine

## 2015-10-15 ENCOUNTER — Encounter: Payer: Self-pay | Admitting: Internal Medicine

## 2015-10-15 VITALS — BP 155/85 | HR 89 | Temp 98.0°F | Ht <= 58 in | Wt 116.9 lb

## 2015-10-15 DIAGNOSIS — I10 Essential (primary) hypertension: Secondary | ICD-10-CM

## 2015-10-15 DIAGNOSIS — Z0000 Encounter for general adult medical examination without abnormal findings: Secondary | ICD-10-CM

## 2015-10-15 MED ORDER — LISINOPRIL 40 MG PO TABS: 40.0000 mg | ORAL_TABLET | Freq: Every day | ORAL | Status: DC

## 2015-10-15 NOTE — Progress Notes (Signed)
Internal Medicine Clinic Attending  Case discussed with Dr. Jones at the time of the visit.  We reviewed the resident's history and exam and pertinent patient test results.  I agree with the assessment, diagnosis, and plan of care documented in the resident's note.  

## 2015-10-15 NOTE — Patient Instructions (Signed)
1. Please make a follow up appointment for 4-6 weeks.   2. Please take all medications as previously prescribed with the following changes:  Take Lisinopril 40 mg daily. You can take two of your 20 mg tablets until you run out, otherwise, I have sent a new prescription to your pharmacy.   Continue your other medications as previously prescribed.   3. If you have worsening of your symptoms or new symptoms arise, please call the clinic [phone]), or go to the ER immediately if symptoms are severe.  You have done a great job in taking all your medications. Please continue to do this.     Mammogram A mammogram is an X-ray of the breasts that is done to check for abnormal changes. This procedure can screen for and detect any changes that may suggest breast cancer. A mammogram can also identify other changes and variations in the breast, such as:  Inflammation of the breast tissue (mastitis).  An infected area that contains a collection of pus (abscess).  A fluid-filled sac (cyst).  Fibrocystic changes. This is when breast tissue becomes denser, which can make the tissue feel rope-like or uneven under the skin.  Tumors that are not cancerous (benign). LET [facility] CARE PROVIDER KNOW ABOUT:  Any allergies you have.  If you have breast implants.  If you have had previous breast disease, biopsy, or surgery.  If you are breastfeeding.  Any possibility that you could be pregnant, if this applies.  If you are younger than age 16.  If you have a family history of breast cancer. RISKS AND COMPLICATIONS Generally, this is a safe procedure. However, problems may occur, including:  Exposure to radiation. Radiation levels are very low with this test.  The results being misinterpreted.  The need for further tests.  The inability of the mammogram to detect certain cancers. BEFORE THE PROCEDURE  Schedule your test about 1-2 weeks after your menstrual period. This is usually when  your breasts are the least tender.  If you have had a mammogram done at a different facility in the past, get the mammogram X-rays or have them sent to your current exam facility in order to compare them.  Wash your breasts and under your arms the day of the test.  Do not wear deodorants, perfumes, lotions, or powders anywhere on your body on the day of the test.  Remove any jewelry from your neck.  Wear clothes that you can change into and out of easily. PROCEDURE  You will undress from the waist up and put on a gown.  You will stand in front of the X-ray machine.  Each breast will be placed between two plastic or glass plates. The plates will compress your breast for a few seconds. Try to stay as relaxed as possible during the procedure. This does not cause any harm to your breasts and any discomfort you feel will be very brief.  X-rays will be taken from different angles of each breast. The procedure may vary among health care providers and hospitals. AFTER THE PROCEDURE  The mammogram will be examined by a specialist (radiologist).  You may need to repeat certain parts of the test, depending on the quality of the images. This is commonly done if the radiologist needs a better view of the breast tissue.  Ask when your test results will be ready. Make sure you get your test results.  You may resume your normal activities.   This information is not intended to  replace advice given to you by your health care provider. Make sure you discuss any questions you have with your health care provider.   Document Released: 07/07/2000 Document Revised: 03/31/2015 Document Reviewed: 09/18/2014 Elsevier Interactive Patient Education [employer].

## 2015-10-15 NOTE — Progress Notes (Signed)
   Subjective:   Patient ID: Caitlin Cervantes female   DOB: 17-Apr-1941 75 y.o.   MRN: [id number]  HPI: [name] is a 74 y.o. female w/ PMHx of HTN, HLD, and h/o seizures, presents to the clinic today for a follow-up visit regarding her blood pressure. Patient was seen about 1 month ago, at which time her Lisinopril was increased to 20 mg daily. She has continued to take this, in addition to her HCTZ 25 mg daily. Today her blood pressure continues to be mildly elevated. She has no complaints today. Recently seen by neurology with regards to her seizures, was instructed to continue her Keppra. Patient otherwise is very reluctant to do any screening tests, vaccinations, or health care maintenance today.   Past Medical History  Diagnosis Date  . Hypertension   . Hypercholesteremia   . Seizure [facility])    Current Outpatient Prescriptions  Medication Sig Dispense Refill  . aspirin EC 81 MG tablet Take 81 mg by mouth at bedtime.    Marland Kitchen atorvastatin (LIPITOR) 40 MG tablet Take 1 tablet (40 mg total) by mouth daily at 6 PM. (Patient not taking: Reported on 10/11/2015) 30 tablet 0  . hydrochlorothiazide (HYDRODIURIL) 25 MG tablet Take 1 tablet (25 mg total) by mouth daily. 30 tablet 11  . KRILL OIL PO Take 1 capsule by mouth at bedtime.    . levETIRAcetam (KEPPRA) 500 MG tablet Take 1 tablet (500 mg total) by mouth 2 (two) times daily. 60 tablet 5  . lisinopril (PRINIVIL,ZESTRIL) 20 MG tablet Take 1 tablet (20 mg total) by mouth daily. 30 tablet 5  . loratadine (CLARITIN) 10 MG tablet Take 1 tablet (10 mg total) by mouth daily. (Patient not taking: Reported on 10/11/2015)     No current facility-administered medications for this visit.    Review of Systems: General: Denies fever, chills, diaphoresis, appetite change and fatigue.  Respiratory: Denies SOB, DOE, cough, and wheezing.   Cardiovascular: Denies chest pain and palpitations.  Gastrointestinal: Denies nausea, vomiting, abdominal pain,  and diarrhea.  Genitourinary: Denies dysuria, increased frequency, and flank pain. Endocrine: Denies hot or cold intolerance, polyuria, and polydipsia. Musculoskeletal: Denies myalgias, back pain, joint swelling, arthralgias and gait problem.  Skin: Denies pallor, rash and wounds.  Neurological: Denies dizziness, seizures, syncope, weakness, lightheadedness, numbness and headaches.  Psychiatric/Behavioral: Denies mood changes, and sleep disturbances.  Objective:   Physical Exam: Filed Vitals:   10/15/15 0940 10/15/15 0947  BP: 179/86 155/85  Pulse: 99 89  Temp: 98 F (36.7 C)   TempSrc: Oral   Height: 4\' 10"  (1.473 m)   Weight: 116 lb 14.4 oz (53.025 kg)   SpO2: 100%     General: Alert, cooperative, NAD. HEENT: PERRL, EOMI. Moist mucus membranes Neck: Full range of motion without pain, supple, no lymphadenopathy or carotid bruits Lungs: Clear to ascultation bilaterally, normal work of respiration, no wheezes, rales, rhonchi Heart: RRR. 2/6 systolic murmur heard loudest over the left parasternal border. No gallops, or rubs Abdomen: Soft, non-tender, non-distended, BS + Extremities: No cyanosis, clubbing, or edema Neurologic: Alert & oriented X3, cranial nerves II-XII intact, strength grossly intact, sensation intact to light touch   Assessment & Plan:   Please see problem based assessment and plan.

## 2015-10-15 NOTE — Assessment & Plan Note (Addendum)
Patient is very reluctant to undergo any screening tests, seems to be mostly related to her distrust of the healthcare system. Says she received a pneumonia vaccine in the past and is sure this is how she got "double pneumonia". Once again encouraged her to undergo a simple screening mammogram and she says she will "think about it". Is not interested in flu shot or colonoscopy at this time either. Would continue to encourage patient to undergo these screening tests given her good overall health.

## 2015-10-15 NOTE — Assessment & Plan Note (Signed)
BP Readings from Last 3 Encounters:  10/15/15 155/85  10/11/15 144/72  09/17/15 165/76    Lab Results  Component Value Date   NA 138 09/17/2015   K 4.0 09/17/2015   CREATININE 0.95 09/17/2015    Assessment: Blood pressure control:  Elevated Comments: Taking Lisinopril 20 mg daily + HCTZ 25 mg daily.   Plan: Medications:  Increase Lisinopril to 40 mg daily. Continue HCTZ 25 mg daily. Given that patient is [place], unlikely that she is going to be normotensive with increased ACEI. Will likely need addition of another agent at next clinic visit, could consider CCB. EOther plans: RTC in 4 weeks

## 2015-11-26 ENCOUNTER — Ambulatory Visit: Payer: Medicare Other | Admitting: Internal Medicine

## 2015-12-17 ENCOUNTER — Ambulatory Visit (INDEPENDENT_AMBULATORY_CARE_PROVIDER_SITE_OTHER): Payer: Self-pay | Admitting: Internal Medicine

## 2015-12-17 ENCOUNTER — Encounter: Payer: Self-pay | Admitting: Internal Medicine

## 2015-12-17 VITALS — BP 142/82 | HR 100 | Temp 98.0°F | Ht <= 58 in | Wt 122.5 lb

## 2015-12-17 DIAGNOSIS — I10 Essential (primary) hypertension: Secondary | ICD-10-CM

## 2015-12-17 DIAGNOSIS — R2689 Other abnormalities of gait and mobility: Secondary | ICD-10-CM

## 2015-12-17 DIAGNOSIS — G40909 Epilepsy, unspecified, not intractable, without status epilepticus: Secondary | ICD-10-CM

## 2015-12-17 DIAGNOSIS — R569 Unspecified convulsions: Secondary | ICD-10-CM

## 2015-12-17 DIAGNOSIS — M24559 Contracture, unspecified hip: Secondary | ICD-10-CM | POA: Insufficient documentation

## 2015-12-17 DIAGNOSIS — M79652 Pain in left thigh: Secondary | ICD-10-CM

## 2015-12-17 DIAGNOSIS — M24551 Contracture, right hip: Secondary | ICD-10-CM

## 2015-12-17 LAB — BASIC METABOLIC PANEL
ANION GAP: 10 (ref 5–15)
BUN: 16 mg/dL (ref 6–20)
CO2: 30 mmol/L (ref 22–32)
Calcium: 9.7 mg/dL (ref 8.9–10.3)
Chloride: 99 mmol/L — ABNORMAL LOW (ref 101–111)
Creatinine, Ser: 1.01 mg/dL — ABNORMAL HIGH (ref 0.44–1.00)
GFR, EST NON AFRICAN AMERICAN: 54 mL/min — AB (ref >60)
Glucose, Bld: 107 mg/dL — ABNORMAL HIGH (ref 65–99)
POTASSIUM: 3.6 mmol/L (ref 3.5–5.1)
SODIUM: 139 mmol/L (ref 135–145)

## 2015-12-17 MED ORDER — LEVETIRACETAM 500 MG PO TABS: 500.0000 mg | ORAL_TABLET | Freq: Two times a day (BID) | ORAL | Status: DC

## 2015-12-17 MED ORDER — LISINOPRIL 40 MG PO TABS: 40.0000 mg | ORAL_TABLET | Freq: Every day | ORAL | Status: DC

## 2015-12-17 NOTE — Assessment & Plan Note (Signed)
Assessment She has been seizure-free while on Keppra 500 mg to be taken twice daily. She would like a refill this medication today. She is to follow-up with neurology in the next 3-6 months.  Plan -Refilled Keppra 500 mg to be taken twice daily

## 2015-12-17 NOTE — Assessment & Plan Note (Addendum)
Assessment Her initial blood pressure was elevated today at 166/81 though improved on manual recheck to 142/82. She is currently on lisinopril 40 mg and HCTZ 25 mg. She does not report any signs of elevated blood pressure, like blurry vision, headache, chest pain, difficulty breathing.  Plan -Continue HCTZ 25 mg to be taken daily along with lisinopril 40 mg to be taken daily -Check BMET today  ADDENDUM 12/21/2015  11:50 AM:  Crt 1.0 which is at baseline 0.9-1.0.

## 2015-12-17 NOTE — Assessment & Plan Note (Signed)
Assessment For the past year, she has noted pain over left thigh. It is made worse after she tries to stand up from sitting for prolonged periods of time, especially when she tries to ambulate. She denies any injury or falls may precipitate this pain. There is no association with associated radicular symptoms, like numbness or tingling. She finds some relief with naproxen 500 mg taken 1-2 times daily. She has been trying leg lift exercises on her own with some relief of her discomfort.   Physical exam findings are reassuring for a muscle related process, likely decreased flexibility of her hip flexors. Observation of her gait does suggest this as standing after sitting for several minutes is somewhat difficult for her.  Plan -Recommended conservative therapy with heating/icing, naproxen therapy as needed, as well as strengthening exercises of the hip flexors which I printed off for her to perform at her leisure -Recommended she undergo DEXA scan once she has Medicare coverage established in 2 months at which time she will follow up with her PCP for referral. She understands her age puts her at risk for hip fractures and would like to do what she can to maintain her current functional status

## 2015-12-17 NOTE — Progress Notes (Signed)
   Subjective:    Patient ID: Caitlin Cervantes, female    DOB: 07/25/1940, 75 y.o.   MRN: [id number]  HPI [name] is a 75 year old female who presents today for blood pressure recheck. Please see assessment & plan for status of chronic medical problems.    Review of Systems     Objective:   Physical Exam  Constitutional: She is oriented to person, place, and time. She appears well-developed and well-nourished.  HENT:  Head: Normocephalic and atraumatic.  Eyes: Conjunctivae are normal. No scleral icterus.  Cardiovascular: Normal rate and regular rhythm.   Pulmonary/Chest: Effort normal and breath sounds normal.  Musculoskeletal:  Mild tenderness with palpation overlying the lateral thigh. Hip abduction without limited tension in the range of motion. Hip extension/flexion 5 out of 5 bilaterally. Straight leg test without signs of radiculopathy.  Neurological: She is alert and oriented to person, place, and time.          Assessment & Plan:

## 2015-12-23 NOTE — Progress Notes (Signed)
Internal Medicine Clinic Attending  Case discussed with Dr. Patel,Rushil at the time of the visit.  We reviewed the resident's history and exam and pertinent patient test results.  I agree with the assessment, diagnosis, and plan of care documented in the resident's note.  

## 2016-03-09 ENCOUNTER — Encounter: Payer: Self-pay | Admitting: Internal Medicine

## 2016-04-12 ENCOUNTER — Ambulatory Visit: Payer: Medicare Other | Admitting: Adult Health

## 2016-05-11 ENCOUNTER — Ambulatory Visit: Payer: Medicare Other | Admitting: Adult Health

## 2016-05-18 ENCOUNTER — Encounter: Payer: Self-pay | Admitting: Adult Health

## 2016-05-18 ENCOUNTER — Ambulatory Visit (INDEPENDENT_AMBULATORY_CARE_PROVIDER_SITE_OTHER): Payer: Medicare Other | Admitting: Adult Health

## 2016-05-18 DIAGNOSIS — R569 Unspecified convulsions: Secondary | ICD-10-CM

## 2016-05-18 MED ORDER — LEVETIRACETAM 500 MG PO TABS: 500.0000 mg | ORAL_TABLET | Freq: Two times a day (BID) | ORAL | 11 refills | Status: DC

## 2016-05-18 NOTE — Progress Notes (Signed)
PATIENT: Caitlin Cervantes DOB: [birth year]  REASON FOR VISIT: follow up-seizures HISTORY FROM: patient  HISTORY OF PRESENT ILLNESS: Caitlin Cervantes is a 75 year old female with a history of seizures. She returns today for follow-up. She's been taking Keppra 500 mg twice a day. She denies any seizure events. Denies any changes with her gait or balance. She lives at home alone. Able to complete all ADLs independently. She is not operating a motor vehicle without difficulty. She continues to have issues with arthritis that affects her gait specifically on the right side. She returns today for an evaluation.  HISTORY 10/11/15: Caitlin Cervantes is a 75 year old female with a history of possible seizure events. She returns today for follow-up. The patient has been taking Keppra 500 mg twice a day. She reports that since starting this medication she's not had any additional syncopal episodes. She states that she is actually feeling a lot better on the medication. She lives at home alone. She is able to complete all ADLs independentely. She is not operating a motor vehicle at this time. She states that she has tolerated the medication well. Denies any changes with her gait or balance. She states that she does have arthritis in the right leg which affects her gait. She denies any new neurological symptoms. She returns today for an evaluation.  HISTORY 06/08/15 (WILLIS): Caitlin Cervantes is a 75 year old right-handed black female with a history of a syncopal event that occurred on 05/22/2015. The patient had just gone to the grocery store, she returned home and was unpacking her grocery bags when she suddenly lost consciousness without warning and hit the floor. There is no report of jerking or stiffening with this event. The patient did not bite her tongue or lose control the bowels or the bladder. The patient went to the hospital for an evaluation. MRI of the brain showed no acute changes, but a moderate level small vessel  disease was noted. A 2-D echocardiogram of the heart was relatively unremarkable. An EEG study was done and this was normal. The patient was seen by neurology, and the history was taken that the patient has had at least 3 prior events of transient loss of awareness, at least one event occurred while the patient was standing and she did not fall down. For this reason, Keppra was added to the regimen for presumed seizures. She was asked not to drive for at least 6 months. She returns to this office for an evaluation. There is no family history of seizures.  REVIEW OF SYSTEMS: Out of a complete 14 system review of symptoms, the patient complains only of the following symptoms, and all other reviewed systems are negative.  See history of present illness  ALLERGIES: No Known Allergies  HOME MEDICATIONS: Outpatient Medications Prior to Visit  Medication Sig Dispense Refill  . aspirin EC 81 MG tablet Take 81 mg by mouth at bedtime.    . hydrochlorothiazide (HYDRODIURIL) 25 MG tablet Take 1 tablet (25 mg total) by mouth daily. 30 tablet 11  . KRILL OIL PO Take 1 capsule by mouth at bedtime.    . levETIRAcetam (KEPPRA) 500 MG tablet Take 1 tablet (500 mg total) by mouth 2 (two) times daily. 60 tablet 5  . lisinopril (PRINIVIL,ZESTRIL) 40 MG tablet Take 1 tablet (40 mg total) by mouth daily. 30 tablet 5  . loratadine (CLARITIN) 10 MG tablet Take 1 tablet (10 mg total) by mouth daily.    Marland Kitchen atorvastatin (LIPITOR) 40 MG tablet Take 1  tablet (40 mg total) by mouth daily at 6 PM. (Patient not taking: Reported on 05/18/2016) 30 tablet 0   No facility-administered medications prior to visit.     PAST MEDICAL HISTORY: Past Medical History:  Diagnosis Date  . Hypercholesteremia   . Hypertension   . Seizure ([place])     PAST SURGICAL HISTORY: Past Surgical History:  Procedure Laterality Date  . LUNG LOBECTOMY     TB  . TONSILLECTOMY    . TUBAL LIGATION Bilateral     FAMILY HISTORY: Family History    Problem Relation Age of Onset  . Hypertension Father   . Seizures Neg Hx     SOCIAL HISTORY: Social History   Social History  . Marital status: Single    Spouse name: N/A  . Number of children: 1  . Years of education: HS   Occupational History  . retired    Social History Main Topics  . Smoking status: Never Smoker  . Smokeless tobacco: Never Used  . Alcohol use No  . Drug use: No  . Sexual activity: Not on file   Other Topics Concern  . Not on file   Social History Narrative   Patient rarely drinks caffeine.   Patient is right handed.       PHYSICAL EXAM  Vitals:   05/18/16 0841  BP: (!) 160/94  Pulse: 90  Resp: 18  Weight: 123 lb 8 oz (56 kg)  Height: 4\' 10"  (1.473 m)   Body mass index is 25.81 kg/m.  Generalized: Well developed, in no acute distress   Neurological examination  Mentation: Alert oriented to time, place, history taking. Follows all commands speech and language fluent Cranial nerve II-XII: Pupils were equal round reactive to light. Extraocular movements were full, visual field were full on confrontational test. Facial sensation and strength were normal. Uvula tongue midline. Head turning and shoulder shrug  were normal and symmetric. Motor: The motor testing reveals 5 over 5 strength of all 4 extremities. Good symmetric motor tone is noted throughout.  Sensory: Sensory testing is intact to soft touch on all 4 extremities. No evidence of extinction is noted.  Coordination: Cerebellar testing reveals good finger-nose-finger and heel-to-shin bilaterally.  Gait and station: Abnormal gait on the right due to arthritis. Tandem gait not attempted Reflexes: Deep tendon reflexes are symmetric and normal bilaterally.   DIAGNOSTIC DATA (LABS, IMAGING, TESTING) - I reviewed patient records, labs, notes, testing and imaging myself where available.  Lab Results  Component Value Date   WBC 8.3 05/22/2015   HGB 13.6 05/22/2015   HCT 40.0 05/22/2015    MCV 83.8 05/22/2015   PLT 402 (H) 05/22/2015      Component Value Date/Time   NA 139 12/17/2015 0955   NA 138 09/17/2015 0955   K 3.6 12/17/2015 0955   CL 99 (L) 12/17/2015 0955   CO2 30 12/17/2015 0955   GLUCOSE 107 (H) 12/17/2015 0955   BUN 16 12/17/2015 0955   BUN 15 09/17/2015 0955   CREATININE 1.01 (H) 12/17/2015 0955   CALCIUM 9.7 12/17/2015 0955   PROT 7.5 05/22/2015 1308   ALBUMIN 3.8 05/22/2015 1308   AST 34 05/22/2015 1308   ALT 13 (L) 05/22/2015 1308   ALKPHOS 100 05/22/2015 1308   BILITOT 0.3 05/22/2015 1308   GFRNONAA 54 (L) 12/17/2015 0955   GFRAA >60 12/17/2015 0955   Lab Results  Component Value Date   CHOL 232 (H) 05/23/2015   HDL 59 05/23/2015  LDLCALC 157 (H) 05/23/2015   TRIG 81 05/23/2015   CHOLHDL 3.9 05/23/2015   Lab Results  Component Value Date   HGBA1C 6.1 (H) 05/22/2015   Lab Results  Component Value Date   LXBWIOMB55 9741 (H) 03/03/2008   Lab Results  Component Value Date   TSH 0.385 05/22/2015      ASSESSMENT AND PLAN 75 y.o. year old female  has a past medical history of Hypercholesteremia; Hypertension; and Seizure ([place]). here with :  1. Seizures  Overall the patient is doing well. She will continue on Keppra. Advised patient that if she has any seizure events she should let us know. She will follow-up in one year or sooner if needed.     Ward Givens, MSN, NP-C 05/18/2016, 9:19 AM [facility] [address] [place], [place] [address] [phone]

## 2016-05-18 NOTE — Patient Instructions (Signed)
Continue keppra If you have any seizure events please let us know.

## 2016-05-18 NOTE — Progress Notes (Signed)
I have read the note, and I agree with the clinical assessment and plan.  Caitlin Cervantes   

## 2016-09-28 ENCOUNTER — Telehealth: Payer: Self-pay | Admitting: Internal Medicine

## 2016-09-28 NOTE — Telephone Encounter (Signed)
APT. REMINDER CALL, LMTCB °

## 2016-09-29 ENCOUNTER — Ambulatory Visit (INDEPENDENT_AMBULATORY_CARE_PROVIDER_SITE_OTHER): Payer: Medicare Other | Admitting: Internal Medicine

## 2016-09-29 VITALS — BP 162/77 | HR 82 | Temp 98.8°F | Wt 126.6 lb

## 2016-09-29 DIAGNOSIS — Z79899 Other long term (current) drug therapy: Secondary | ICD-10-CM | POA: Diagnosis not present

## 2016-09-29 DIAGNOSIS — Z760 Encounter for issue of repeat prescription: Secondary | ICD-10-CM | POA: Diagnosis not present

## 2016-09-29 DIAGNOSIS — I10 Essential (primary) hypertension: Secondary | ICD-10-CM

## 2016-09-29 MED ORDER — LISINOPRIL 40 MG PO TABS: 40.0000 mg | ORAL_TABLET | Freq: Every day | ORAL | 2 refills | Status: DC

## 2016-09-29 MED ORDER — HYDROCHLOROTHIAZIDE 25 MG PO TABS: 25.0000 mg | ORAL_TABLET | Freq: Every day | ORAL | 2 refills | Status: DC

## 2016-09-29 MED ORDER — AMLODIPINE BESYLATE 2.5 MG PO TABS: 2.5000 mg | ORAL_TABLET | Freq: Every day | ORAL | 1 refills | Status: DC

## 2016-09-29 NOTE — Patient Instructions (Signed)
Please start taking amlodipine 2.5mg  daily in addition to your other two medications.  Come back and recheck your BP in 1 month.

## 2016-09-29 NOTE — Assessment & Plan Note (Addendum)
Vitals:   09/29/16 0932  BP: (!) 162/77  Pulse: 82  Temp: 98.8 F (37.1 C)   BP remains over target, has always been slightly over target. We will add amlodipine 2.5mg  daily and continue lisinopril and hctz at current dose.   F/up in 1 month. In the future may combine her meds together for convenience.

## 2016-09-29 NOTE — Progress Notes (Signed)
   CC: HTN f/up and refill  HPI:  [name] is a 76 y.o. with pmh as listed below is here for HTN f/up and refill  Past Medical History:  Diagnosis Date  . Hypercholesteremia   . Hypertension   . Seizure ([place])     HTN - lisinopril 40 mg + hctz 25mg . Exercising every morning, eating healthy, taking meds daily, no side effects. BP remains high but she states she is old and has family hx of HTN so that's likely the cause. I explained that we still need to treat it as it has always been slightly over target. She is worried about getting dizzy and wants agreed to take small dose of another med.    Review of Systems:   Review of Systems  Constitutional: Negative for chills and fever.  Eyes: Negative for blurred vision and double vision.  Respiratory: Negative for cough and shortness of breath.   Cardiovascular: Negative for chest pain and palpitations.  Gastrointestinal: Negative for heartburn and nausea.  Genitourinary: Negative for dysuria.  Musculoskeletal: Negative for myalgias.  Neurological: Negative for dizziness and headaches.      Physical Exam:  Vitals:   09/29/16 0932  BP: (!) 162/77  Pulse: 82  Temp: 98.8 F (37.1 C)  TempSrc: Oral  SpO2: 99%  Weight: 126 lb 9.6 oz (57.4 kg)   Physical Exam  Assessment & Plan:   See Encounters Tab for problem based charting.  Patient discussed with Dr. Dareen Piano

## 2016-10-02 NOTE — Progress Notes (Signed)
Internal Medicine Clinic Attending  Case discussed with Dr. Ahmed at the time of the visit.  We reviewed the resident's history and exam and pertinent patient test results.  I agree with the assessment, diagnosis, and plan of care documented in the resident's note. 

## 2016-10-04 ENCOUNTER — Encounter: Payer: Medicare Other | Admitting: Internal Medicine

## 2016-12-08 ENCOUNTER — Ambulatory Visit (INDEPENDENT_AMBULATORY_CARE_PROVIDER_SITE_OTHER): Payer: Medicare Other | Admitting: Internal Medicine

## 2016-12-08 ENCOUNTER — Encounter: Payer: Self-pay | Admitting: Internal Medicine

## 2016-12-08 VITALS — BP 154/77 | HR 87 | Temp 98.6°F | Wt 125.1 lb

## 2016-12-08 DIAGNOSIS — E7849 Other hyperlipidemia: Secondary | ICD-10-CM

## 2016-12-08 DIAGNOSIS — R569 Unspecified convulsions: Secondary | ICD-10-CM

## 2016-12-08 DIAGNOSIS — I10 Essential (primary) hypertension: Secondary | ICD-10-CM

## 2016-12-08 DIAGNOSIS — Z0000 Encounter for general adult medical examination without abnormal findings: Secondary | ICD-10-CM

## 2016-12-08 DIAGNOSIS — M1611 Unilateral primary osteoarthritis, right hip: Secondary | ICD-10-CM

## 2016-12-08 DIAGNOSIS — M1991 Primary osteoarthritis, unspecified site: Secondary | ICD-10-CM

## 2016-12-08 DIAGNOSIS — E785 Hyperlipidemia, unspecified: Secondary | ICD-10-CM

## 2016-12-08 DIAGNOSIS — M4125 Other idiopathic scoliosis, thoracolumbar region: Secondary | ICD-10-CM

## 2016-12-08 DIAGNOSIS — Z79899 Other long term (current) drug therapy: Secondary | ICD-10-CM | POA: Diagnosis not present

## 2016-12-08 DIAGNOSIS — G40909 Epilepsy, unspecified, not intractable, without status epilepticus: Secondary | ICD-10-CM

## 2016-12-08 DIAGNOSIS — I5189 Other ill-defined heart diseases: Secondary | ICD-10-CM

## 2016-12-08 DIAGNOSIS — M1711 Unilateral primary osteoarthritis, right knee: Secondary | ICD-10-CM

## 2016-12-08 DIAGNOSIS — Z8611 Personal history of tuberculosis: Secondary | ICD-10-CM | POA: Insufficient documentation

## 2016-12-08 DIAGNOSIS — E663 Overweight: Secondary | ICD-10-CM

## 2016-12-08 DIAGNOSIS — J301 Allergic rhinitis due to pollen: Secondary | ICD-10-CM | POA: Diagnosis not present

## 2016-12-08 HISTORY — DX: Allergic rhinitis due to pollen: J30.1

## 2016-12-08 HISTORY — DX: Primary osteoarthritis, unspecified site: M19.91

## 2016-12-08 HISTORY — DX: Other idiopathic scoliosis, thoracolumbar region: M41.25

## 2016-12-08 HISTORY — DX: Overweight: E66.3

## 2016-12-08 HISTORY — DX: Other ill-defined heart diseases: I51.89

## 2016-12-08 MED ORDER — ATORVASTATIN CALCIUM 40 MG PO TABS: 40.0000 mg | ORAL_TABLET | Freq: Every day | ORAL | 3 refills | Status: DC

## 2016-12-08 MED ORDER — LISINOPRIL 40 MG PO TABS: 40.0000 mg | ORAL_TABLET | Freq: Every day | ORAL | 3 refills | Status: DC

## 2016-12-08 MED ORDER — AMLODIPINE BESYLATE 2.5 MG PO TABS: 2.5000 mg | ORAL_TABLET | Freq: Every day | ORAL | 3 refills | Status: DC

## 2016-12-08 MED ORDER — HYDROCHLOROTHIAZIDE 25 MG PO TABS: 25.0000 mg | ORAL_TABLET | Freq: Every day | ORAL | 3 refills | Status: DC

## 2016-12-08 NOTE — Assessment & Plan Note (Signed)
Assessment  She has not had any staring episodes for quite some time. This is since starting the Keppra 500 mg by mouth twice daily. She is tolerating this medication well without side effects. She was seen by neurology last October who felt she was doing so well she did not need to follow-up with them for one year.  Plan  We will continue the Keppra 500 milligrams by mouth twice daily. She will follow-up with neurology in October as scheduled. We will reassess for evidence of seizure activity while on the Keppra at the follow-up visit.

## 2016-12-08 NOTE — Progress Notes (Signed)
   Subjective:    Patient ID: Caitlin Cervantes, female    DOB: [birth year], 75 y.o.   MRN: [id number]  HPI  Caitlin Cervantes is here for follow-up of her presumed seizure disorder, essential hypertension, hyperlipidemia, osteoarthritis of the right lower extremity, seasonal allergic rhinitis secondary to pollen, and to establish with a new primary care provider. Please see the A&P for the status of the pt's chronic medical problems.  Review of Systems  Constitutional: Negative for activity change, appetite change, fatigue and unexpected weight change.  HENT: Negative for congestion, postnasal drip, rhinorrhea, sinus pain, sinus pressure, sneezing and sore throat.   Eyes: Negative for discharge, redness and itching.  Respiratory: Negative for cough, chest tightness, shortness of breath and wheezing.   Cardiovascular: Negative for chest pain, palpitations and leg swelling.  Gastrointestinal: Negative for abdominal distention, abdominal pain, blood in stool, constipation, diarrhea, nausea and vomiting.  Musculoskeletal: Positive for arthralgias and myalgias. Negative for back pain, gait problem, joint swelling, neck pain and neck stiffness.       RLE arthralgias and myalgias.  Skin: Negative for color change, rash and wound.  Allergic/Immunologic: Negative for environmental allergies.  Neurological: Negative for seizures, syncope and weakness.  Psychiatric/Behavioral: Negative for dysphoric mood. The patient is not nervous/anxious.       Objective:   Physical Exam  Constitutional: She is oriented to person, place, and time. She appears well-developed and well-nourished. No distress.  HENT:  Head: Normocephalic and atraumatic.  Eyes: Conjunctivae are normal. Right eye exhibits no discharge. Left eye exhibits no discharge. No scleral icterus.  Cardiovascular: Normal rate, regular rhythm and normal heart sounds.  Exam reveals no gallop and no friction rub.   No murmur heard. Pulmonary/Chest:  Effort normal and breath sounds normal. No respiratory distress. She has no wheezes. She has no rales.  Abdominal: Soft. Bowel sounds are normal. She exhibits no distension. There is no tenderness. There is no rebound and no guarding.  Musculoskeletal: Normal range of motion. She exhibits no edema, tenderness or deformity.  Neurological: She is alert and oriented to person, place, and time. She exhibits normal muscle tone.  Loss of intrinsic hand muscle of the right between the thumb and the index finger.  Skin: Skin is warm and dry. No rash noted. She is not diaphoretic. No erythema.  Psychiatric: She has a normal mood and affect. Her behavior is normal. Judgment and thought content normal.  Nursing note and vitals reviewed.     Assessment & Plan:   Please see problem based charting.

## 2016-12-08 NOTE — Assessment & Plan Note (Signed)
Assessment  She has not been taking her statin therapy, but given her hypertension and age, she is at risk for cardiovascular disease within the next 10 years.  Plan  The atorvastatin 40 mg by mouth daily was restarted. The importance of controlling her cholesterol was stressed. We will reassess her tolerance of this high intensity statin therapy at the follow-up visit.

## 2016-12-08 NOTE — Patient Instructions (Addendum)
It was nice to meet you.  I look forward to taking care of you for years to come.  1) Keep taking the medications as you are.  2) I restarted atorvastatin 40 mg daily and amlodipine 2.5 mg daily.  3) I drew blood today and I will call you next week when I get the results.  I will see you in 4 months, sooner if necessary.

## 2016-12-08 NOTE — Assessment & Plan Note (Signed)
She had a previous bad experience with a vaccination and is refusing the Pneumovax and Tdap today. She has not had any bone problems and this does not run in her family. Therefore, she is on interested in a DEXA scan. She is happy to consider mammography and colon cancer screening via a FIT test and will let me know what her decision is at the follow-up visit.

## 2016-12-08 NOTE — Assessment & Plan Note (Signed)
Assessment  Her blood pressure today was elevated at 154/77. This is on hydrochlorothiazide 25 mg by mouth daily and lisinopril 40 mg by mouth daily. Her current goal is less than 140/80.  Plan  We will continue the hydrochlorothiazide at 25 mg by mouth daily and lisinopril at 40 mg by mouth daily. We will restart the amlodipine 2.5 mg by mouth daily and titrate upwards as necessary to reach target. We will reassess her blood pressure at the follow-up visit. Given that she is on hydrochlorothiazide and lisinopril we obtained a basic metabolic panel today and the results are pending at the time of this dictation.

## 2016-12-08 NOTE — Assessment & Plan Note (Signed)
Assessment  She has some mild osteoarthritis of the right hip and knee as well as some myalgias in the thigh area. These are long-standing and I suspect are a sequelae of her thoracolumbar scoliosis. The symptoms are minor per her report and respond well to as needed BenGay. She is not interested in any other therapy at this time.  Plan  She will continue to use over-the-counter BenGay as needed. We will reassess the efficacy of this therapy and the need for the introduction of any additional medication such as acetaminophen or nonsteroidal anti-inflammatories at the follow-up visit.

## 2016-12-08 NOTE — Assessment & Plan Note (Signed)
Assessment  She has not had any difficulty with her allergic rhinitis secondary to pollen during this most recent season. When she does have symptoms they respond well to loratadine.  Plan  She has loratadine at home should she develop any symptoms of her allergic rhinitis. We will reassess the activity of her allergic rhinitis at the follow-up visit.

## 2016-12-09 LAB — BMP8+ANION GAP
ANION GAP: 19 mmol/L — AB (ref 10.0–18.0)
BUN/Creatinine Ratio: 18 (ref 12–28)
BUN: 21 mg/dL (ref 8–27)
CHLORIDE: 101 mmol/L (ref 96–106)
CO2: 22 mmol/L (ref 18–29)
Calcium: 10.2 mg/dL (ref 8.7–10.3)
Creatinine, Ser: 1.19 mg/dL — ABNORMAL HIGH (ref 0.57–1.00)
GFR calc Af Amer: 52 mL/min/{1.73_m2} — ABNORMAL LOW (ref >59)
GFR, EST NON AFRICAN AMERICAN: 45 mL/min/{1.73_m2} — AB (ref >59)
GLUCOSE: 101 mg/dL — AB (ref 65–99)
POTASSIUM: 4.2 mmol/L (ref 3.5–5.2)
Sodium: 142 mmol/L (ref 134–144)

## 2016-12-13 NOTE — Progress Notes (Signed)
Patient ID: Caitlin Cervantes, female   DOB: 08-17-1940, 76 y.o.   MRN: [id number]  BMP: K 4.2, BUN 21, Cr 1.19, Ca 10.2,  eGFR 52  Slight decrease in renal function expected with the initiation of ACEI therapy.  No changes to the regimen now but will repeat the BMP in 6 months rather than 1 year.  I tried multiple times to call [name] to inform her of the results, but did not receive an answer or an identified answering machine.

## 2016-12-27 ENCOUNTER — Encounter: Payer: Self-pay | Admitting: *Deleted

## 2017-01-05 ENCOUNTER — Encounter: Payer: Medicare Other | Admitting: Internal Medicine

## 2017-05-03 ENCOUNTER — Telehealth: Payer: Self-pay | Admitting: Neurology

## 2017-05-03 NOTE — Telephone Encounter (Signed)
Patient called and requested to check her apt. Her Apt has been canceled accidentally by another office. The patient is upset and still wants to come in on the scheduled day 10/26. Please call and advise.

## 2017-05-03 NOTE — Telephone Encounter (Signed)
Ok per Dr Jannifer Franklin to schedule at 12pm on 10/26

## 2017-05-04 ENCOUNTER — Ambulatory Visit (INDEPENDENT_AMBULATORY_CARE_PROVIDER_SITE_OTHER): Payer: Medicare Other | Admitting: Internal Medicine

## 2017-05-04 ENCOUNTER — Encounter: Payer: Self-pay | Admitting: Internal Medicine

## 2017-05-04 VITALS — BP 138/84 | HR 97 | Temp 98.4°F | Wt 124.3 lb

## 2017-05-04 DIAGNOSIS — G40509 Epileptic seizures related to external causes, not intractable, without status epilepticus: Secondary | ICD-10-CM | POA: Diagnosis not present

## 2017-05-04 DIAGNOSIS — I10 Essential (primary) hypertension: Secondary | ICD-10-CM

## 2017-05-04 DIAGNOSIS — E663 Overweight: Secondary | ICD-10-CM

## 2017-05-04 DIAGNOSIS — M1991 Primary osteoarthritis, unspecified site: Secondary | ICD-10-CM | POA: Diagnosis not present

## 2017-05-04 DIAGNOSIS — Z6825 Body mass index (BMI) 25.0-25.9, adult: Secondary | ICD-10-CM | POA: Diagnosis not present

## 2017-05-04 DIAGNOSIS — Z0000 Encounter for general adult medical examination without abnormal findings: Secondary | ICD-10-CM

## 2017-05-04 DIAGNOSIS — E781 Pure hyperglyceridemia: Secondary | ICD-10-CM

## 2017-05-04 DIAGNOSIS — R569 Unspecified convulsions: Secondary | ICD-10-CM | POA: Diagnosis not present

## 2017-05-04 DIAGNOSIS — J301 Allergic rhinitis due to pollen: Secondary | ICD-10-CM | POA: Diagnosis not present

## 2017-05-04 DIAGNOSIS — E785 Hyperlipidemia, unspecified: Secondary | ICD-10-CM | POA: Diagnosis not present

## 2017-05-04 MED ORDER — ATORVASTATIN CALCIUM 40 MG PO TABS
40.0000 mg | ORAL_TABLET | Freq: Every day | ORAL | 3 refills | Status: DC
Start: 2017-05-04 — End: 2018-05-17

## 2017-05-04 MED ORDER — HYDROCHLOROTHIAZIDE 25 MG PO TABS
25.0000 mg | ORAL_TABLET | Freq: Every day | ORAL | 3 refills | Status: DC
Start: 2017-05-04 — End: 2018-05-17

## 2017-05-04 MED ORDER — LEVETIRACETAM 500 MG PO TABS: 500.0000 mg | ORAL_TABLET | Freq: Two times a day (BID) | ORAL | 11 refills | Status: DC

## 2017-05-04 MED ORDER — AMLODIPINE BESYLATE 2.5 MG PO TABS
2.5000 mg | ORAL_TABLET | Freq: Every day | ORAL | 3 refills | Status: DC
Start: 2017-05-04 — End: 2018-05-17

## 2017-05-04 MED ORDER — LISINOPRIL 40 MG PO TABS: 40.0000 mg | ORAL_TABLET | Freq: Every day | ORAL | 3 refills | Status: DC

## 2017-05-04 NOTE — Assessment & Plan Note (Signed)
Assessment  She will occasionally have aches in her right lateral hip area and anterior thigh which responds well to an over-the-counter muscle rub.  Plan  We will continue the as needed over-the-counter muscle rub and reassess the efficacy of this therapy in controlling her symptoms at the follow-up visit.

## 2017-05-04 NOTE — Assessment & Plan Note (Signed)
Assessment  She's had no symptoms consistent with her prior possible seizures while taking the Keppra 500 mg by mouth twice daily.  Plan  We will continue the Keppra at 500 mg by mouth twice daily. We obtained liver function test today and they are pending at the time of this dictation. She already has an appointment scheduled with her neurologist for her annual examination. We will reassess for symptoms consistent with possible seizure activity at the follow-up visit.

## 2017-05-04 NOTE — Assessment & Plan Note (Signed)
She continues to refuse all vaccinations including the flu, Pneumovax, and tetanus/diphtheria. She also is not interested in cancer screening to include a mammography or FIT testing for colon cancer. Finally, she is not interested in assessing bone health at this time. I asked that she call if she were to change her mind on any of these health maintenance issues. We will reassess her interest in receiving preventative health maintenance at the follow-up visit.

## 2017-05-04 NOTE — Assessment & Plan Note (Signed)
Assessment  She has not had the difficulties with her seasonal allergic rhinitis over the last couple of years. She will occasionally need to take Claritin 10 mg by mouth as needed, but this is very infrequent.  Plan  She asked that I keep the loratadine 10 mg by mouth every 24 hours as needed on her medication list in case she does require it in the future. We will reassess the control of her symptoms symptoms at the follow-up visit.

## 2017-05-04 NOTE — Assessment & Plan Note (Signed)
Assessment  She is tolerating her atorvastatin 40 mg by mouth daily without myalgias.  Plan  We will continue this high intensity statin and reassess for intolerances at the follow-up visit.

## 2017-05-04 NOTE — Assessment & Plan Note (Signed)
Assessment  Since the last clinic visit approximately 5 months ago she has lost 1 pound. She has remained very careful with her diet and is completely cut out all sodas.  Plan  We discussed the importance of maintaining her excellent dietary habits and avoiding high calorie drinks. We will reassess her weight at the follow-up visit.

## 2017-05-04 NOTE — Patient Instructions (Signed)
It was good to see you again.  You are doing a great job with your medications.  1) Please keep your healthy lifestyle.  2) Please keep taking your medications as prescribed.  3) Let me know if you change your mind about any screening or vaccinations.  I will see you back in 1 year, sooner if necessary.

## 2017-05-04 NOTE — Progress Notes (Signed)
   Subjective:    Patient ID: Caitlin Cervantes, female    DOB: 05-Nov-1940, 76 y.o.   MRN: [id number]  HPI  Caitlin Cervantes is here for follow-up of her essential hypertension, pure hyperlipidemia, seizure disorder, right lower extremity osteoarthritis, seasonal allergic rhinitis, and overweight status. Please see the A&P for the status of the pt's chronic medical problems.  Review of Systems  Constitutional: Negative for activity change, appetite change and unexpected weight change.  HENT: Negative for postnasal drip, rhinorrhea, sinus pain and sinus pressure.   Respiratory: Negative for chest tightness, shortness of breath and wheezing.   Cardiovascular: Negative for chest pain, palpitations and leg swelling.  Gastrointestinal: Negative for abdominal distention, abdominal pain, constipation, diarrhea, nausea and vomiting.  Genitourinary: Negative for difficulty urinating.  Musculoskeletal: Positive for arthralgias. Negative for back pain, joint swelling and myalgias.       Right lower extremity ache at the anterior thigh and lateral hip  Skin: Negative for rash and wound.  Allergic/Immunologic: Negative for environmental allergies.      Objective:   Physical Exam  Constitutional: She is oriented to person, place, and time. She appears well-developed and well-nourished. No distress.  HENT:  Head: Normocephalic and atraumatic.  Eyes: Conjunctivae are normal. Right eye exhibits no discharge. Left eye exhibits no discharge. No scleral icterus.  Cardiovascular: Normal rate, regular rhythm and normal heart sounds.  Exam reveals no gallop and no friction rub.   No murmur heard. Pulmonary/Chest: Effort normal and breath sounds normal. No respiratory distress. She has no wheezes. She has no rales.  Abdominal: Soft. Bowel sounds are normal. She exhibits no distension. There is no tenderness. There is no rebound and no guarding.  Musculoskeletal: Normal range of motion. She exhibits no edema.    Loss of intrinsic muscles of the right hand  Neurological: She is alert and oriented to person, place, and time. She exhibits normal muscle tone.  Skin: Skin is warm and dry. No rash noted. She is not diaphoretic. No erythema.  Psychiatric: She has a normal mood and affect. Her behavior is normal. Judgment and thought content normal.  Nursing note and vitals reviewed.     Assessment & Plan:   Please see problem oriented charting.

## 2017-05-04 NOTE — Assessment & Plan Note (Addendum)
Assessment  Her blood pressure is well controlled today at 138/84. This is on amlodipine 2.5 mg by mouth daily, lisinopril 40 mg by mouth daily, and hydrochlorothiazide 25 mg by mouth daily.  Plan  We will continue her current antihypertensive regimen of amlodipine 2.5 mg by mouth daily, lisinopril 40 mg by mouth daily, and hydrochlorothiazide 25 mg by mouth daily. A basic metabolic panel was obtained today and is pending at the time of this dictation. We will reassess the blood pressure at the follow-up visit.

## 2017-05-06 LAB — CBC
HEMATOCRIT: 33.9 % — AB (ref 34.0–46.6)
HEMOGLOBIN: 10.8 g/dL — AB (ref 11.1–15.9)
MCH: 28.1 pg (ref 26.6–33.0)
MCHC: 31.9 g/dL (ref 31.5–35.7)
MCV: 88 fL (ref 79–97)
Platelets: 415 10*3/uL — ABNORMAL HIGH (ref 150–379)
RBC: 3.84 x10E6/uL (ref 3.77–5.28)
RDW: 15.4 % (ref 12.3–15.4)
WBC: 7.4 10*3/uL (ref 3.4–10.8)

## 2017-05-06 LAB — CMP14 + ANION GAP
ALK PHOS: 138 IU/L — AB (ref 39–117)
ALT: 16 IU/L (ref 0–32)
AST: 30 IU/L (ref 0–40)
Albumin/Globulin Ratio: 1.6 (ref 1.2–2.2)
Albumin: 4.7 g/dL (ref 3.5–4.8)
Anion Gap: 17 mmol/L (ref 10.0–18.0)
BUN/Creatinine Ratio: 12 (ref 12–28)
BUN: 14 mg/dL (ref 8–27)
CHLORIDE: 99 mmol/L (ref 96–106)
CO2: 23 mmol/L (ref 20–29)
Calcium: 10.1 mg/dL (ref 8.7–10.3)
Creatinine, Ser: 1.19 mg/dL — ABNORMAL HIGH (ref 0.57–1.00)
GFR calc Af Amer: 52 mL/min/{1.73_m2} — ABNORMAL LOW (ref >59)
GFR calc non Af Amer: 45 mL/min/{1.73_m2} — ABNORMAL LOW (ref >59)
GLUCOSE: 104 mg/dL — AB (ref 65–99)
Globulin, Total: 3 g/dL (ref 1.5–4.5)
Potassium: 4.2 mmol/L (ref 3.5–5.2)
Sodium: 139 mmol/L (ref 134–144)
Total Protein: 7.7 g/dL (ref 6.0–8.5)

## 2017-05-07 NOTE — Telephone Encounter (Signed)
Called and spoke with patient. Advised Dr Jannifer Franklin okay to see her on 05/18/17 at 12pm, check in 1130am. She declined stating she saw her PCP on 05/04/17 and got refills on her seizure medications. I did explain that she needs to be seen once a year at our office to continue receiving refills. She verbalized understanding and stated her PCP is going to refill her meds. Nothing further needed.

## 2017-05-08 NOTE — Progress Notes (Signed)
Patient ID: Caitlin Cervantes, female   DOB: 21-Feb-1941, 76 y.o.   MRN: [id number]  CBC: Hgb 10.8, Hct 33.9, WBC 7.4, plts 415, MCV 88  Mild anemia of unclear etiology.  Neither microcytic or macrocytic.  We will repeat CBC at next visit to see if improved, stable, or worsened, but no further work-up necessary at this moment.  LFTs: Unremarkable except for a mildly elevated alk phos at 138.  Asymptomatic.  We will repeat LFTs at the follow-up visit.  BMP: BUN 14, Cr 1.19, eGFR 52.  K 4.2, Ca 10.1.  Stable renal function.  Will continue the lisinopril for renal protection and continue to address the cardiovascular risk factors of hypertension and hyperlipidemia.  Ms. Karis was called with these results and agrees with the plan for follow-up.

## 2017-05-18 ENCOUNTER — Ambulatory Visit: Payer: Medicare Other | Admitting: Neurology

## 2017-10-19 ENCOUNTER — Other Ambulatory Visit: Payer: Self-pay | Admitting: Internal Medicine

## 2017-10-19 DIAGNOSIS — E7849 Other hyperlipidemia: Secondary | ICD-10-CM

## 2017-10-19 DIAGNOSIS — E7800 Pure hypercholesterolemia, unspecified: Secondary | ICD-10-CM

## 2017-10-19 DIAGNOSIS — I10 Essential (primary) hypertension: Secondary | ICD-10-CM

## 2017-11-20 ENCOUNTER — Other Ambulatory Visit: Payer: Self-pay | Admitting: Internal Medicine

## 2017-11-20 DIAGNOSIS — I10 Essential (primary) hypertension: Secondary | ICD-10-CM

## 2017-11-20 DIAGNOSIS — E7849 Other hyperlipidemia: Secondary | ICD-10-CM

## 2018-04-12 ENCOUNTER — Other Ambulatory Visit: Payer: Self-pay | Admitting: Internal Medicine

## 2018-04-12 DIAGNOSIS — R569 Unspecified convulsions: Secondary | ICD-10-CM

## 2018-05-17 ENCOUNTER — Encounter: Payer: Self-pay | Admitting: Internal Medicine

## 2018-05-17 ENCOUNTER — Ambulatory Visit (INDEPENDENT_AMBULATORY_CARE_PROVIDER_SITE_OTHER): Payer: Medicare Other | Admitting: Internal Medicine

## 2018-05-17 VITALS — BP 136/57 | HR 93 | Temp 98.4°F | Wt 134.6 lb

## 2018-05-17 DIAGNOSIS — E7800 Pure hypercholesterolemia, unspecified: Secondary | ICD-10-CM

## 2018-05-17 DIAGNOSIS — G40909 Epilepsy, unspecified, not intractable, without status epilepticus: Secondary | ICD-10-CM

## 2018-05-17 DIAGNOSIS — D649 Anemia, unspecified: Secondary | ICD-10-CM | POA: Diagnosis not present

## 2018-05-17 DIAGNOSIS — N183 Chronic kidney disease, stage 3 unspecified: Secondary | ICD-10-CM

## 2018-05-17 DIAGNOSIS — E663 Overweight: Secondary | ICD-10-CM

## 2018-05-17 DIAGNOSIS — M1991 Primary osteoarthritis, unspecified site: Secondary | ICD-10-CM | POA: Diagnosis not present

## 2018-05-17 DIAGNOSIS — E781 Pure hyperglyceridemia: Secondary | ICD-10-CM | POA: Diagnosis not present

## 2018-05-17 DIAGNOSIS — J302 Other seasonal allergic rhinitis: Secondary | ICD-10-CM

## 2018-05-17 DIAGNOSIS — Z6828 Body mass index (BMI) 28.0-28.9, adult: Secondary | ICD-10-CM

## 2018-05-17 DIAGNOSIS — I10 Essential (primary) hypertension: Secondary | ICD-10-CM

## 2018-05-17 DIAGNOSIS — J301 Allergic rhinitis due to pollen: Secondary | ICD-10-CM

## 2018-05-17 DIAGNOSIS — R011 Cardiac murmur, unspecified: Secondary | ICD-10-CM | POA: Diagnosis not present

## 2018-05-17 DIAGNOSIS — R569 Unspecified convulsions: Secondary | ICD-10-CM | POA: Diagnosis not present

## 2018-05-17 DIAGNOSIS — Z79899 Other long term (current) drug therapy: Secondary | ICD-10-CM

## 2018-05-17 DIAGNOSIS — E785 Hyperlipidemia, unspecified: Secondary | ICD-10-CM

## 2018-05-17 DIAGNOSIS — Z0000 Encounter for general adult medical examination without abnormal findings: Secondary | ICD-10-CM

## 2018-05-17 MED ORDER — LISINOPRIL 40 MG PO TABS: 40.0000 mg | ORAL_TABLET | Freq: Every day | ORAL | 3 refills | Status: DC

## 2018-05-17 MED ORDER — AMLODIPINE BESYLATE 2.5 MG PO TABS: 2.5000 mg | ORAL_TABLET | Freq: Every day | ORAL | 3 refills | Status: DC

## 2018-05-17 MED ORDER — ATORVASTATIN CALCIUM 40 MG PO TABS: 40.0000 mg | ORAL_TABLET | Freq: Every day | ORAL | 3 refills | Status: DC

## 2018-05-17 MED ORDER — LEVETIRACETAM 500 MG PO TABS: 500.0000 mg | ORAL_TABLET | Freq: Two times a day (BID) | ORAL | 3 refills | Status: DC

## 2018-05-17 MED ORDER — HYDROCHLOROTHIAZIDE 25 MG PO TABS
25.0000 mg | ORAL_TABLET | Freq: Every day | ORAL | 3 refills | Status: DC
Start: 2018-05-17 — End: 2019-05-07

## 2018-05-17 NOTE — Assessment & Plan Note (Signed)
Assessment  She is tolerating the atorvastatin 40 mg by mouth daily without myalgias.  Plan  We will continue this high intensity statin and reassess for intolerances at the follow-up visit.

## 2018-05-17 NOTE — Assessment & Plan Note (Signed)
She continues to refuse any vaccinations or other screenings.  I asked her to contact the clinic if she were to change her mind.  We will reassess her readiness for vaccinations or screenings at the follow-up visit.

## 2018-05-17 NOTE — Patient Instructions (Signed)
It was great to see you again!  1) I drew some blood from you today.  I will call with the results early next week.  2) I renewed all of your medications today.  3) Keep taking your medications as you are.  4) You can take aleve up to 2 tablets a day.  If this works better for you call me and I will send in a prescription.  I will see you back in 1 year, sooner if necessary.

## 2018-05-17 NOTE — Assessment & Plan Note (Signed)
Assessment  Her blood pressure today is at target at 136/57.  This is on hydrochlorothiazide 25 mg by mouth daily, lisinopril 40 mg by mouth daily, and amlodipine 2.5 mg by mouth daily.  Plan  We will continue the hydrochlorothiazide to 25 mg by mouth daily, lisinopril at 40 mg by mouth daily, and amlodipine at 2.5 mg by mouth daily.  A basic metabolic panel was obtained today and is pending at the time of this dictation.  We will reassess the efficacy of this regimen and controlling her blood pressure at the follow-up visit.

## 2018-05-17 NOTE — Progress Notes (Signed)
   Subjective:    Patient ID: Caitlin Cervantes, female    DOB: 1941/05/29, 77 y.o.   MRN: [id number]  HPI  Caitlin Cervantes is here for follow-up of her essential hypertension, hyperlipidemia, seizure disorder, seasonal allergic rhinitis, osteoarthritis of her right lower extremity, and overweight status.. Please see the A&P for the status of the pt's chronic medical problems.  Review of Systems  Constitutional: Negative for activity change, appetite change and fever.  HENT: Negative for congestion, postnasal drip, rhinorrhea, sinus pressure, sinus pain and sneezing.   Eyes: Negative for itching.  Respiratory: Negative for chest tightness and shortness of breath.   Cardiovascular: Negative for chest pain, palpitations and leg swelling.  Gastrointestinal: Negative for abdominal distention, abdominal pain, constipation, diarrhea, nausea and vomiting.  Genitourinary: Negative for difficulty urinating and dysuria.  Musculoskeletal: Positive for arthralgias. Negative for back pain, joint swelling and myalgias.  Skin: Negative for rash.  Neurological: Negative for seizures.  Psychiatric/Behavioral: Negative for decreased concentration, dysphoric mood and suicidal ideas.      Objective:   Physical Exam  Constitutional: She is oriented to person, place, and time. She appears well-developed and well-nourished. No distress.  HENT:  Head: Normocephalic and atraumatic.  Eyes: Conjunctivae are normal. Right eye exhibits no discharge. Left eye exhibits no discharge. No scleral icterus.  Cardiovascular: Normal rate and regular rhythm. Exam reveals no gallop and no friction rub.  Murmur heard. Pulmonary/Chest: Effort normal and breath sounds normal. No stridor. No respiratory distress. She has no wheezes. She has no rales.  Abdominal: Soft. Bowel sounds are normal. She exhibits no distension. There is no tenderness. There is no rebound and no guarding.  Musculoskeletal: Normal range of motion. She  exhibits no edema, tenderness or deformity.  Neurological: She is alert and oriented to person, place, and time. She exhibits normal muscle tone.  Skin: Skin is warm and dry. No rash noted. She is not diaphoretic. No erythema.  Psychiatric: She has a normal mood and affect. Her behavior is normal. Judgment and thought content normal.  Nursing note and vitals reviewed.     Assessment & Plan:   Please see problem oriented charting.

## 2018-05-17 NOTE — Assessment & Plan Note (Signed)
Assessment  She no longer has seasonal allergic rhinitis symptoms and has not required her Claritin for over a year.  She asked that this medication be discontinued.  Plan  We have discontinued the Claritin from her medication list and will resolve this problem as it apparently no longer is active.

## 2018-05-17 NOTE — Assessment & Plan Note (Signed)
Assessment  She has had no seizure activity since the last clinic visit 1 year ago.  This is on the Keppra 500 mg by mouth 2 times daily.  Plan  We will continue the Keppra at 500 mg by mouth 2 times daily.  Liver function tests were obtained during this visit and are pending at the time of this dictation.  We will reassess the efficacy of this antiseizure regimen at the follow-up visit.

## 2018-05-17 NOTE — Assessment & Plan Note (Signed)
Assessment  She notes continued right lower extremity osteoarthritic pain that responds to over-the-counter Aleve.  That said, she feels the control is less than ideal.  Plan  Given the efficacy of the over-the-counter Aleve I asked her to take 2 tablets at once to assess how well that dose worked.  If it works to her satisfaction I asked that she call the clinic and leave that message.  When I received that message I promised her I would write for the prescription strength dose of the Aleve.  I will await her message if 2 tablets was better than 1 tablet as needed.  In the meantime, she was asked to continue the 1 tablet of Aleve with food as needed for right lower extremity pain.

## 2018-05-17 NOTE — Assessment & Plan Note (Signed)
Assessment  Over the last year she has gained 10 pounds.  Plan  She assured me that she was unhappy with this weight gain and will not only watch her portion size but also maintain her active lifestyle.  We will reassess the efficacy of these lifestyle modifications in decreasing her weight back to target at the follow-up visit.

## 2018-05-18 LAB — CBC
HEMATOCRIT: 31.8 % — AB (ref 34.0–46.6)
HEMOGLOBIN: 10.4 g/dL — AB (ref 11.1–15.9)
MCH: 28.4 pg (ref 26.6–33.0)
MCHC: 32.7 g/dL (ref 31.5–35.7)
MCV: 87 fL (ref 79–97)
Platelets: 416 10*3/uL (ref 150–450)
RBC: 3.66 x10E6/uL — ABNORMAL LOW (ref 3.77–5.28)
RDW: 13.6 % (ref 12.3–15.4)
WBC: 7.1 10*3/uL (ref 3.4–10.8)

## 2018-05-18 LAB — CMP14 + ANION GAP
A/G RATIO: 1.5 (ref 1.2–2.2)
ALT: 13 IU/L (ref 0–32)
AST: 29 IU/L (ref 0–40)
Albumin: 4.4 g/dL (ref 3.5–4.8)
Alkaline Phosphatase: 158 IU/L — ABNORMAL HIGH (ref 39–117)
Anion Gap: 18 mmol/L (ref 10.0–18.0)
BUN/Creatinine Ratio: 16 (ref 12–28)
BUN: 22 mg/dL (ref 8–27)
CALCIUM: 9.9 mg/dL (ref 8.7–10.3)
CO2: 22 mmol/L (ref 20–29)
Chloride: 103 mmol/L (ref 96–106)
Creatinine, Ser: 1.4 mg/dL — ABNORMAL HIGH (ref 0.57–1.00)
GFR, EST AFRICAN AMERICAN: 43 mL/min/{1.73_m2} — AB (ref >59)
GFR, EST NON AFRICAN AMERICAN: 37 mL/min/{1.73_m2} — AB (ref >59)
GLUCOSE: 104 mg/dL — AB (ref 65–99)
Globulin, Total: 3 g/dL (ref 1.5–4.5)
Potassium: 4 mmol/L (ref 3.5–5.2)
Sodium: 143 mmol/L (ref 134–144)
TOTAL PROTEIN: 7.4 g/dL (ref 6.0–8.5)

## 2018-05-22 ENCOUNTER — Encounter: Payer: Self-pay | Admitting: Internal Medicine

## 2018-05-22 DIAGNOSIS — N183 Chronic kidney disease, stage 3 unspecified: Secondary | ICD-10-CM

## 2018-05-22 HISTORY — DX: Chronic kidney disease, stage 3 unspecified: N18.30

## 2018-05-22 NOTE — Progress Notes (Addendum)
Patient ID: Caitlin Cervantes, female   DOB: 09/07/1940, 76 y.o.   MRN: [id number]  BMP: Cr 1.4, BUN 22, K 4.0, eGFR 43  Cr increased from 1.19 last year.  I called patient and we decided to repeat the Cr next week when she can arrange transportation to come to the lab.  I gave her the lab hours of M-F 9 AM to 3:30 PM.  If the Cr remains elevated we will continue the evaluation with a renal ultrasound.  While here, next week I will also request a UA to assess for an active sediment.  LFTs: Alk phos 158, otherwise unremarkable.  Will draw a GGT with the repeat BMP to see if the elevated alk phos is of liver origin.  CBC; WBC 7.1, plts 416, Hgb 10.4, Hct 31.8, MCV 87  Chronic normocytic anemia, little changed from last year.  Will continue to monitor, I suspect it may be an anemia of chronic renal disease. 

## 2018-05-22 NOTE — Addendum Note (Signed)
Addended by: Oval Linsey D on: 05/22/2018 04:21 PM   Modules accepted: Orders

## 2018-05-27 ENCOUNTER — Other Ambulatory Visit (INDEPENDENT_AMBULATORY_CARE_PROVIDER_SITE_OTHER): Payer: Medicare Other

## 2018-05-27 DIAGNOSIS — N183 Chronic kidney disease, stage 3 unspecified: Secondary | ICD-10-CM

## 2018-05-27 DIAGNOSIS — E781 Pure hyperglyceridemia: Secondary | ICD-10-CM

## 2018-05-28 LAB — BMP8+ANION GAP
Anion Gap: 19 mmol/L — ABNORMAL HIGH (ref 10.0–18.0)
BUN / CREAT RATIO: 14 (ref 12–28)
BUN: 20 mg/dL (ref 8–27)
CO2: 22 mmol/L (ref 20–29)
Calcium: 9.6 mg/dL (ref 8.7–10.3)
Chloride: 102 mmol/L (ref 96–106)
Creatinine, Ser: 1.42 mg/dL — ABNORMAL HIGH (ref 0.57–1.00)
GFR, EST AFRICAN AMERICAN: 42 mL/min/{1.73_m2} — AB (ref >59)
GFR, EST NON AFRICAN AMERICAN: 36 mL/min/{1.73_m2} — AB (ref >59)
Glucose: 124 mg/dL — ABNORMAL HIGH (ref 65–99)
Potassium: 4.2 mmol/L (ref 3.5–5.2)
SODIUM: 143 mmol/L (ref 134–144)

## 2018-05-28 LAB — GAMMA GT: GGT: 32 IU/L (ref 0–60)

## 2018-06-24 NOTE — Progress Notes (Signed)
GGT was normal suggesting patient's elevated alkaline phosphatase was not of liver origin.  She has had right thigh pain.  Ideally I would get a pelvic and right hip X-ray to assess for bone lesions as a possible explanation for the pain and elevated alkaline phosphatase (Paget's Disease).  When I discussed my concerns with Caitlin Cervantes she was not interested in further evaluation at this time as she was feeling fine.  She was willing to discuss X-rays at the follow-up appointment.  Cr was still elevated at 1.42 with an eGFR of 42.  She was unable to produce a urine to assess for an active sediment.  I again proposed additional imaging with a renal ultrasound, but she again remains uninterested in further evaluation at this time as she believes her kidneys are working fine, despite my concerns.  She will provide a urine at the follow-up visit and I will again recommend a renal ultrasound to further assess.

## 2018-10-20 ENCOUNTER — Encounter: Payer: Self-pay | Admitting: *Deleted

## 2019-05-05 ENCOUNTER — Other Ambulatory Visit: Payer: Self-pay

## 2019-05-05 ENCOUNTER — Ambulatory Visit (INDEPENDENT_AMBULATORY_CARE_PROVIDER_SITE_OTHER): Payer: Medicare Other | Admitting: Student in an Organized Health Care Education/Training Program

## 2019-05-05 VITALS — BP 128/66 | HR 98 | Temp 98.1°F | Wt 128.6 lb

## 2019-05-05 DIAGNOSIS — I10 Essential (primary) hypertension: Secondary | ICD-10-CM | POA: Diagnosis not present

## 2019-05-05 DIAGNOSIS — Z7982 Long term (current) use of aspirin: Secondary | ICD-10-CM

## 2019-05-05 DIAGNOSIS — I129 Hypertensive chronic kidney disease with stage 1 through stage 4 chronic kidney disease, or unspecified chronic kidney disease: Secondary | ICD-10-CM

## 2019-05-05 DIAGNOSIS — R7303 Prediabetes: Secondary | ICD-10-CM | POA: Insufficient documentation

## 2019-05-05 DIAGNOSIS — R569 Unspecified convulsions: Secondary | ICD-10-CM

## 2019-05-05 DIAGNOSIS — E7800 Pure hypercholesterolemia, unspecified: Secondary | ICD-10-CM | POA: Diagnosis not present

## 2019-05-05 DIAGNOSIS — Z79899 Other long term (current) drug therapy: Secondary | ICD-10-CM | POA: Diagnosis not present

## 2019-05-05 DIAGNOSIS — N1831 Chronic kidney disease, stage 3a: Secondary | ICD-10-CM

## 2019-05-05 DIAGNOSIS — M19041 Primary osteoarthritis, right hand: Secondary | ICD-10-CM | POA: Diagnosis not present

## 2019-05-05 DIAGNOSIS — I998 Other disorder of circulatory system: Secondary | ICD-10-CM | POA: Insufficient documentation

## 2019-05-05 DIAGNOSIS — N1832 Chronic kidney disease, stage 3b: Secondary | ICD-10-CM

## 2019-05-05 LAB — POCT GLYCOSYLATED HEMOGLOBIN (HGB A1C): Hemoglobin A1C: 6.1 % — AB (ref 4.0–5.6)

## 2019-05-05 LAB — GLUCOSE, CAPILLARY: Glucose-Capillary: 122 mg/dL — ABNORMAL HIGH (ref 70–99)

## 2019-05-05 NOTE — Assessment & Plan Note (Signed)
History of cerebral microvascular disease likely leading to seizure activity in 2016.  Seen on MRI.  She is on medical management with aspirin 81 mg daily and atorvastatin 40 mg daily.  Has had no further cerebrovascular events.  Plan is to check lipid panel today.  Goal LDL around 70.

## 2019-05-05 NOTE — Assessment & Plan Note (Signed)
History of admission in 2016 for seizure activity likely related to microvascular cerebral events.  Stable, doing well with no further events.  Plan to continue with generic Keppra 500 mg twice daily.

## 2019-05-05 NOTE — Assessment & Plan Note (Signed)
CKD 3B with estimated GFR around 40 about 1 year ago.  Creatinine 1.4.  Likely related to longstanding hypertension.  History of prediabetes with an A1c of 6.1% in 2016.  Plan is to recheck an A1c today.  Recheck BMP today.  Also check urine microalbumin to creatinine ratio.  Rule out paraproteinemia with serum immunofixation and free light chains.  Especially important given history of normocytic anemia, recheck CBC today.

## 2019-05-05 NOTE — Patient Instructions (Signed)
It was very nice seeing you today.  We talked about your hypertension which is under good control.  We talked about your kidney problem, we are checking your blood work today.  We talked about your mild arthritis in your hands.  It is great to know you are doing so well at home.  I am going to send you the results your blood work later this week.  If everything looks stable, we can plan to see him back in the office in 1 year.

## 2019-05-05 NOTE — Assessment & Plan Note (Signed)
Hemoglobin A1c 6.1% in 2016 during work-up for TIA.  Plan to recheck an A1c today.

## 2019-05-05 NOTE — Assessment & Plan Note (Signed)
Blood pressure initially elevated, but repeat blood pressure was at goal.  Plan to continue with lisinopril 40 mg, HCTZ 25 mg, and amlodipine 2.5 mg daily.  Hypertension is complicated by CKD over the last 1 year.  Will check BMP today.

## 2019-05-05 NOTE — Progress Notes (Signed)
   Assessment and Plan:  See Encounters tab for problem-based medical decision making.   __________________________________________________________  HPI:   78 year old woman here for follow-up of hypertension.  Doing well today, no acute complaints.  Reports doing well on her chronic medications.  Has had no recent illnesses, no admissions or ED visits.  Last saw Dr. Eppie Gibson 1 year ago.  Reports excellent quality of life.  Lives in [place] with her mother son.  She is independent in all activities.  She still drives herself.  Exercises a little, but not much.  Denies chest pain or shortness of breath with exertion.  No recent fevers or chills.  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  She reports good nutrition, drinks a lot of water.  Good urine output.  __________________________________________________________  Problem List: Patient Active Problem List   Diagnosis Date Noted  . Stage III chronic kidney disease 05/22/2018    Priority: High  . Ischemic vascular disease 05/05/2019    Priority: Medium  . Prediabetes 05/05/2019    Priority: Medium  . Seizures ([place])     Priority: Medium  . Essential hypertension 05/22/2015    Priority: Medium  . Diastolic dysfunction 62/70/3500    Priority: Low  . Osteoarthritis of right lower extremity 12/08/2016    Priority: Low  . Overweight (BMI 25.0-29.9) 12/08/2016    Priority: Low  . Healthcare maintenance 09/17/2015    Priority: Low  . Hyperlipidemia 05/23/2015    Priority: Low    Medications: Reconciled today in Epic __________________________________________________________  Physical Exam:  Vital Signs: Vitals:   05/05/19 1003  BP: (!) 156/75  Pulse: (!) 109  Temp: 98.1 F (36.7 C)  TempSrc: Oral  SpO2: 100%  Weight: 128 lb 9.6 oz (58.3 kg)    Gen: Well appearing, NAD Neck: No cervical LAD, No thyromegaly or nodules, No JVD. CV: RRR, no murmurs Pulm: Normal effort, CTA  throughout, no wheezing, large left well-healed scar from prior lobectomy Ext: Osteoarthritis in the right hand at the MCP, PIP and DIP joints.

## 2019-05-07 ENCOUNTER — Telehealth: Payer: Self-pay | Admitting: Student in an Organized Health Care Education/Training Program

## 2019-05-07 DIAGNOSIS — I10 Essential (primary) hypertension: Secondary | ICD-10-CM

## 2019-05-07 DIAGNOSIS — R569 Unspecified convulsions: Secondary | ICD-10-CM

## 2019-05-07 DIAGNOSIS — E781 Pure hyperglyceridemia: Secondary | ICD-10-CM

## 2019-05-07 LAB — IFE, PE AND FLC, SERUM
Albumin SerPl Elph-Mcnc: 4 g/dL (ref 2.9–4.4)
Albumin/Glob SerPl: 1.2 (ref 0.7–1.7)
Alpha 1: 0.3 g/dL (ref 0.0–0.4)
Alpha2 Glob SerPl Elph-Mcnc: 0.7 g/dL (ref 0.4–1.0)
B-Globulin SerPl Elph-Mcnc: 1.4 g/dL — ABNORMAL HIGH (ref 0.7–1.3)
Gamma Glob SerPl Elph-Mcnc: 1.1 g/dL (ref 0.4–1.8)
Globulin, Total: 3.5 g/dL (ref 2.2–3.9)
Ig Kappa Free Light Chain: 57.2 mg/L — ABNORMAL HIGH (ref 3.3–19.4)
Ig Lambda Free Light Chain: 23.5 mg/L (ref 5.7–26.3)
IgA/Immunoglobulin A, Serum: 503 mg/dL — ABNORMAL HIGH (ref 64–422)
IgG (Immunoglobin G), Serum: 1114 mg/dL (ref 586–1602)
IgM (Immunoglobulin M), Srm: 113 mg/dL (ref 26–217)
Kappa/Lambda FluidC Ratio: 2.43 — ABNORMAL HIGH (ref 0.26–1.65)
Total Protein: 7.5 g/dL (ref 6.0–8.5)

## 2019-05-07 LAB — LIPID PANEL
Chol/HDL Ratio: 2.7 ratio (ref 0.0–4.4)
Cholesterol, Total: 152 mg/dL (ref 100–199)
HDL: 57 mg/dL (ref >39)
LDL Chol Calc (NIH): 80 mg/dL (ref 0–99)
Triglycerides: 79 mg/dL (ref 0–149)
VLDL Cholesterol Cal: 15 mg/dL (ref 5–40)

## 2019-05-07 LAB — BMP8+ANION GAP
Anion Gap: 18 mmol/L (ref 10.0–18.0)
BUN/Creatinine Ratio: 15 (ref 12–28)
BUN: 27 mg/dL (ref 8–27)
CO2: 19 mmol/L — ABNORMAL LOW (ref 20–29)
Calcium: 9.6 mg/dL (ref 8.7–10.3)
Chloride: 105 mmol/L (ref 96–106)
Creatinine, Ser: 1.86 mg/dL — ABNORMAL HIGH (ref 0.57–1.00)
GFR calc Af Amer: 30 mL/min/{1.73_m2} — ABNORMAL LOW (ref >59)
GFR calc non Af Amer: 26 mL/min/{1.73_m2} — ABNORMAL LOW (ref >59)
Glucose: 123 mg/dL — ABNORMAL HIGH (ref 65–99)
Potassium: 4.7 mmol/L (ref 3.5–5.2)
Sodium: 142 mmol/L (ref 134–144)

## 2019-05-07 LAB — CBC
Hematocrit: 29.1 % — ABNORMAL LOW (ref 34.0–46.6)
Hemoglobin: 10 g/dL — ABNORMAL LOW (ref 11.1–15.9)
MCH: 30.4 pg (ref 26.6–33.0)
MCHC: 34.4 g/dL (ref 31.5–35.7)
MCV: 88 fL (ref 79–97)
Platelets: 394 10*3/uL (ref 150–450)
RBC: 3.29 x10E6/uL — ABNORMAL LOW (ref 3.77–5.28)
RDW: 14.9 % (ref 11.7–15.4)
WBC: 7.8 10*3/uL (ref 3.4–10.8)

## 2019-05-07 MED ORDER — AMLODIPINE BESYLATE 2.5 MG PO TABS: 2.5000 mg | ORAL_TABLET | Freq: Every day | ORAL | 3 refills | Status: DC

## 2019-05-07 MED ORDER — LISINOPRIL 40 MG PO TABS: 40.0000 mg | ORAL_TABLET | Freq: Every day | ORAL | 3 refills | Status: DC

## 2019-05-07 MED ORDER — ATORVASTATIN CALCIUM 40 MG PO TABS: 40.0000 mg | ORAL_TABLET | Freq: Every day | ORAL | 3 refills | Status: DC

## 2019-05-07 MED ORDER — LEVETIRACETAM 500 MG PO TABS: 500.0000 mg | ORAL_TABLET | Freq: Two times a day (BID) | ORAL | 3 refills | Status: DC

## 2019-05-07 MED ORDER — HYDROCHLOROTHIAZIDE 25 MG PO TABS: 25.0000 mg | ORAL_TABLET | Freq: Every day | ORAL | 3 refills | Status: DC

## 2019-05-07 NOTE — Telephone Encounter (Signed)
Spoke with patient today about worsening renal function on labwork. She is asymptomatic, doing well at home. She tells me now that she has been taking Aleve bid for many months, likely contributing to worsening renal function. Other labs look good, no gammopathy, potassium ok, no significant diabetes.   Plan is to stop Aleve now, she can use tylenol to help with arthritis pain. No further NSAIDs. Return to clinic in 3 months for repeat labs and urine microalbumin.

## 2019-07-28 ENCOUNTER — Ambulatory Visit: Payer: Medicare Other | Admitting: Student in an Organized Health Care Education/Training Program

## 2019-07-30 ENCOUNTER — Encounter: Payer: Self-pay | Admitting: *Deleted

## 2019-08-11 ENCOUNTER — Other Ambulatory Visit: Payer: Self-pay | Admitting: Student in an Organized Health Care Education/Training Program

## 2019-08-11 DIAGNOSIS — R569 Unspecified convulsions: Secondary | ICD-10-CM

## 2019-08-11 DIAGNOSIS — I10 Essential (primary) hypertension: Secondary | ICD-10-CM

## 2019-08-11 DIAGNOSIS — E781 Pure hyperglyceridemia: Secondary | ICD-10-CM

## 2019-08-25 ENCOUNTER — Ambulatory Visit: Payer: Medicare Other | Admitting: Student in an Organized Health Care Education/Training Program

## 2019-09-29 ENCOUNTER — Ambulatory Visit (INDEPENDENT_AMBULATORY_CARE_PROVIDER_SITE_OTHER): Payer: Medicare Other | Admitting: Student in an Organized Health Care Education/Training Program

## 2019-09-29 ENCOUNTER — Encounter: Payer: Self-pay | Admitting: Student in an Organized Health Care Education/Training Program

## 2019-09-29 ENCOUNTER — Telehealth: Payer: Self-pay | Admitting: Student in an Organized Health Care Education/Training Program

## 2019-09-29 VITALS — BP 98/47 | HR 105 | Temp 97.7°F | Ht <= 58 in | Wt 114.6 lb

## 2019-09-29 DIAGNOSIS — Z79899 Other long term (current) drug therapy: Secondary | ICD-10-CM | POA: Diagnosis not present

## 2019-09-29 DIAGNOSIS — I129 Hypertensive chronic kidney disease with stage 1 through stage 4 chronic kidney disease, or unspecified chronic kidney disease: Secondary | ICD-10-CM

## 2019-09-29 DIAGNOSIS — N179 Acute kidney failure, unspecified: Secondary | ICD-10-CM

## 2019-09-29 DIAGNOSIS — N183 Chronic kidney disease, stage 3 unspecified: Secondary | ICD-10-CM

## 2019-09-29 DIAGNOSIS — N1831 Chronic kidney disease, stage 3a: Secondary | ICD-10-CM

## 2019-09-29 DIAGNOSIS — I10 Essential (primary) hypertension: Secondary | ICD-10-CM

## 2019-09-29 DIAGNOSIS — D631 Anemia in chronic kidney disease: Secondary | ICD-10-CM

## 2019-09-29 DIAGNOSIS — N184 Chronic kidney disease, stage 4 (severe): Secondary | ICD-10-CM

## 2019-09-29 DIAGNOSIS — E872 Acidosis, unspecified: Secondary | ICD-10-CM

## 2019-09-29 LAB — BASIC METABOLIC PANEL
Anion gap: 17 — ABNORMAL HIGH (ref 5–15)
BUN: 27 mg/dL — ABNORMAL HIGH (ref 8–23)
CO2: 15 mmol/L — ABNORMAL LOW (ref 22–32)
Calcium: 9.3 mg/dL (ref 8.9–10.3)
Chloride: 110 mmol/L (ref 98–111)
Creatinine, Ser: 2.4 mg/dL — ABNORMAL HIGH (ref 0.44–1.00)
GFR calc Af Amer: 22 mL/min — ABNORMAL LOW (ref >60)
GFR calc non Af Amer: 19 mL/min — ABNORMAL LOW (ref >60)
Glucose, Bld: 136 mg/dL — ABNORMAL HIGH (ref 70–99)
Potassium: 3.9 mmol/L (ref 3.5–5.1)
Sodium: 142 mmol/L (ref 135–145)

## 2019-09-29 NOTE — Addendum Note (Signed)
Addended by: Orson Gear on: 09/29/2019 10:21 AM   Modules accepted: Orders

## 2019-09-29 NOTE — Telephone Encounter (Signed)
Empted to call pt, no answer [phone] Attempted to call pt again, it rang and then after appr 10 rings disconnected Called sister's # and son's # lm for son Called pt again, called disconnected again

## 2019-09-29 NOTE — Progress Notes (Signed)
   Assessment and Plan:  See Encounters tab for problem-based medical decision making.   __________________________________________________________  HPI:   79 year old person here for follow-up of chronic kidney disease.  Doing well at home, no issues the last 6 months.  No hospitalizations, no ED visits.  Reports good compliance with her medications.  No falls at home.  No side effects from the medicines.  Lives independently, driving less than she used to.  Has family in the area that is helpful for her.  We talked about the Covid vaccine, she has some hesitancy, I really encouraged her to take it, she may be interested in a one-dose regimen.  __________________________________________________________  Problem List: Patient Active Problem List   Diagnosis Date Noted  . Stage III chronic kidney disease 05/22/2018    Priority: High  . Ischemic vascular disease 05/05/2019    Priority: Medium  . Prediabetes 05/05/2019    Priority: Medium  . Seizures ([place])     Priority: Medium  . Essential hypertension 05/22/2015    Priority: Medium  . Diastolic dysfunction 06/08/5207    Priority: Low  . Osteoarthritis of right lower extremity 12/08/2016    Priority: Low  . Overweight (BMI 25.0-29.9) 12/08/2016    Priority: Low  . Healthcare maintenance 09/17/2015    Priority: Low  . Hyperlipidemia 05/23/2015    Priority: Low    Medications: Reconciled today in Epic __________________________________________________________  Physical Exam:  Vital Signs: Vitals:   09/29/19 0944  BP: (!) 98/47  Pulse: (!) 105  Temp: 97.7 F (36.5 C)  TempSrc: Oral  SpO2: 100%  Weight: 114 lb 9.6 oz (52 kg)  Height: 4\' 10"  (1.473 m)    Gen: Well appearing, NAD Neck: No cervical LAD, No thyromegaly or nodules, No JVD. CV: RRR, no murmurs Pulm: Normal effort, CTA throughout, no wheezing Ext: Warm, no edema, Moderate-severe osteoarthritis of bilateral hands with some muscle atrophy

## 2019-09-29 NOTE — Assessment & Plan Note (Signed)
Blood pressure well controlled, systolic you to a little bit low but not having any symptoms of hypotension or orthostatic changes.  He was high on the last visit.  Plan to continue with hydrochlorothiazide 25, lisinopril 40, and amlodipine 2.5.

## 2019-09-29 NOTE — Patient Instructions (Signed)
Is great seeing you today in clinic.  We are checking blood work today to monitor your kidney function.  I will call you with the results tomorrow.

## 2019-09-29 NOTE — Addendum Note (Signed)
Addended by: Truddie Crumble on: 09/29/2019 11:21 AM   Modules accepted: Orders

## 2019-09-29 NOTE — Telephone Encounter (Signed)
I have tried calling a few times this afternoon. Patient saw me for routine visit this morning, reported no symptoms, labs show worsening renal dysfunction and new anion gap metabolic acidosis. The lab also alerted me that she looked very weak while trying to give a urine specimen. I would like her to come back to clinic as soon as possible for a CBC and repeat labs. She may require admission to find the cause of this worsening renal injury.   I also left a message on her sister's phone, listed as next contact in the chart. Can you help me get her scheduled for asap.

## 2019-09-29 NOTE — Assessment & Plan Note (Addendum)
Patient with chronic kidney disease stage IIIb, last GFR about 6 months ago was around 53.  This represented a pretty quick worsening in her chronic kidney disease over the last 2 years.  Creatinine was 1.8.  She was taking NSAIDs every day, and has stopped doing that for the last 6 months.  Functionally doing very well showing no signs or symptoms of uremia.  Later recheck BMP today.  Also checking urine microalbumin to look for proteinuria.  We will continue with lisinopril 40 mg daily now unless the GFR drops significantly below 30.

## 2019-09-30 ENCOUNTER — Ambulatory Visit (INDEPENDENT_AMBULATORY_CARE_PROVIDER_SITE_OTHER): Payer: Medicare Other | Admitting: Internal Medicine

## 2019-09-30 ENCOUNTER — Other Ambulatory Visit: Payer: Self-pay | Admitting: Physician Assistant

## 2019-09-30 ENCOUNTER — Encounter (HOSPITAL_COMMUNITY): Payer: Self-pay

## 2019-09-30 ENCOUNTER — Inpatient Hospital Stay (HOSPITAL_COMMUNITY)
Admission: EM | Admit: 2019-09-30 | Discharge: 2019-10-02 | DRG: 378 | Disposition: A | Discharge status: Home Health | Payer: Medicare Other | Source: Ambulatory Visit | Attending: Internal Medicine | Admitting: Internal Medicine

## 2019-09-30 ENCOUNTER — Other Ambulatory Visit: Payer: Self-pay

## 2019-09-30 ENCOUNTER — Encounter: Payer: Self-pay | Admitting: Internal Medicine

## 2019-09-30 ENCOUNTER — Inpatient Hospital Stay: Admission: AD | Admit: 2019-09-30 | Payer: Medicare Other | Source: Ambulatory Visit | Admitting: Internal Medicine

## 2019-09-30 ENCOUNTER — Ambulatory Visit (HOSPITAL_COMMUNITY)
Admission: RE | Admit: 2019-09-30 | Discharge: 2019-09-30 | Disposition: A | Discharge status: Home / Self Care | Payer: Medicare Other | Source: Ambulatory Visit | Attending: Internal Medicine | Admitting: Internal Medicine

## 2019-09-30 VITALS — BP 115/46 | HR 109 | Temp 97.8°F | Ht 61.0 in | Wt 115.0 lb

## 2019-09-30 DIAGNOSIS — K222 Esophageal obstruction: Secondary | ICD-10-CM | POA: Diagnosis not present

## 2019-09-30 DIAGNOSIS — E785 Hyperlipidemia, unspecified: Secondary | ICD-10-CM | POA: Diagnosis present

## 2019-09-30 DIAGNOSIS — Z7982 Long term (current) use of aspirin: Secondary | ICD-10-CM | POA: Diagnosis not present

## 2019-09-30 DIAGNOSIS — Z79899 Other long term (current) drug therapy: Secondary | ICD-10-CM

## 2019-09-30 DIAGNOSIS — K449 Diaphragmatic hernia without obstruction or gangrene: Secondary | ICD-10-CM | POA: Diagnosis present

## 2019-09-30 DIAGNOSIS — R569 Unspecified convulsions: Secondary | ICD-10-CM | POA: Diagnosis present

## 2019-09-30 DIAGNOSIS — I130 Hypertensive heart and chronic kidney disease with heart failure and stage 1 through stage 4 chronic kidney disease, or unspecified chronic kidney disease: Secondary | ICD-10-CM | POA: Diagnosis present

## 2019-09-30 DIAGNOSIS — D123 Benign neoplasm of transverse colon: Secondary | ICD-10-CM | POA: Diagnosis not present

## 2019-09-30 DIAGNOSIS — R000 Tachycardia, unspecified: Secondary | ICD-10-CM

## 2019-09-30 DIAGNOSIS — M4125 Other idiopathic scoliosis, thoracolumbar region: Secondary | ICD-10-CM | POA: Diagnosis present

## 2019-09-30 DIAGNOSIS — Z8249 Family history of ischemic heart disease and other diseases of the circulatory system: Secondary | ICD-10-CM | POA: Diagnosis not present

## 2019-09-30 DIAGNOSIS — D509 Iron deficiency anemia, unspecified: Secondary | ICD-10-CM | POA: Diagnosis present

## 2019-09-30 DIAGNOSIS — K5730 Diverticulosis of large intestine without perforation or abscess without bleeding: Secondary | ICD-10-CM | POA: Diagnosis present

## 2019-09-30 DIAGNOSIS — E872 Acidosis, unspecified: Secondary | ICD-10-CM

## 2019-09-30 DIAGNOSIS — K635 Polyp of colon: Secondary | ICD-10-CM | POA: Diagnosis present

## 2019-09-30 DIAGNOSIS — E8729 Other acidosis: Secondary | ICD-10-CM | POA: Diagnosis present

## 2019-09-30 DIAGNOSIS — N179 Acute kidney failure, unspecified: Secondary | ICD-10-CM | POA: Diagnosis present

## 2019-09-30 DIAGNOSIS — Z20822 Contact with and (suspected) exposure to covid-19: Secondary | ICD-10-CM | POA: Diagnosis present

## 2019-09-30 DIAGNOSIS — N1832 Chronic kidney disease, stage 3b: Secondary | ICD-10-CM | POA: Diagnosis present

## 2019-09-30 DIAGNOSIS — F0390 Unspecified dementia without behavioral disturbance: Secondary | ICD-10-CM | POA: Diagnosis present

## 2019-09-30 DIAGNOSIS — K264 Chronic or unspecified duodenal ulcer with hemorrhage: Secondary | ICD-10-CM | POA: Diagnosis present

## 2019-09-30 DIAGNOSIS — D649 Anemia, unspecified: Secondary | ICD-10-CM | POA: Diagnosis not present

## 2019-09-30 DIAGNOSIS — I5032 Chronic diastolic (congestive) heart failure: Secondary | ICD-10-CM | POA: Diagnosis present

## 2019-09-30 DIAGNOSIS — D62 Acute posthemorrhagic anemia: Secondary | ICD-10-CM | POA: Diagnosis present

## 2019-09-30 DIAGNOSIS — K319 Disease of stomach and duodenum, unspecified: Secondary | ICD-10-CM | POA: Diagnosis not present

## 2019-09-30 DIAGNOSIS — D631 Anemia in chronic kidney disease: Secondary | ICD-10-CM | POA: Diagnosis present

## 2019-09-30 DIAGNOSIS — K2980 Duodenitis without bleeding: Secondary | ICD-10-CM | POA: Diagnosis not present

## 2019-09-30 DIAGNOSIS — Z806 Family history of leukemia: Secondary | ICD-10-CM | POA: Diagnosis not present

## 2019-09-30 DIAGNOSIS — K254 Chronic or unspecified gastric ulcer with hemorrhage: Principal | ICD-10-CM | POA: Diagnosis present

## 2019-09-30 DIAGNOSIS — R7303 Prediabetes: Secondary | ICD-10-CM | POA: Diagnosis present

## 2019-09-30 DIAGNOSIS — I129 Hypertensive chronic kidney disease with stage 1 through stage 4 chronic kidney disease, or unspecified chronic kidney disease: Secondary | ICD-10-CM | POA: Diagnosis not present

## 2019-09-30 DIAGNOSIS — N184 Chronic kidney disease, stage 4 (severe): Secondary | ICD-10-CM

## 2019-09-30 DIAGNOSIS — N189 Chronic kidney disease, unspecified: Secondary | ICD-10-CM | POA: Diagnosis not present

## 2019-09-30 DIAGNOSIS — D72829 Elevated white blood cell count, unspecified: Secondary | ICD-10-CM | POA: Insufficient documentation

## 2019-09-30 DIAGNOSIS — Z8611 Personal history of tuberculosis: Secondary | ICD-10-CM

## 2019-09-30 DIAGNOSIS — K3189 Other diseases of stomach and duodenum: Secondary | ICD-10-CM | POA: Diagnosis not present

## 2019-09-30 HISTORY — DX: Anemia, unspecified: D64.9

## 2019-09-30 LAB — BLOOD GAS, VENOUS
Acid-base deficit: 12.9 mmol/L — ABNORMAL HIGH (ref 0.0–2.0)
Bicarbonate: 13.6 mmol/L — ABNORMAL LOW (ref 20.0–28.0)
FIO2: 21
O2 Saturation: 23.1 %
Patient temperature: 37
pCO2, Ven: 36.4 mmHg — ABNORMAL LOW (ref 44.0–60.0)
pH, Ven: 7.198 — CL (ref 7.250–7.430)
pO2, Ven: <31 mmHg — CL (ref 32.0–45.0)

## 2019-09-30 LAB — CBC WITH DIFFERENTIAL/PLATELET
Abs Immature Granulocytes: 0.08 10*3/uL — ABNORMAL HIGH (ref 0.00–0.07)
Basophils Absolute: 0 10*3/uL (ref 0.0–0.1)
Basophils Relative: 0 %
Eosinophils Absolute: 0 10*3/uL (ref 0.0–0.5)
Eosinophils Relative: 0 %
HCT: 14.1 % — ABNORMAL LOW (ref 36.0–46.0)
Hemoglobin: 3.9 g/dL — CL (ref 12.0–15.0)
Immature Granulocytes: 1 %
Lymphocytes Relative: 25 %
Lymphs Abs: 3.2 10*3/uL (ref 0.7–4.0)
MCH: 23.1 pg — ABNORMAL LOW (ref 26.0–34.0)
MCHC: 27.7 g/dL — ABNORMAL LOW (ref 30.0–36.0)
MCV: 83.4 fL (ref 80.0–100.0)
Monocytes Absolute: 0.8 10*3/uL (ref 0.1–1.0)
Monocytes Relative: 6 %
Neutro Abs: 9 10*3/uL — ABNORMAL HIGH (ref 1.7–7.7)
Neutrophils Relative %: 68 %
Platelets: 322 10*3/uL (ref 150–400)
RBC: 1.69 MIL/uL — ABNORMAL LOW (ref 3.87–5.11)
RDW: 20.6 % — ABNORMAL HIGH (ref 11.5–15.5)
WBC: 13.1 10*3/uL — ABNORMAL HIGH (ref 4.0–10.5)
nRBC: 2.2 % — ABNORMAL HIGH (ref 0.0–0.2)

## 2019-09-30 LAB — CBC
HCT: 14.8 % — ABNORMAL LOW (ref 36.0–46.0)
Hemoglobin: 4.2 g/dL — CL (ref 12.0–15.0)
MCH: 22.8 pg — ABNORMAL LOW (ref 26.0–34.0)
MCHC: 28.4 g/dL — ABNORMAL LOW (ref 30.0–36.0)
MCV: 80.4 fL (ref 80.0–100.0)
Platelets: 332 10*3/uL (ref 150–400)
RBC: 1.84 MIL/uL — ABNORMAL LOW (ref 3.87–5.11)
RDW: 20.7 % — ABNORMAL HIGH (ref 11.5–15.5)
WBC: 12.7 10*3/uL — ABNORMAL HIGH (ref 4.0–10.5)
nRBC: 2.1 % — ABNORMAL HIGH (ref 0.0–0.2)

## 2019-09-30 LAB — RETICULOCYTES
Immature Retic Fract: 44.8 % — ABNORMAL HIGH (ref 2.3–15.9)
RBC.: 1.8 MIL/uL — ABNORMAL LOW (ref 3.87–5.11)
Retic Count, Absolute: 59.4 10*3/uL (ref 19.0–186.0)
Retic Ct Pct: 3.3 % — ABNORMAL HIGH (ref 0.4–3.1)

## 2019-09-30 LAB — COMPREHENSIVE METABOLIC PANEL
ALT: 20 U/L (ref 0–44)
AST: 39 U/L (ref 15–41)
Albumin: 3.6 g/dL (ref 3.5–5.0)
Alkaline Phosphatase: 92 U/L (ref 38–126)
Anion gap: 18 — ABNORMAL HIGH (ref 5–15)
BUN: 30 mg/dL — ABNORMAL HIGH (ref 8–23)
CO2: 14 mmol/L — ABNORMAL LOW (ref 22–32)
Calcium: 9.2 mg/dL (ref 8.9–10.3)
Chloride: 109 mmol/L (ref 98–111)
Creatinine, Ser: 2.22 mg/dL — ABNORMAL HIGH (ref 0.44–1.00)
GFR calc Af Amer: 24 mL/min — ABNORMAL LOW (ref >60)
GFR calc non Af Amer: 21 mL/min — ABNORMAL LOW (ref >60)
Glucose, Bld: 175 mg/dL — ABNORMAL HIGH (ref 70–99)
Potassium: 3.7 mmol/L (ref 3.5–5.1)
Sodium: 141 mmol/L (ref 135–145)
Total Bilirubin: 0.5 mg/dL (ref 0.3–1.2)
Total Protein: 7.2 g/dL (ref 6.5–8.1)

## 2019-09-30 LAB — URINALYSIS, ROUTINE W REFLEX MICROSCOPIC
Bilirubin Urine: NEGATIVE
Glucose, UA: NEGATIVE mg/dL
Hgb urine dipstick: NEGATIVE
Ketones, ur: NEGATIVE mg/dL
Leukocytes,Ua: NEGATIVE
Nitrite: NEGATIVE
Protein, ur: NEGATIVE mg/dL
Specific Gravity, Urine: 1.014 (ref 1.005–1.030)
pH: 5 (ref 5.0–8.0)

## 2019-09-30 LAB — FOLATE: Folate: 13.8 ng/mL (ref >5.9)

## 2019-09-30 LAB — FERRITIN: Ferritin: 8 ng/mL — ABNORMAL LOW (ref 11–307)

## 2019-09-30 LAB — VITAMIN B12: Vitamin B-12: 1468 pg/mL — ABNORMAL HIGH (ref 180–914)

## 2019-09-30 LAB — LACTATE DEHYDROGENASE: LDH: 201 U/L — ABNORMAL HIGH (ref 98–192)

## 2019-09-30 LAB — LACTIC ACID, PLASMA
Lactic Acid, Venous: 1.2 mmol/L (ref 0.5–1.9)
Lactic Acid, Venous: 0.7 mmol/L (ref 0.5–1.9)

## 2019-09-30 LAB — PROTEIN / CREATININE RATIO, URINE
Creatinine, Urine: 116.83 mg/dL
Protein Creatinine Ratio: 0.09 mg/mg{Cre} (ref 0.00–0.15)
Total Protein, Urine: 10 mg/dL

## 2019-09-30 LAB — ABO/RH: ABO/RH(D): B POS

## 2019-09-30 LAB — SAVE SMEAR(SSMR), FOR PROVIDER SLIDE REVIEW

## 2019-09-30 LAB — TSH: TSH: 0.17 u[IU]/mL — ABNORMAL LOW (ref 0.350–4.500)

## 2019-09-30 LAB — PREPARE RBC (CROSSMATCH)

## 2019-09-30 MED ORDER — ACETAMINOPHEN 325 MG PO TABS: 650.0000 mg | ORAL_TABLET | Freq: Four times a day (QID) | ORAL | Status: DC | PRN

## 2019-09-30 MED ORDER — LEVETIRACETAM 500 MG PO TABS
500.0000 mg | ORAL_TABLET | Freq: Two times a day (BID) | ORAL | Status: DC
  Administered 2019-09-30 – 2019-10-02 (×4): 500 mg via ORAL
  Filled 2019-09-30 (×4): qty 1

## 2019-09-30 MED ORDER — ACETAMINOPHEN 650 MG RE SUPP: 650.0000 mg | Freq: Four times a day (QID) | RECTAL | Status: DC | PRN

## 2019-09-30 MED ORDER — SODIUM CHLORIDE 0.9% IV SOLUTION: Freq: Once | INTRAVENOUS | Status: DC

## 2019-09-30 MED ORDER — ATORVASTATIN CALCIUM 40 MG PO TABS
40.0000 mg | ORAL_TABLET | Freq: Every day | ORAL | Status: DC
  Administered 2019-09-30 – 2019-10-01 (×2): 40 mg via ORAL
  Filled 2019-09-30 (×2): qty 1

## 2019-09-30 NOTE — Assessment & Plan Note (Addendum)
  Anemia: Patient presents for evaluation of worsening kidney function after yesterday's routine labs.  She had no complaints of the anemia on her visit yesterday  and is very stoic on my exam. On interview, patient describes one month of feeling more tired than normal at the end of the day.  Her son has noticed she becomes more short of breath with walking recently.  Patient denies any melena, hematochezia, changes in bowel movements,  abdominal pain, or vaginal bleeding.  Patient denies any lightheadedness or dizziness . On exam patient has pale conjunctiva and pale oral mucosa.    - Metabolic acidosis on labs, bicarb 15 on yesterday's BMP, and venous blood gas 7.198 today.  Tachycardic on presentation.  Patient not orthostatic on exam.  EKG today consistent with EKG in 2016.  Left atrial enlargement and left ventricular hypertrophy.  Nonspecific ST changes.  No Colonoscopy on file, patient reports she has never wanted to have it done  - CBC confirmed critical anemia, Hgb 3.9. Ferritin 8, total billi is .5, LDH 201. Hgb 10 , 4 months ago. Her labs represent iron deficiency anemia, likely from blood loss. Given patient's age , will need to rule out possible malignancy.  Plan: - Patient typed and crossed . Blood bank has been called and 3 units are prepared -Taking patient to ED for admission

## 2019-09-30 NOTE — H&P (Addendum)
NAME:  Caitlin Cervantes, MRN:  [id number], DOB:  04/17/1941, LOS: 0 ADMISSION DATE:  09/30/2019, Primary: Axel Filler, MD  CHIEF COMPLAINT:  fatigue   Medical Service: Internal Medicine Teaching Service         Attending Physician: Dr. Lucious Groves, DO    First Contact: Dr. Darrick Meigs Pager: [phone]  Second Contact: Dr. Sharon Seller Pager: [phone]       After Hours (After 5p/  First Contact Pager: [phone]  weekends / holidays): Second Contact Pager: [phone]    History of present illness   Caitlin Cervantes is a 79 yo female with PMH of essential hypertension, CKD, and seizures who presented to the [facility] on 09/30/19 for 51mo history of progressive fatigue and follow up on her CKD which has been declining recently. Labs obtained during the clinic visit revealed a hemoglobin of 3.9.  Aside from feeling more fatigued over the past 2 months, she denies any other symptoms. She denies a history of melena, hematochezia, hematemesis, hematuria or easy bruising. She denies vaginal bleeding. No change in appetite, abdominal pain, weight loss, dysuria or other change in bowel/bladder function. She denies dizziness, numbness or tingling, recent headaches, shortness of breath, fever, chills, or night sweats.  No recent traumas including falls or MVA. Denies family history of bleeding or thrombosis. She has never received a colonoscopy or mammogram as she has not felt it necessary in the past.   Past Medical History  She,  has a past medical history of Anemia (10/30/1854), Diastolic dysfunction (10/04/9700), Essential hypertension (05/22/2015), History of active tuberculosis (07/25/1947), Hyperlipidemia (05/23/2015), Idiopathic scoliosis of thoracolumbar spine (12/08/2016), Osteoarthritis of right lower extremity (12/08/2016), Overweight (BMI 25.0-29.9) (12/08/2016), Seasonal allergic rhinitis due to pollen (12/08/2016), Seizures ([place]), and Stage III chronic kidney disease (05/22/2018).    Home Medications     Prior to Admission medications   Medication Sig Start Date End Date Taking? Authorizing Provider  amLODipine (NORVASC) 2.5 MG tablet TAKE ONE TABLET BY MOUTH DAILY 08/12/19   Axel Filler, MD  aspirin EC 81 MG tablet Take 81 mg by mouth at bedtime.    [provider]  atorvastatin (LIPITOR) 40 MG tablet TAKE 1 TABLET (40 MG TOTAL) BY MOUTH DAILY AT 6 PM. 08/12/19   Axel Filler, MD  hydrochlorothiazide (HYDRODIURIL) 25 MG tablet TAKE ONE TABLET BY MOUTH DAILY 08/12/19   Axel Filler, MD  levETIRAcetam (KEPPRA) 500 MG tablet TAKE ONE TABLET BY MOUTH TWICE A DAY 08/12/19   Axel Filler, MD  lisinopril (ZESTRIL) 40 MG tablet TAKE ONE TABLET BY MOUTH DAILY 08/12/19   Axel Filler, MD    Allergies    Allergies as of 09/30/2019  . (No Known Allergies)    Social History   reports that she has never smoked. She has never used smokeless tobacco. She reports that she does not drink alcohol or use drugs.   Family History   Her family history includes Healthy in her son; Hernia in her sister; Hypertension in her sister; Lactose intolerance in her sister; Leukemia (age of onset: 46) in her mother; Obstructive Sleep Apnea in her sister; Osteoarthritis in her sister. There is no history of Seizures.  No family history of cancer that she knows of   ROS  Review of Systems  Constitutional: Positive for malaise/fatigue. Negative for chills, diaphoresis, fever and weight loss.  HENT: Negative.   Respiratory: Negative for cough and shortness of breath.   Cardiovascular: Negative for  orthopnea and leg swelling.  Gastrointestinal: Negative for abdominal pain, blood in stool, constipation, diarrhea, melena, nausea and vomiting.  Genitourinary: Negative.   Musculoskeletal: Negative.   Skin: Negative for rash.  Neurological: Positive for seizures. Negative for dizziness, weakness and headaches.  Endo/Heme/Allergies: Does not  bruise/bleed easily.  Psychiatric/Behavioral: Negative.     Objective   Blood pressure 123/64, pulse 89, temperature 98 F (36.7 C), temperature source Oral, resp. rate (!) 23, height 4\' 11"  (1.499 m), weight 51.7 kg, SpO2 100 %.    Filed Weights   09/30/19 1347  Weight: 51.7 kg    Examination: GENERAL: in no acute distress, appears younger than stated age 54: pale conjunctiva. No scleral icterus. No appreciable cervical adenopathy. No palpable thyroid nodules. Unequal clavicular heights. Edema vs fat pad over the superior sternum and sternoclavicular notch CARDIAC: heart RRR. No peripheral edema.  PULMONARY: breathing comfortably on room air. Lung sounds clear to auscultation. ABDOMEN: soft. Nontender to palpation.  Nondistended. Bowel sounds active. Organomegaly not appreciated. No bruit. NEURO: a/o to person, place and month but not to year. CN II-XII grossly intact. Right resting hand tremor SKIN: no rash or lesions on limited exam  MSK: Atrophy of thenar eminence of right hand. Deviation of MP joints. No muscle weakness appreciated.   Significant Diagnostic Tests:  EKG: SR. LVH.   Labs    CBC Latest Ref Rng & Units 09/30/2019 09/30/2019 05/05/2019  WBC 4.0 - 10.5 K/uL 12.7(H) 13.1(H) 7.8  Hemoglobin 12.0 - 15.0 g/dL 4.2(LL) 3.9(LL) 10.0(L)  Hematocrit 36.0 - 46.0 % 14.8(L) 14.1(L) 29.1(L)  Platelets 150 - 400 K/uL 332 322 394   BMP Latest Ref Rng & Units 09/30/2019 09/29/2019 05/05/2019  Glucose 70 - 99 mg/dL 175(H) 136(H) 123(H)  BUN 8 - 23 mg/dL 30(H) 27(H) 27  Creatinine 0.44 - 1.00 mg/dL 2.22(H) 2.40(H) 1.86(H)  BUN/Creat Ratio 12 - 28 - - 15  Sodium 135 - 145 mmol/L 141 142 142  Potassium 3.5 - 5.1 mmol/L 3.7 3.9 4.7  Chloride 98 - 111 mmol/L 109 110 105  CO2 22 - 32 mmol/L 14(L) 15(L) 19(L)  Calcium 8.9 - 10.3 mg/dL 9.2 9.3 9.6   Ferritin 8.  Summary  79 yo female with PMH of essential hypertension and CKD IV who was admitted to the internal medicine teaching  service for iron deficient anemia.  Assessment & Plan:  Principal Problem:   Severe anemia Active Problems:   Increased anion gap metabolic acidosis   Leukocytosis   Iron deficiency anemia  Symptomatic iron deficiency anemia.  Only symptom is fatigue which suggests anemia being chronic. BUN not significantly elevated to suggest large GI bleed. No prior screening colonoscopy or mammogram. LDH mildly elevated with normal bilirubin.  High suspicion for malignancy and will need thorough investigation. Weight has dropped about 14lbs over the last five months. She is open to colonoscopy for evaluation as she has not had one in the past. Will also need to discuss if she would consider treatment if etiology was found on scope.  Plan Repeat CBC to ensure accuracy Transfuse PRBC with post transfusion h/h Save smear w/pathologist review Discuss with GI  NPO at midnight  F/u labs obtained in ED - folate, b12, tsh  AKI on CKD III Anion Gap Metabolic Acidosis  Per chart review, appears to have been deteriorating over the past couple of years. Believed to be attributable to her hypertension and prediabetes. Somewhat difficult to determine if her decrease in renal function since 68mo prior  is acute or progression of CKD. Bedside US in clinic without acute findings of hydronephrosis and no changes in urination. IFE done last October with only very mild elevation in Kappa/lambda ratio and IgA.  Plan: will hold ACE and hctz. UA for evaluation of protein, blood, etc.  Avoid nephrotoxic agents.  Essential hypertension. Amlodipine 2.5mg , hctz 25mg , lisinopril 40mg .  Blood pressures stable. Will hold lisinopril and hctz for acute renal injury. Plan: holding HCTZ & lisinopril with CKD progression. Hold amlodipine overnight. Cont. Statin.   Leukocytosis Neutrophil predominant. No signs or symptoms of infection. Likely in the setting of current inflammation.   History of seizures in 2016 believed to be  attributable to stroke. No further seizures since 2016 however has remained on antiepileptics.  Plan: continue keppra 500mg  bid for now. Will recheck BMP in the am and may need to decrease to qd.    Best practice:  CODE STATUS: FULL Diet: npo GI prophylaxis: PPI DVT for prophylaxis: SCD Dispo: Admit patient to Inpatient with expected length of stay greater than 2 midnights.   Mitzi Hansen, MD INTERNAL MEDICINE RESIDENT PGY-1 Pager # [phone] 09/30/19  6:19 PM

## 2019-09-30 NOTE — Addendum Note (Signed)
Addended by: Lalla Brothers T on: 09/30/2019 08:51 AM   Modules accepted: Orders

## 2019-09-30 NOTE — ED Provider Notes (Signed)
[facility] EMERGENCY DEPARTMENT Provider Note   CSN: [id number] Arrival date & time: 09/30/19  1337     History Chief Complaint  Patient presents with  . Weakness    Caitlin Cervantes is a 79 y.o. female with PMH of HTN, HLD, h/o seizures, HFpEF, prediabetes, and CKD stage III, here from the internal medicine office where she was found to have a critical hemoglobin of 3.9. She reports she has been feeling tired for about 1 month.  She denies any chest pain, tachycardia and denies any current shortness of breath but says she has had some SOB on exertion. She denies any bleeding that she is aware of- no black stools, no blood in her stools. She reports normal bowel movements. She reports no trouble urinating, no increased frequency, no dysuria. She denies any lightheadedness, dizziness, headaches. She reports she has never had a colonoscopy.  Patient denies any fevers, chills, night sweats, unintentional weight loss.   Past Medical History:  Diagnosis Date  . Anemia 09/30/2019  . Diastolic dysfunction 08/11/4172   Grade I  . Essential hypertension 05/22/2015  . History of active tuberculosis 07/25/1947   s/p left lower lobectomy at age 61 and two years in a local [facility]  . Hyperlipidemia 05/23/2015  . Idiopathic scoliosis of thoracolumbar spine 12/08/2016  . Osteoarthritis of right lower extremity 12/08/2016  . Overweight (BMI 25.0-29.9) 12/08/2016  . Seasonal allergic rhinitis due to pollen 12/08/2016  . Seizures ([place])    Possible seizure disorder marked by stareing events.  . Stage III chronic kidney disease 05/22/2018   Patient Active Problem List   Diagnosis Date Noted  . Acute kidney failure ([place]) 09/30/2019  . Anemia 09/30/2019  . Ischemic vascular disease 05/05/2019  . Prediabetes 05/05/2019  . Stage III chronic kidney disease 05/22/2018  . Diastolic dysfunction 03/06/4817  . Osteoarthritis of right lower extremity 12/08/2016  . Overweight (BMI  25.0-29.9) 12/08/2016  . Healthcare maintenance 09/17/2015  . Seizures ([place])   . Hyperlipidemia 05/23/2015  . Essential hypertension 05/22/2015   Past Surgical History:  Procedure Laterality Date  . LUNG LOBECTOMY Left    TB  . TONSILLECTOMY    . TUBAL LIGATION Bilateral     OB History   No obstetric history on file.    Family History  Problem Relation Age of Onset  . Leukemia Mother 23  . Hypertension Sister   . Obstructive Sleep Apnea Sister   . Osteoarthritis Sister   . Hernia Sister        Umbilical  . Lactose intolerance Sister   . Healthy Son   . Seizures Neg Hx    Social History   Tobacco Use  . Smoking status: Never Smoker  . Smokeless tobacco: Never Used  Substance Use Topics  . Alcohol use: No    Alcohol/week: 0.0 standard drinks  . Drug use: No   Home Medications Prior to Admission medications   Medication Sig Start Date End Date Taking? Authorizing Provider  amLODipine (NORVASC) 2.5 MG tablet TAKE ONE TABLET BY MOUTH DAILY 08/12/19   Axel Filler, MD  aspirin EC 81 MG tablet Take 81 mg by mouth at bedtime.    [provider]  atorvastatin (LIPITOR) 40 MG tablet TAKE 1 TABLET (40 MG TOTAL) BY MOUTH DAILY AT 6 PM. 08/12/19   Axel Filler, MD  hydrochlorothiazide (HYDRODIURIL) 25 MG tablet TAKE ONE TABLET BY MOUTH DAILY 08/12/19   Axel Filler, MD  levETIRAcetam [facility])  500 MG tablet TAKE ONE TABLET BY MOUTH TWICE A DAY 08/12/19   Axel Filler, MD  lisinopril (ZESTRIL) 40 MG tablet TAKE ONE TABLET BY MOUTH DAILY 08/12/19   Axel Filler, MD   Allergies    Patient has no known allergies.  Review of Systems   Review of Systems  Constitutional: Positive for fatigue. Negative for appetite change, chills, diaphoresis, fever and unexpected weight change.  Eyes: Negative for visual disturbance.  Respiratory: Negative for chest tightness and shortness of breath.   Cardiovascular: Negative for chest pain  and palpitations.  Gastrointestinal: Negative for abdominal pain, anal bleeding, blood in stool, constipation, diarrhea, nausea and vomiting.  Genitourinary: Negative for difficulty urinating, dysuria, flank pain and hematuria.  Neurological: Negative for dizziness, light-headedness and headaches.    Physical Exam Updated Vital Signs BP 128/60   Pulse 99   Temp 97.8 F (36.6 C) (Oral)   Resp 20   Ht 4\' 11"  (1.499 m)   Wt 51.7 kg   LMP  (LMP Unknown)   SpO2 100%   BMI 23.03 kg/m   Physical Exam Vitals and nursing note reviewed.  Constitutional:      General: She is not in acute distress.    Appearance: Normal appearance. She is normal weight.  HENT:     Head: Normocephalic.     Nose: Nose normal.  Eyes:     Pupils: Pupils are equal, round, and reactive to light.     Comments: Pale conjunctiva  Cardiovascular:     Rate and Rhythm: Regular rhythm. Tachycardia present.     Heart sounds: Murmur present.  Pulmonary:     Effort: Pulmonary effort is normal. No respiratory distress.  Abdominal:     General: Abdomen is flat. Bowel sounds are normal. There is no distension.     Palpations: Abdomen is soft.     Tenderness: There is no abdominal tenderness.  Musculoskeletal:        General: Normal range of motion.     Cervical back: Normal range of motion and neck supple.  Skin:    General: Skin is warm.     Capillary Refill: Capillary refill takes more than 3 seconds.  Neurological:     General: No focal deficit present.     Mental Status: She is alert and oriented to person, place, and time.    ED Results / Procedures / Treatments   Labs (all labs ordered are listed, but only abnormal results are displayed) Labs Reviewed  CBC  FOLATE  VITAMIN B12  TSH  RETICULOCYTES  SAVE SMEAR (SSMR)    EKG None  Radiology No results found.  Procedures Procedures (including critical care time)  Medications Ordered in ED Medications  0.9 %  sodium chloride infusion  (Manually program via Guardrails IV Fluids) (has no administration in time range)    ED Course  I have reviewed the triage vital signs and the nursing notes.  Pertinent labs & imaging results that were available during my care of the patient were reviewed by me and considered in my medical decision making (see chart for details).   MDM Rules/Calculators/A&P                     79 yo female sent here by internal medicine for blood transfusion following critical result of hemoglobin of 3.9 in their office. Type and screen performed this a.m. and 3U pRBC's ordered prior to arrival. No clear signs of active bleed. Patient no  endorsing any symptoms of GI bleed, no signs of infection and denies any B symptoms. Reports never having colonoscopy.  IV access currently being obtained and will start transfusion. Hgb wnl 04/2019 and findings not c/w anemia of chronic disease, more c/w iron deficiency anemia. Will repeat CBC here and also obtain folate, B12, TSH, reticulocyte count and will save smear prior to patient receiving transfusions to rule out other causes of normocytic anemia given patient reporting no obvious signs of blood loss. Patient currently with stable vitals, will call for admission to internal medicine team. Patient signed out to admitting team at 3:54pm.  Caitlin Cervantes was evaluated in Emergency Department on 09/30/2019 for the symptoms described in the history of present illness. She was evaluated in the context of the global COVID-19 pandemic, which necessitated consideration that the patient might be at risk for infection with the SARS-CoV-2 virus that causes COVID-19. Institutional protocols and algorithms that pertain to the evaluation of patients at risk for COVID-19 are in a state of rapid change based on information released by regulatory bodies including the CDC and federal and state organizations. These policies and algorithms were followed during the patient's care in the ED. Final  Clinical Impression(s) / ED Diagnoses Final diagnoses:  Anemia due to stage 4 chronic kidney disease (HCC)  Anemia, unspecified type    Rx / DC Orders ED Discharge Orders    None       Caitlin Cervantes, [place], DO 09/30/19 [place], Wenda Over[place], MD 10/01/19 [phone]

## 2019-09-30 NOTE — Progress Notes (Signed)
Patient transported via wheelchair to Emergency Department.  Remains alert and oriented.  Son in attendance.  Sander Nephew, RN  09/30/2019 12:03 PM.

## 2019-09-30 NOTE — Telephone Encounter (Signed)
Great! Please send patient straight back for labs when she arrives. There are future orders that are pended. Then she can be evaluated in the [facility].

## 2019-09-30 NOTE — ED Notes (Signed)
Got patient undress on the monitor into a gown patient is resting with call bell in reach and nurse is at bedside

## 2019-09-30 NOTE — Progress Notes (Signed)
   CC: Elevated creatinine  HPI:Caitlin Cervantes is a 79 y.o. female who presents for evaluation of elevated creatinine. Please see individual problem based A/P for details.  Past Medical History:  Diagnosis Date  . Diastolic dysfunction 2/35/5732   Grade I  . Essential hypertension 05/22/2015  . History of active tuberculosis 07/25/1947   s/p left lower lobectomy at age 41 and two years in a local [place] [facility]  . Hyperlipidemia 05/23/2015  . Idiopathic scoliosis of thoracolumbar spine 12/08/2016  . Osteoarthritis of right lower extremity 12/08/2016  . Overweight (BMI 25.0-29.9) 12/08/2016  . Seasonal allergic rhinitis due to pollen 12/08/2016  . Seizures ([place])    Possible seizure disorder marked by stareing events.  . Stage III chronic kidney disease 05/22/2018   Review of Systems:  ROS negative except as per HPI.  Physical Exam: Vitals:   09/30/19 1005  BP: (!) 115/46  Pulse: (!) 109  Temp: 97.8 F (36.6 C)  TempSrc: Oral  Weight: 115 lb (52.2 kg)  Height: 5\' 1"  (1.549 m)    General: Alert, nl appearance HE: Normocephalic, atraumatic , EOMI, pale Conjunctivae , pale oral mucosa ENT: No congestion, no rhinorrhea moist, no exudate or erythema  Cardiovascular: Tachycardic regular rhythm.  No murmurs, rubs, or gallops Pulmonary : Effort normal, breath sounds normal. No wheezes, rales, or rhonchi Abdominal: soft, nontender,  bowel sounds present Musculoskeletal: no swelling , deformity, injury ,or tenderness in extremities, Skin: Warm, dry , no bruising, erythema, or rash Neuro: No focal deficits, sensory intact. Repeats herself on exam, short term memory intact with suggestions ( able to recall apple table penny with hints at 3 minutes) Alert.  Psychiatric/Behavioral:  normal mood, normal behavior   Assessment & Plan:   See Encounters Tab for problem based charting.  Patient seen with Dr. Evette Doffing

## 2019-09-30 NOTE — Assessment & Plan Note (Signed)
AKI on CKD stage 3a: Patient's Cr normal in 2016 and has slowly been trending up. Thought to be related to longstanding hypertension and prediabetes. Recent bump in creatinine 4 months ago in setting of daily NSAID use.  PCP recommended patient stop using Aleve and rechecked BMP on recent visit.  Creatinine elevated to 2.4 from 1.8.  I believe this could be prerenal in setting of anemia, hemoglobin 3.9.  Although we have not found a source for blood loss yet.  Patient will be admitted to the hospital for critical anemia Patient will need a rectal exam and I will discuss this with the admitting team to ensure it is done.

## 2019-09-30 NOTE — ED Triage Notes (Signed)
Pt sent here from internal medicine clinic for evaluation of critical hgb 3.9. Pt c.o feeling weak and sob for a while now. Pt a.o, nad noted at this time.

## 2019-09-30 NOTE — ED Notes (Signed)
ED TO INPATIENT HANDOFF REPORT  ED Nurse Name and Phone #: [phone]  S Name/Age/Gender Caitlin Cervantes 79 y.o. female Room/Bed: 043C/043C  Code Status   Code Status: Full Code  Home/SNF/Other Home Patient oriented to: self, place, time and situation Is this baseline? Yes   Triage Complete: Triage complete  Chief Complaint Acute on chronic anemia [D64.9]  Triage Note Pt sent here from internal medicine clinic for evaluation of critical hgb 3.9. Pt c.o feeling weak and sob for a while now. Pt a.o, nad noted at this time.     Allergies No Known Allergies  Level of Care/Admitting Diagnosis ED Disposition    ED Disposition Condition [facility] Area: [place] [[id number]]  Level of Care: Telemetry Medical [104]  May admit patient to [facility] or [facility] if equivalent level of care is available:: No  Covid Evaluation: Asymptomatic Screening Protocol (No Symptoms)  Diagnosis: Acute on chronic anemia [1601093]  Admitting Physician: Bosie Helper  Attending Physician: Lucious Groves [[id number]]  Estimated length of stay: past midnight tomorrow  Certification:: I certify this patient will need inpatient services for at least 2 midnights       B Medical/Surgery History Past Medical History:  Diagnosis Date  . Anemia 09/30/2019  . Diastolic dysfunction 2/35/5732   Grade I  . Essential hypertension 05/22/2015  . History of active tuberculosis 07/25/1947   s/p left lower lobectomy at age 10 and two years in a local [facility]  . Hyperlipidemia 05/23/2015  . Idiopathic scoliosis of thoracolumbar spine 12/08/2016  . Osteoarthritis of right lower extremity 12/08/2016  . Overweight (BMI 25.0-29.9) 12/08/2016  . Seasonal allergic rhinitis due to pollen 12/08/2016  . Seizures ([place])    Possible seizure disorder marked by stareing events.  . Stage III chronic kidney disease 05/22/2018   Past Surgical History:  Procedure  Laterality Date  . LUNG LOBECTOMY Left    TB  . TONSILLECTOMY    . TUBAL LIGATION Bilateral      A IV Location/Drains/Wounds Patient Lines/Drains/Airways Status   Active Line/Drains/Airways    Name:   Placement date:   Placement time:   Site:   Days:   Peripheral IV 09/30/19 Left Forearm   09/30/19    1400    Forearm   less than 1          Intake/Output Last 24 hours  Intake/Output Summary (Last 24 hours) at 09/30/2019 1900 Last data filed at 09/30/2019 1725 Gross per 24 hour  Intake 315 ml  Output --  Net 315 ml    Labs/Imaging Results for orders placed or performed during the hospital encounter of 09/30/19 (from the past 48 hour(s))  CBC     Status: Abnormal   Collection Time: 09/30/19  3:33 PM  Result Value Ref Range   WBC 12.7 (H) 4.0 - 10.5 K/uL   RBC 1.84 (L) 3.87 - 5.11 MIL/uL   Hemoglobin 4.2 (LL) 12.0 - 15.0 g/dL    Comment: REPEATED TO VERIFY CRITICAL VALUE NOTED.  VALUE IS CONSISTENT WITH PREVIOUSLY REPORTED AND CALLED VALUE.    HCT 14.8 (L) 36.0 - 46.0 %   MCV 80.4 80.0 - 100.0 fL   MCH 22.8 (L) 26.0 - 34.0 pg   MCHC 28.4 (L) 30.0 - 36.0 g/dL   RDW 20.7 (H) 11.5 - 15.5 %   Platelets 332 150 - 400 K/uL   nRBC 2.1 (H) 0.0 - 0.2 %  Comment: Performed at [facility], [place] [address] [place], [place] [address]  Folate     Status: None   Collection Time: 09/30/19  3:33 PM  Result Value Ref Range   Folate 13.8 >5.9 ng/mL    Comment: Performed at [facility], [place] [address] [place], [place] [address]  Vitamin B12     Status: Abnormal   Collection Time: 09/30/19  3:33 PM  Result Value Ref Range   Vitamin B-12 1,468 (H) 180 - 914 pg/mL    Comment: (NOTE) This assay is not validated for testing neonatal or myeloproliferative syndrome specimens for Vitamin B12 levels. Performed at [facility], [place] [address] [place], [place] [address]   TSH     Status: Abnormal   Collection Time: 09/30/19  3:33 PM  Result Value Ref Range   TSH  0.170 (L) 0.350 - 4.500 uIU/mL    Comment: Performed by a 3rd Generation assay with a functional sensitivity of <=0.01 uIU/mL. Performed at [facility], [place] [address] [place], [place] [address]   Reticulocytes     Status: Abnormal   Collection Time: 09/30/19  3:33 PM  Result Value Ref Range   Retic Ct Pct 3.3 (H) 0.4 - 3.1 %   RBC. 1.80 (L) 3.87 - 5.11 MIL/uL   Retic Count, Absolute 59.4 19.0 - 186.0 K/uL   Immature Retic Fract 44.8 (H) 2.3 - 15.9 %    Comment: Performed at [facility] [address] [place], [place] [address]  Save Smear     Status: None   Collection Time: 09/30/19  3:33 PM  Result Value Ref Range   Smear Review SMEAR STAINED AND AVAILABLE FOR REVIEW     Comment: Performed at [facility], [place] [address], [place], [place] [address]  Urinalysis, Routine w reflex microscopic     Status: Abnormal   Collection Time: 09/30/19  5:22 PM  Result Value Ref Range   Color, Urine YELLOW YELLOW   APPearance HAZY (A) CLEAR   Specific Gravity, Urine 1.014 1.005 - 1.030   pH 5.0 5.0 - 8.0   Glucose, UA NEGATIVE NEGATIVE mg/dL   Hgb urine dipstick NEGATIVE NEGATIVE   Bilirubin Urine NEGATIVE NEGATIVE   Ketones, ur NEGATIVE NEGATIVE mg/dL   Protein, ur NEGATIVE NEGATIVE mg/dL   Nitrite NEGATIVE NEGATIVE   Leukocytes,Ua NEGATIVE NEGATIVE    Comment: Performed at [facility] [address], [place],  [address]  Protein / creatinine ratio, urine     Status: None   Collection Time: 09/30/19  5:22 PM  Result Value Ref Range   Creatinine, Urine 116.83 mg/dL   Total Protein, Urine 10 mg/dL    Comment: NO NORMAL RANGE ESTABLISHED FOR THIS TEST   Protein Creatinine Ratio 0.09 0.00 - 0.15 mg/mg[Cre]    Comment: Performed at [facility] [address], [place],  [address]   No results found.  Pending Labs Unresulted Labs (From admission, onward)    Start     Ordered   10/01/19 0500  Comprehensive metabolic panel  Tomorrow  morning,   R     09/30/19 1606   10/01/19 0500  Magnesium  Tomorrow morning,   R     09/30/19 1606   09/30/19 1706  Lactic acid, plasma  STAT Now then every 3 hours,   R (with STAT occurrences)     09/30/19 1720   09/30/19 1607  Pathologist  smear review  Once,   STAT     09/30/19 1606          Vitals/Pain Today's Vitals   09/30/19 1725 09/30/19 1730 09/30/19 1800 09/30/19 1830  BP: 123/64 120/66 109/66 116/66  Pulse: 89   76  Resp: (!) 23 18 (!) 24 16  Temp: 98 F (36.7 C)     TempSrc: Oral     SpO2: 100%   100%  Weight:      Height:      PainSc:        Isolation Precautions No active isolations  Medications Medications  0.9 %  sodium chloride infusion (Manually program via Guardrails IV Fluids) ( Intravenous Hold 09/30/19 1713)  acetaminophen (TYLENOL) tablet 650 mg (has no administration in time range)    Or  acetaminophen (TYLENOL) suppository 650 mg (has no administration in time range)  atorvastatin (LIPITOR) tablet 40 mg (has no administration in time range)  levETIRAcetam (KEPPRA) tablet 500 mg (has no administration in time range)    Mobility walks with person assist       R Recommendations: See Admitting Provider Note  Report given to:   Additional Notes:

## 2019-09-30 NOTE — Telephone Encounter (Signed)
Call from pt's son, Purcell Nails - explained Dr Evette Doffing wants pt to come back to the office for labs/be eval again. He stated pt is feeling tired and repeating herself. And he took the day off so he will be able to bring her the am. ACC appt scheduled this am @ 0945 AM.

## 2019-09-30 NOTE — Telephone Encounter (Signed)
Thanks Bonnita Nasuti for your work. No answer again this morning. I did speak with Nanci Pina, her long time friend and next contact in the chart, who tells me that she spoke with the patient yesterday. Rosaria Ferries gave me more collateral information that Caitlin Cervantes has been more forgetful lately, and more fatigued. The patient is a very private person, so Rosaria Ferries was not surprised to hear that Ms. Breeze did not tell me about any of these issues.   Rosaria Ferries is going to try to talk with Ms. Roethler again. I gave instructions to have Ms. Briggs come back to the clinic this morning as soon as possible. We will add her into the [facility] schedule and get stat CBC, CMP, and VBG to explore the lab abnormalities from yesterday.

## 2019-09-30 NOTE — ED Notes (Signed)
Contact info: Alorah Mcree Son  [phone]

## 2019-09-30 NOTE — Progress Notes (Signed)
Report call attempted and unsuccessful. Will call back in a few minutes.

## 2019-09-30 NOTE — Progress Notes (Signed)
Report received from Tyler, RN

## 2019-10-01 DIAGNOSIS — D649 Anemia, unspecified: Secondary | ICD-10-CM

## 2019-10-01 LAB — COMPREHENSIVE METABOLIC PANEL
ALT: 21 U/L (ref 0–44)
AST: 38 U/L (ref 15–41)
Albumin: 3.6 g/dL (ref 3.5–5.0)
Alkaline Phosphatase: 95 U/L (ref 38–126)
Anion gap: 12 (ref 5–15)
BUN: 22 mg/dL (ref 8–23)
CO2: 19 mmol/L — ABNORMAL LOW (ref 22–32)
Calcium: 9.4 mg/dL (ref 8.9–10.3)
Chloride: 111 mmol/L (ref 98–111)
Creatinine, Ser: 1.4 mg/dL — ABNORMAL HIGH (ref 0.44–1.00)
GFR calc Af Amer: 42 mL/min — ABNORMAL LOW (ref >60)
GFR calc non Af Amer: 36 mL/min — ABNORMAL LOW (ref >60)
Glucose, Bld: 108 mg/dL — ABNORMAL HIGH (ref 70–99)
Potassium: 3.9 mmol/L (ref 3.5–5.1)
Sodium: 142 mmol/L (ref 135–145)
Total Bilirubin: 1.4 mg/dL — ABNORMAL HIGH (ref 0.3–1.2)
Total Protein: 7.7 g/dL (ref 6.5–8.1)

## 2019-10-01 LAB — HEMOGLOBIN AND HEMATOCRIT, BLOOD
HCT: 32.2 % — ABNORMAL LOW (ref 36.0–46.0)
Hemoglobin: 10.9 g/dL — ABNORMAL LOW (ref 12.0–15.0)

## 2019-10-01 LAB — APTT: aPTT: 27 seconds (ref 24–36)

## 2019-10-01 LAB — PROTIME-INR
INR: 1 (ref 0.8–1.2)
Prothrombin Time: 13.2 seconds (ref 11.4–15.2)

## 2019-10-01 LAB — PATHOLOGIST SMEAR REVIEW: Path Review: ELEVATED

## 2019-10-01 LAB — MAGNESIUM: Magnesium: 2 mg/dL (ref 1.7–2.4)

## 2019-10-01 LAB — FIBRINOGEN: Fibrinogen: 484 mg/dL — ABNORMAL HIGH (ref 210–475)

## 2019-10-01 LAB — SARS CORONAVIRUS 2 (TAT 6-24 HRS): SARS Coronavirus 2: NEGATIVE

## 2019-10-01 LAB — T4, FREE: Free T4: 0.81 ng/dL (ref 0.61–1.12)

## 2019-10-01 MED ORDER — PEG 3350-KCL-NA BICARB-NACL 420 G PO SOLR
4000.0000 mL | Freq: Once | ORAL | Status: AC
  Administered 2019-10-01 – 2019-10-02 (×2): 4000 mL via ORAL
  Filled 2019-10-01: qty 4000

## 2019-10-01 MED ORDER — SODIUM CHLORIDE 0.9 % IV SOLN
510.0000 mg | Freq: Once | INTRAVENOUS | Status: AC
  Administered 2019-10-01: 510 mg via INTRAVENOUS
  Filled 2019-10-01: qty 17

## 2019-10-01 MED ORDER — SODIUM CHLORIDE 0.9 % IV SOLN: INTRAVENOUS | Status: DC

## 2019-10-01 NOTE — Addendum Note (Signed)
Addended by: Lalla Brothers T on: 10/01/2019 09:00 AM   Modules accepted: Level of Service

## 2019-10-01 NOTE — Anesthesia Preprocedure Evaluation (Addendum)
Anesthesia Evaluation  Patient identified by MRN, date of birth, ID band Patient awake    Reviewed: Allergy & Precautions, NPO status , Patient's Chart, lab work & pertinent test results  History of Anesthesia Complications Negative for: history of anesthetic complications  Airway Mallampati: I  TM Distance: >3 FB Neck ROM: Full    Dental  (+) Edentulous Upper, Edentulous Lower, Dental Advisory Given   Pulmonary neg pulmonary ROS,    Pulmonary exam normal        Cardiovascular hypertension, Pt. on medications Normal cardiovascular exam     Neuro/Psych Seizures -,     GI/Hepatic negative GI ROS, Neg liver ROS,   Endo/Other  negative endocrine ROS  Renal/GU Renal disease     Musculoskeletal negative musculoskeletal ROS (+)   Abdominal   Peds  Hematology negative hematology ROS (+)   Anesthesia Other Findings Day of surgery medications reviewed with the patient.  Reproductive/Obstetrics                            Anesthesia Physical Anesthesia Plan  ASA: III  Anesthesia Plan: MAC   Post-op Pain Management:    Induction: Intravenous  PONV Risk Score and Plan: 2 and Ondansetron and Propofol infusion  Airway Management Planned: Natural Airway  Additional Equipment:   Intra-op Plan:   Post-operative Plan:   Informed Consent: I have reviewed the patients History and Physical, chart, labs and discussed the procedure including the risks, benefits and alternatives for the proposed anesthesia with the patient or authorized representative who has indicated his/her understanding and acceptance.     Dental advisory given  Plan Discussed with: Anesthesiologist and CRNA  Anesthesia Plan Comments:        Anesthesia Quick Evaluation

## 2019-10-01 NOTE — H&P (View-Only) (Signed)
[facility] Gastroenterology Consult  Referring Provider: Internal medicine teaching service Primary Care Physician:  Axel Filler, MD Primary Gastroenterologist: Althia Forts  Reason for Consultation: Severe symptomatic anemia  HPI: Caitlin Cervantes is a 79 y.o. female had her labs performed as an outpatient in the internal medicine clinic and was noted to have a hemoglobin of 3.9, subsequently sent to ER for admission, hemoglobin confirmed to be 4.2.  Patient states that for the past 1 month she has experienced exertional shortness of breath and some tiredness.  However she has not noted any obvious bleeding and denies noticing blood in stool, black stools, vomiting blood, bruising, bleeding from gums or nose or noticing blood in urine.  Normally she has a bowel movement daily, denies constipation or diarrhea.  She denies abdominal or rectal pain. She has a good appetite and denies unintentional weight loss. She denies nausea, vomiting, acid reflux, heartburn, difficulty swallowing, pain on swallowing, early satiety or bloating.  No prior colonoscopy. Denies family history of colon cancer. Since admission she has been given 3 units of PRBC transfusion.  She denies frequent use of NSAIDs, takes aspirin 81 mg on a daily basis, denies use of anticoagulation or any other antiplatelets.   Past Medical History:  Diagnosis Date  . Anemia 09/30/2019  . Diastolic dysfunction 2/37/6283   Grade I  . Essential hypertension 05/22/2015  . History of active tuberculosis 07/25/1947   s/p left lower lobectomy at age 5 and two years in a local [place] [facility]  . Hyperlipidemia 05/23/2015  . Idiopathic scoliosis of thoracolumbar spine 12/08/2016  . Osteoarthritis of right lower extremity 12/08/2016  . Overweight (BMI 25.0-29.9) 12/08/2016  . Seasonal allergic rhinitis due to pollen 12/08/2016  . Seizures ([place])    Possible seizure disorder marked by stareing events.  . Stage III chronic  kidney disease 05/22/2018    Past Surgical History:  Procedure Laterality Date  . LUNG LOBECTOMY Left    TB  . TONSILLECTOMY    . TUBAL LIGATION Bilateral     Prior to Admission medications   Medication Sig Start Date End Date Taking? Authorizing Provider  amLODipine (NORVASC) 2.5 MG tablet TAKE ONE TABLET BY MOUTH DAILY 08/12/19   Axel Filler, MD  aspirin EC 81 MG tablet Take 81 mg by mouth at bedtime.    [provider]  atorvastatin (LIPITOR) 40 MG tablet TAKE 1 TABLET (40 MG TOTAL) BY MOUTH DAILY AT 6 PM. 08/12/19   Axel Filler, MD  hydrochlorothiazide (HYDRODIURIL) 25 MG tablet TAKE ONE TABLET BY MOUTH DAILY 08/12/19   Axel Filler, MD  levETIRAcetam (KEPPRA) 500 MG tablet TAKE ONE TABLET BY MOUTH TWICE A DAY 08/12/19   Axel Filler, MD  lisinopril (ZESTRIL) 40 MG tablet TAKE ONE TABLET BY MOUTH DAILY 08/12/19   Axel Filler, MD    Current Facility-Administered Medications  Medication Dose Route Frequency Provider Last Rate Last Admin  . 0.9 %  sodium chloride infusion (Manually program via Guardrails IV Fluids)   Intravenous Once Seawell, Jaimie A, DO   Stopped at 09/30/19 1713  . acetaminophen (TYLENOL) tablet 650 mg  650 mg Oral Q6H PRN Seawell, Jaimie A, DO       Or  . acetaminophen (TYLENOL) suppository 650 mg  650 mg Rectal Q6H PRN Seawell, Jaimie A, DO      . atorvastatin (LIPITOR) tablet 40 mg  40 mg Oral q1800 Seawell, Jaimie A, DO   40 mg at 09/30/19 1934  .  levETIRAcetam (KEPPRA) tablet 500 mg  500 mg Oral BID Seawell, Jaimie A, DO   500 mg at 10/01/19 0700  . polyethylene glycol-electrolytes (NuLYTELY) solution 4,000 mL  4,000 mL Oral Once Ronnette Juniper, MD        Allergies as of 09/30/2019  . (No Known Allergies)    Family History  Problem Relation Age of Onset  . Leukemia Mother 80  . Hypertension Sister   . Obstructive Sleep Apnea Sister   . Osteoarthritis Sister   . Hernia Sister        Umbilical  .  Lactose intolerance Sister   . Healthy Son   . Seizures Neg Hx     Social History   Socioeconomic History  . Marital status: Single    Spouse name: Not on file  . Number of children: 1  . Years of education: HS  . Highest education level: Not on file  Occupational History  . Occupation: Retired    Comment: Worked in Scientist, research (medical) and office work  Tobacco Use  . Smoking status: Never Smoker  . Smokeless tobacco: Never Used  Substance and Sexual Activity  . Alcohol use: No    Alcohol/week: 0.0 standard drinks  . Drug use: No  . Sexual activity: Never    Birth control/protection: None  Other Topics Concern  . Not on file  Social History Narrative   Lives with her son in an apartment (2nd floor unit) in [place].  Never married.  No pets currently. She is right handed.    Social Determinants of Health   Financial Resource Strain:   . Difficulty of Paying Living Expenses: Not on file  Food Insecurity:   . Worried About Charity fundraiser in the Last Year: Not on file  . Ran Out of Food in the Last Year: Not on file  Transportation Needs:   . Lack of Transportation (Medical): Not on file  . Lack of Transportation (Non-Medical): Not on file  Physical Activity:   . Days of Exercise per Week: Not on file  . Minutes of Exercise per Session: Not on file  Stress:   . Feeling of Stress : Not on file  Social Connections:   . Frequency of Communication with Friends and Family: Not on file  . Frequency of Social Gatherings with Friends and Family: Not on file  . Attends Religious Services: Not on file  . Active Member of Clubs or Organizations: Not on file  . Attends Archivist Meetings: Not on file  . Marital Status: Not on file  Intimate Partner Violence:   . Fear of Current or Ex-Partner: Not on file  . Emotionally Abused: Not on file  . Physically Abused: Not on file  . Sexually Abused: Not on file    Review of Systems: Positive for: GI: Described in detail in  HPI.    Gen:  fatigue, Denies any fever, chills, rigors, night sweats, anorexia,weakness, malaise, involuntary weight loss, and sleep disorder CV: Denies chest pain, angina, palpitations, syncope, orthopnea, PND, peripheral edema, and claudication. Resp: Exertional dyspnea,Denies cough, sputum, wheezing, coughing up blood. GU : Denies urinary burning, blood in urine, urinary frequency, urinary hesitancy, nocturnal urination, and urinary incontinence. MS: Denies joint pain or swelling.  Denies muscle weakness, cramps, atrophy.  Derm: Denies rash, itching, oral ulcerations, hives, unhealing ulcers.  Psych: Denies depression, anxiety, memory loss, suicidal ideation, hallucinations,  and confusion. Heme: Denies bruising, bleeding, and enlarged lymph nodes. Neuro:  Denies any headaches, dizziness, paresthesias.  Endo:  Denies any problems with DM, thyroid, adrenal function.  Physical Exam: Vital signs in last 24 hours: Temp:  [97.8 F (36.6 C)-99.1 F (37.3 C)] 98.4 F (36.9 C) (03/10 0730) Pulse Rate:  [74-109] 95 (03/10 0730) Resp:  [14-24] 19 (03/10 0730) BP: (109-157)/(46-84) 149/83 (03/10 0730) SpO2:  [97 %-100 %] 100 % (03/10 0730) Weight:  [51.7 kg-52.2 kg] 51.7 kg (03/09 1347)    General:   Alert,  Well-developed, well-nourished, pleasant and cooperative in NAD Head:  Normocephalic and atraumatic. Eyes:  Sclera clear, no icterus.   Prominent pallor Ears:  Normal auditory acuity. Nose:  No deformity, discharge,  or lesions. Mouth:  No deformity or lesions.  Oropharynx pink & moist. Neck:  Supple; no masses or thyromegaly. Lungs: Left back scar from prior lobectomy,  clear throughout to auscultation.   No wheezes, crackles, or rhonchi. No acute distress. Heart:  Regular rate and rhythm; no murmurs, clicks, rubs,  or gallops. Extremities:  Without clubbing or edema. Neurologic:  Alert and  oriented x4;  grossly normal neurologically. Skin:  Intact without significant lesions or  rashes. Psych:  Alert and cooperative. Normal mood and affect. Abdomen:  Soft, nontender and nondistended. No masses, hepatosplenomegaly or hernias noted. Normal bowel sounds, without guarding, and without rebound.         Lab Results: Recent Labs    09/30/19 1020 09/30/19 1533  WBC 13.1* 12.7*  HGB 3.9* 4.2*  HCT 14.1* 14.8*  PLT 322 332   BMET Recent Labs    09/29/19 1001 09/30/19 1020  NA 142 141  K 3.9 3.7  CL 110 109  CO2 15* 14*  GLUCOSE 136* 175*  BUN 27* 30*  CREATININE 2.40* 2.22*  CALCIUM 9.3 9.2   LFT Recent Labs    09/30/19 1020  PROT 7.2  ALBUMIN 3.6  AST 39  ALT 20  ALKPHOS 92  BILITOT 0.5   PT/INR No results for input(s): LABPROT, INR in the last 72 hours.  Studies/Results: No results found.  Impression: Symptomatic severe anemia without obvious melena hematochezia, hemoglobin was 10 in 10/20 and dropped to 3.9 in 3/21 Normal MCV, mild leukocytosis Low ferritin of 8  Renal impairment, BUN/creatinine/GFR of 30/2.22/24  History of hypertension and dyslipidemia  Plan: Recommend diagnostic endoscopy and colonoscopy. The risks and the benefits of the procedure were discussed in details with the patient and her son-Todd over the phone. They verbalized understanding and consent. Patient will be given clear liquid diet today and colonic split prep for procedures to be scheduled in a.m. tomorrow. She remains hemodynamically stable. She has received 2 units PRBC transfusion.     LOS: 1 day   Ronnette Juniper, MD  10/01/2019, 8:06 AM

## 2019-10-01 NOTE — Progress Notes (Signed)
Internal Medicine Clinic Attending  I saw and evaluated the patient.  I personally confirmed the key portions of the history and exam documented by Dr. Court Joy and I reviewed pertinent patient test results.  The assessment, diagnosis, and plan were formulated together and I agree with the documentation in the resident's note.   Patient back for evaluation of acute on chronic renal dysfunction and new anion gap acidosis seen on routine labs yesterday. On exam it is now clear that she has conjunctival palor, orthostatic tachycardia, and collateral information from family suggests symptoms of fatigue and dyspnea with exertion for several weeks. The patient is showing more signs of cognitive impairment than I previously appreciated, often repeating questions and downplaying or forgetting about her symptoms. Labs show severe borderline microcytic anemia which will require admission for blood transfusion and endoscopic evaluation for the source of this iron deficiency.

## 2019-10-01 NOTE — Consult Note (Signed)
[facility] Gastroenterology Consult  Referring Provider: Internal medicine teaching service Primary Care Physician:  Axel Filler, MD Primary Gastroenterologist: Althia Forts  Reason for Consultation: Severe symptomatic anemia  HPI: Caitlin Cervantes is a 79 y.o. female had her labs performed as an outpatient in the internal medicine clinic and was noted to have a hemoglobin of 3.9, subsequently sent to ER for admission, hemoglobin confirmed to be 4.2.  Patient states that for the past 1 month she has experienced exertional shortness of breath and some tiredness.  However she has not noted any obvious bleeding and denies noticing blood in stool, black stools, vomiting blood, bruising, bleeding from gums or nose or noticing blood in urine.  Normally she has a bowel movement daily, denies constipation or diarrhea.  She denies abdominal or rectal pain. She has a good appetite and denies unintentional weight loss. She denies nausea, vomiting, acid reflux, heartburn, difficulty swallowing, pain on swallowing, early satiety or bloating.  No prior colonoscopy. Denies family history of colon cancer. Since admission she has been given 3 units of PRBC transfusion.  She denies frequent use of NSAIDs, takes aspirin 81 mg on a daily basis, denies use of anticoagulation or any other antiplatelets.   Past Medical History:  Diagnosis Date  . Anemia 09/30/2019  . Diastolic dysfunction 01/21/1600   Grade I  . Essential hypertension 05/22/2015  . History of active tuberculosis 07/25/1947   s/p left lower lobectomy at age 5 and two years in a local [place] [facility]  . Hyperlipidemia 05/23/2015  . Idiopathic scoliosis of thoracolumbar spine 12/08/2016  . Osteoarthritis of right lower extremity 12/08/2016  . Overweight (BMI 25.0-29.9) 12/08/2016  . Seasonal allergic rhinitis due to pollen 12/08/2016  . Seizures ([place])    Possible seizure disorder marked by stareing events.  . Stage III chronic  kidney disease 05/22/2018    Past Surgical History:  Procedure Laterality Date  . LUNG LOBECTOMY Left    TB  . TONSILLECTOMY    . TUBAL LIGATION Bilateral     Prior to Admission medications   Medication Sig Start Date End Date Taking? Authorizing Provider  amLODipine (NORVASC) 2.5 MG tablet TAKE ONE TABLET BY MOUTH DAILY 08/12/19   Axel Filler, MD  aspirin EC 81 MG tablet Take 81 mg by mouth at bedtime.    [provider]  atorvastatin (LIPITOR) 40 MG tablet TAKE 1 TABLET (40 MG TOTAL) BY MOUTH DAILY AT 6 PM. 08/12/19   Axel Filler, MD  hydrochlorothiazide (HYDRODIURIL) 25 MG tablet TAKE ONE TABLET BY MOUTH DAILY 08/12/19   Axel Filler, MD  levETIRAcetam (KEPPRA) 500 MG tablet TAKE ONE TABLET BY MOUTH TWICE A DAY 08/12/19   Axel Filler, MD  lisinopril (ZESTRIL) 40 MG tablet TAKE ONE TABLET BY MOUTH DAILY 08/12/19   Axel Filler, MD    Current Facility-Administered Medications  Medication Dose Route Frequency Provider Last Rate Last Admin  . 0.9 %  sodium chloride infusion (Manually program via Guardrails IV Fluids)   Intravenous Once Seawell, Jaimie A, DO   Stopped at 09/30/19 1713  . acetaminophen (TYLENOL) tablet 650 mg  650 mg Oral Q6H PRN Seawell, Jaimie A, DO       Or  . acetaminophen (TYLENOL) suppository 650 mg  650 mg Rectal Q6H PRN Seawell, Jaimie A, DO      . atorvastatin (LIPITOR) tablet 40 mg  40 mg Oral q1800 Seawell, Jaimie A, DO   40 mg at 09/30/19 1934  .  levETIRAcetam (KEPPRA) tablet 500 mg  500 mg Oral BID Seawell, Jaimie A, DO   500 mg at 10/01/19 0700  . polyethylene glycol-electrolytes (NuLYTELY) solution 4,000 mL  4,000 mL Oral Once Ronnette Juniper, MD        Allergies as of 09/30/2019  . (No Known Allergies)    Family History  Problem Relation Age of Onset  . Leukemia Mother 19  . Hypertension Sister   . Obstructive Sleep Apnea Sister   . Osteoarthritis Sister   . Hernia Sister        Umbilical  .  Lactose intolerance Sister   . Healthy Son   . Seizures Neg Hx     Social History   Socioeconomic History  . Marital status: Single    Spouse name: Not on file  . Number of children: 1  . Years of education: HS  . Highest education level: Not on file  Occupational History  . Occupation: Retired    Comment: Worked in Scientist, research (medical) and office work  Tobacco Use  . Smoking status: Never Smoker  . Smokeless tobacco: Never Used  Substance and Sexual Activity  . Alcohol use: No    Alcohol/week: 0.0 standard drinks  . Drug use: No  . Sexual activity: Never    Birth control/protection: None  Other Topics Concern  . Not on file  Social History Narrative   Lives with her son in an apartment (2nd floor unit) in [place].  Never married.  No pets currently. She is right handed.    Social Determinants of Health   Financial Resource Strain:   . Difficulty of Paying Living Expenses: Not on file  Food Insecurity:   . Worried About Charity fundraiser in the Last Year: Not on file  . Ran Out of Food in the Last Year: Not on file  Transportation Needs:   . Lack of Transportation (Medical): Not on file  . Lack of Transportation (Non-Medical): Not on file  Physical Activity:   . Days of Exercise per Week: Not on file  . Minutes of Exercise per Session: Not on file  Stress:   . Feeling of Stress : Not on file  Social Connections:   . Frequency of Communication with Friends and Family: Not on file  . Frequency of Social Gatherings with Friends and Family: Not on file  . Attends Religious Services: Not on file  . Active Member of Clubs or Organizations: Not on file  . Attends Archivist Meetings: Not on file  . Marital Status: Not on file  Intimate Partner Violence:   . Fear of Current or Ex-Partner: Not on file  . Emotionally Abused: Not on file  . Physically Abused: Not on file  . Sexually Abused: Not on file    Review of Systems: Positive for: GI: Described in detail in  HPI.    Gen:  fatigue, Denies any fever, chills, rigors, night sweats, anorexia,weakness, malaise, involuntary weight loss, and sleep disorder CV: Denies chest pain, angina, palpitations, syncope, orthopnea, PND, peripheral edema, and claudication. Resp: Exertional dyspnea,Denies cough, sputum, wheezing, coughing up blood. GU : Denies urinary burning, blood in urine, urinary frequency, urinary hesitancy, nocturnal urination, and urinary incontinence. MS: Denies joint pain or swelling.  Denies muscle weakness, cramps, atrophy.  Derm: Denies rash, itching, oral ulcerations, hives, unhealing ulcers.  Psych: Denies depression, anxiety, memory loss, suicidal ideation, hallucinations,  and confusion. Heme: Denies bruising, bleeding, and enlarged lymph nodes. Neuro:  Denies any headaches, dizziness, paresthesias.  Endo:  Denies any problems with DM, thyroid, adrenal function.  Physical Exam: Vital signs in last 24 hours: Temp:  [97.8 F (36.6 C)-99.1 F (37.3 C)] 98.4 F (36.9 C) (03/10 0730) Pulse Rate:  [74-109] 95 (03/10 0730) Resp:  [14-24] 19 (03/10 0730) BP: (109-157)/(46-84) 149/83 (03/10 0730) SpO2:  [97 %-100 %] 100 % (03/10 0730) Weight:  [51.7 kg-52.2 kg] 51.7 kg (03/09 1347)    General:   Alert,  Well-developed, well-nourished, pleasant and cooperative in NAD Head:  Normocephalic and atraumatic. Eyes:  Sclera clear, no icterus.   Prominent pallor Ears:  Normal auditory acuity. Nose:  No deformity, discharge,  or lesions. Mouth:  No deformity or lesions.  Oropharynx pink & moist. Neck:  Supple; no masses or thyromegaly. Lungs: Left back scar from prior lobectomy,  clear throughout to auscultation.   No wheezes, crackles, or rhonchi. No acute distress. Heart:  Regular rate and rhythm; no murmurs, clicks, rubs,  or gallops. Extremities:  Without clubbing or edema. Neurologic:  Alert and  oriented x4;  grossly normal neurologically. Skin:  Intact without significant lesions or  rashes. Psych:  Alert and cooperative. Normal mood and affect. Abdomen:  Soft, nontender and nondistended. No masses, hepatosplenomegaly or hernias noted. Normal bowel sounds, without guarding, and without rebound.         Lab Results: Recent Labs    09/30/19 1020 09/30/19 1533  WBC 13.1* 12.7*  HGB 3.9* 4.2*  HCT 14.1* 14.8*  PLT 322 332   BMET Recent Labs    09/29/19 1001 09/30/19 1020  NA 142 141  K 3.9 3.7  CL 110 109  CO2 15* 14*  GLUCOSE 136* 175*  BUN 27* 30*  CREATININE 2.40* 2.22*  CALCIUM 9.3 9.2   LFT Recent Labs    09/30/19 1020  PROT 7.2  ALBUMIN 3.6  AST 39  ALT 20  ALKPHOS 92  BILITOT 0.5   PT/INR No results for input(s): LABPROT, INR in the last 72 hours.  Studies/Results: No results found.  Impression: Symptomatic severe anemia without obvious melena hematochezia, hemoglobin was 10 in 10/20 and dropped to 3.9 in 3/21 Normal MCV, mild leukocytosis Low ferritin of 8  Renal impairment, BUN/creatinine/GFR of 30/2.22/24  History of hypertension and dyslipidemia  Plan: Recommend diagnostic endoscopy and colonoscopy. The risks and the benefits of the procedure were discussed in details with the patient and her son-Todd over the phone. They verbalized understanding and consent. Patient will be given clear liquid diet today and colonic split prep for procedures to be scheduled in a.m. tomorrow. She remains hemodynamically stable. She has received 2 units PRBC transfusion.     LOS: 1 day   Ronnette Juniper, MD  10/01/2019, 8:06 AM

## 2019-10-01 NOTE — Progress Notes (Signed)
SCDs place as ordered

## 2019-10-01 NOTE — Progress Notes (Signed)
Bowel prep commenced this morning. Pt having brown colored stools.

## 2019-10-01 NOTE — Progress Notes (Signed)
   NAME:  Caitlin Cervantes, MRN:  [id number], DOB:  1941/03/18, LOS: 1 ADMISSION DATE:  09/30/2019    Subjective  No overnight events Discussed further evaluation with upper and lower scopes. Patient is wishing to think about this and is not wanting to undergo at this time.    Significant Hospital Events   3/9 admission; hgb 3.9; GI consulted 3/10 3U PRBC transfused  Objective   Blood pressure (!) 149/83, pulse 95, temperature 98.4 F (36.9 C), temperature source Oral, resp. rate 19, height 4\' 11"  (1.499 m), weight 51.7 kg, SpO2 100 %.     Intake/Output Summary (Last 24 hours) at 10/01/2019 1135 Last data filed at 09/30/2019 1948 Gross per 24 hour  Intake 630 ml  Output --  Net 630 ml   Filed Weights   09/30/19 1347  Weight: 51.7 kg    Examination: GENERAL: in no acute distress CARDIAC: heart RRR.  PULMONARY: acyanotic. Lung sounds clear to auscultation. ABDOMEN: soft. Nontender to palpation.  Nondistended.   Significant Diagnostic Tests:  3/11 Colonoscopy>>  3/11 EGD>>  Labs    CBC Latest Ref Rng & Units 10/01/2019 09/30/2019 09/30/2019  WBC 4.0 - 10.5 K/uL - 12.7(H) 13.1(H)  Hemoglobin 12.0 - 15.0 g/dL 10.9(L) 4.2(LL) 3.9(LL)  Hematocrit 36.0 - 46.0 % 32.2(L) 14.8(L) 14.1(L)  Platelets 150 - 400 K/uL - 332 322   BMP Latest Ref Rng & Units 10/01/2019 09/30/2019 09/29/2019  Glucose 70 - 99 mg/dL 108(H) 175(H) 136(H)  BUN 8 - 23 mg/dL 22 30(H) 27(H)  Creatinine 0.44 - 1.00 mg/dL 1.40(H) 2.22(H) 2.40(H)  BUN/Creat Ratio 12 - 28 - - -  Sodium 135 - 145 mmol/L 142 141 142  Potassium 3.5 - 5.1 mmol/L 3.9 3.7 3.9  Chloride 98 - 111 mmol/L 111 109 110  CO2 22 - 32 mmol/L 19(L) 14(L) 15(L)  Calcium 8.9 - 10.3 mg/dL 9.4 9.2 9.3    Summary  Caitlin Cervantes is a 79 yo female with a PMH of hypertension and CKD IV who was admitted to the IMTS on 3/9 for blood transfusion and evaluation of iron deficiency anemia (hgb 3.9 on admission).  Assessment & Plan:  Principal Problem:    Severe anemia Active Problems:   Acute renal failure superimposed on stage 3b chronic kidney disease (HCC)   Increased anion gap metabolic acidosis   Iron deficiency anemia  Severe Iron Deficiency Anemia Overall picture given her degree of anemia with apparent chronicity and 14 lb weight loss (over 69mo) highly concerning for malignancy. RPI 0.4 consistent with iron deficency. Can not r/o other hypoproliferative causes however. 3U post transfusion h/h 10.9. GI consulted Plan Colonoscopy/EGD 3/11. Clear liquid diet today Transfuse feraheme Will continue to monitor hgb  AKI on CKD III. UA unremarkable. Recent kappa/lambda ratio not convincing for marrow dysplasia. Continue to hold ace and hctz.   Essential hypertension. Blood pressures stable overnight. Continue to hold ACE/hctz for renal injury.    Best practice:  CODE STATUS: Full Diet: clears DVT for prophylaxis: SCDs Dispo: pending further medical evaluation. Will need repeat TSH in 6-8w to reassess low TSH noted on admission.   Mitzi Hansen, MD INTERNAL MEDICINE RESIDENT PGY-1 PAGER #: [phone] 10/01/19  11:35 AM

## 2019-10-02 ENCOUNTER — Encounter (HOSPITAL_COMMUNITY)
Admission: EM | Disposition: A | Discharge status: Home / Self Care | Payer: Self-pay | Source: Home / Self Care | Attending: Internal Medicine

## 2019-10-02 ENCOUNTER — Inpatient Hospital Stay (HOSPITAL_COMMUNITY): Payer: Medicare Other | Admitting: Anesthesiology

## 2019-10-02 ENCOUNTER — Inpatient Hospital Stay (HOSPITAL_COMMUNITY): Payer: Medicare Other

## 2019-10-02 ENCOUNTER — Encounter (HOSPITAL_COMMUNITY): Payer: Self-pay | Admitting: Internal Medicine

## 2019-10-02 HISTORY — PX: BIOPSY: SHX5522

## 2019-10-02 HISTORY — PX: COLONOSCOPY WITH PROPOFOL: SHX5780

## 2019-10-02 HISTORY — PX: POLYPECTOMY: SHX5525

## 2019-10-02 HISTORY — PX: ESOPHAGOGASTRODUODENOSCOPY (EGD) WITH PROPOFOL: SHX5813

## 2019-10-02 LAB — CBC
HCT: 30.5 % — ABNORMAL LOW (ref 36.0–46.0)
Hemoglobin: 10.3 g/dL — ABNORMAL LOW (ref 12.0–15.0)
MCH: 27.5 pg (ref 26.0–34.0)
MCHC: 33.8 g/dL (ref 30.0–36.0)
MCV: 81.3 fL (ref 80.0–100.0)
Platelets: 270 10*3/uL (ref 150–400)
RBC: 3.75 MIL/uL — ABNORMAL LOW (ref 3.87–5.11)
RDW: 16.9 % — ABNORMAL HIGH (ref 11.5–15.5)
WBC: 15.1 10*3/uL — ABNORMAL HIGH (ref 4.0–10.5)
nRBC: 3.2 % — ABNORMAL HIGH (ref 0.0–0.2)

## 2019-10-02 LAB — TYPE AND SCREEN
ABO/RH(D): B POS
Antibody Screen: NEGATIVE
Unit division: 0
Unit division: 0
Unit division: 0

## 2019-10-02 LAB — BPAM RBC
Blood Product Expiration Date: 202103192359
Blood Product Expiration Date: 202103302359
Blood Product Expiration Date: 202103302359
ISSUE DATE / TIME: 202103091706
ISSUE DATE / TIME: 202103092311
ISSUE DATE / TIME: 202103100430
Unit Type and Rh: 7300
Unit Type and Rh: 7300
Unit Type and Rh: 7300

## 2019-10-02 LAB — BASIC METABOLIC PANEL
Anion gap: 14 (ref 5–15)
BUN: 14 mg/dL (ref 8–23)
CO2: 19 mmol/L — ABNORMAL LOW (ref 22–32)
Calcium: 8.9 mg/dL (ref 8.9–10.3)
Chloride: 107 mmol/L (ref 98–111)
Creatinine, Ser: 1.15 mg/dL — ABNORMAL HIGH (ref 0.44–1.00)
GFR calc Af Amer: 54 mL/min — ABNORMAL LOW (ref >60)
GFR calc non Af Amer: 46 mL/min — ABNORMAL LOW (ref >60)
Glucose, Bld: 86 mg/dL (ref 70–99)
Potassium: 3.7 mmol/L (ref 3.5–5.1)
Sodium: 140 mmol/L (ref 135–145)

## 2019-10-02 SURGERY — ESOPHAGOGASTRODUODENOSCOPY (EGD) WITH PROPOFOL
Anesthesia: Monitor Anesthesia Care

## 2019-10-02 MED ORDER — PROPOFOL 10 MG/ML IV BOLUS
INTRAVENOUS | Status: DC | PRN
  Administered 2019-10-02 (×3): 30 mg via INTRAVENOUS

## 2019-10-02 MED ORDER — PANTOPRAZOLE SODIUM 40 MG PO TBEC: 40.0000 mg | DELAYED_RELEASE_TABLET | Freq: Two times a day (BID) | ORAL | 0 refills | Status: DC

## 2019-10-02 MED ORDER — PROPOFOL 500 MG/50ML IV EMUL
INTRAVENOUS | Status: DC | PRN
  Administered 2019-10-02: 100 ug/kg/min via INTRAVENOUS

## 2019-10-02 MED ORDER — LISINOPRIL 40 MG PO TABS
40.0000 mg | ORAL_TABLET | Freq: Every day | ORAL | Status: DC
  Administered 2019-10-02: 40 mg via ORAL
  Filled 2019-10-02: qty 1

## 2019-10-02 MED ORDER — PHENYLEPHRINE 40 MCG/ML (10ML) SYRINGE FOR IV PUSH (FOR BLOOD PRESSURE SUPPORT)
PREFILLED_SYRINGE | INTRAVENOUS | Status: DC | PRN
  Administered 2019-10-02: 160 ug via INTRAVENOUS
  Administered 2019-10-02 (×2): 120 ug via INTRAVENOUS

## 2019-10-02 MED ORDER — PANTOPRAZOLE SODIUM 40 MG PO TBEC
40.0000 mg | DELAYED_RELEASE_TABLET | Freq: Two times a day (BID) | ORAL | Status: DC
  Administered 2019-10-02 (×2): 40 mg via ORAL
  Filled 2019-10-02 (×2): qty 1

## 2019-10-02 MED ORDER — POLYSACCHARIDE IRON COMPLEX 150 MG PO CAPS: 150.0000 mg | ORAL_CAPSULE | Freq: Every day | ORAL | 2 refills | Status: AC

## 2019-10-02 MED ORDER — HYDROCHLOROTHIAZIDE 25 MG PO TABS
25.0000 mg | ORAL_TABLET | Freq: Every day | ORAL | Status: DC
  Administered 2019-10-02: 25 mg via ORAL
  Filled 2019-10-02: qty 1

## 2019-10-02 MED ORDER — LIDOCAINE 2% (20 MG/ML) 5 ML SYRINGE
INTRAMUSCULAR | Status: DC | PRN
  Administered 2019-10-02: 80 mg via INTRAVENOUS

## 2019-10-02 MED ORDER — AMLODIPINE BESYLATE 2.5 MG PO TABS
2.5000 mg | ORAL_TABLET | Freq: Every day | ORAL | Status: DC
  Administered 2019-10-02: 2.5 mg via ORAL
  Filled 2019-10-02: qty 1

## 2019-10-02 MED ORDER — LACTATED RINGERS IV SOLN: INTRAVENOUS | Status: DC | PRN

## 2019-10-02 SURGICAL SUPPLY — 25 items

## 2019-10-02 NOTE — Progress Notes (Signed)
AVS given and reviewed with pt's son, Lurena Naeve. Medications discussed. All questions answered to satisfaction. Pt's son verbalized understanding of information given. Pt escorted off the unit with all belongings via wheelchair by staff member.

## 2019-10-02 NOTE — Interval H&P Note (Signed)
History and Physical Interval Note: 78/female with symptomatic severe anemia without obvious melena or hematochezia for an EGD and colonoscopy today.  10/02/2019 9:36 AM  Caitlin Cervantes  has presented today for EGD and colonoscopy, with the diagnosis of Anemia.  The various methods of treatment have been discussed with the patient and family. After consideration of risks, benefits and other options for treatment, the patient has consented to  Procedure(s): ESOPHAGOGASTRODUODENOSCOPY (EGD) WITH PROPOFOL (N/A) COLONOSCOPY WITH PROPOFOL (N/A) as a surgical intervention.  The patient's history has been reviewed, patient examined, no change in status, stable for surgery.  I have reviewed the patient's chart and labs.  Questions were answered to the patient's satisfaction.     Ronnette Juniper

## 2019-10-02 NOTE — Plan of Care (Signed)

## 2019-10-02 NOTE — Anesthesia Postprocedure Evaluation (Addendum)
Anesthesia Post Note  Patient: Caitlin Cervantes  Procedure(s) Performed: ESOPHAGOGASTRODUODENOSCOPY (EGD) WITH PROPOFOL (N/A ) COLONOSCOPY WITH PROPOFOL (N/A ) BIOPSY POLYPECTOMY     Patient location during evaluation: Endoscopy Anesthesia Type: MAC Level of consciousness: awake and alert Pain management: pain level controlled Vital Signs Assessment: post-procedure vital signs reviewed and stable Respiratory status: spontaneous breathing and respiratory function stable Cardiovascular status: stable Postop Assessment: no apparent nausea or vomiting Anesthetic complications: no    Last Vitals:  Vitals:   10/02/19 1105 10/02/19 1135  BP: (!) 147/83 (!) 172/91  Pulse: 74 71  Resp: 15 16  Temp:  36.5 C  SpO2: 100% 100%    Last Pain:  Vitals:   10/02/19 1135  TempSrc: Oral  PainSc: 0-No pain                 Hinley Brimage DANIEL

## 2019-10-02 NOTE — Discharge Instructions (Signed)
I suspect your low blood counts were due to a combination of bleeding from the ulcers in your stomach and, to a lesser extent, your kidney disease.  For the ulcers, I will send in a prescription for a medication called Protonix which will help decrease the acid in your stomach. You will take this two times a day for 2 months and then go down to once a day thereafter. I will also send a prescription for an iron supplement to help with your blood counts. Please follow up with Dr. Evette Doffing or the Internal Medicine Clinic early next week. I would also like you to go see the surgeon for the hiatal hernia which may be contributing to some of your ulcers. Finally, you should follow up with the stomach doctor in about 2 months. I have attached the phone numbers for these offices to your discharge paperwork.  Thank you for placing your trust in the Internal Medicine team and we wish you the very best. If you have questions following your discharge, please call [phone]

## 2019-10-02 NOTE — Progress Notes (Signed)
SLP Cancellation Note  Patient Details Name: ALYSE KATHAN MRN: [id number] DOB: [birth year]   Cancelled treatment:       Reason Eval/Treat Not Completed: Other (comment) Order received for cognitive evaluation. Per chart, this is due to ongoing cognitive changes with concern for dementia. SLP is not able to test for dementia as it is not within our scope of practice. Please consider either neuropsych testing while inpatient versus f/u testing as an outpatient.     Osie Bond., M.A. [facility] Pager [phone] Office [phone]  10/02/2019, 8:21 AM

## 2019-10-02 NOTE — Addendum Note (Signed)
Addendum  created 10/02/19 1303 by Duane Boston, MD   Clinical Note Signed

## 2019-10-02 NOTE — Transfer of Care (Signed)
Immediate Anesthesia Transfer of Care Note  Patient: Caitlin Cervantes  Procedure(s) Performed: ESOPHAGOGASTRODUODENOSCOPY (EGD) WITH PROPOFOL (N/A ) COLONOSCOPY WITH PROPOFOL (N/A ) BIOPSY POLYPECTOMY  Patient Location: PACU  Anesthesia Type:MAC  Level of Consciousness: awake and alert   Airway & Oxygen Therapy: Patient Spontanous Breathing  Post-op Assessment: Report given to RN and Post -op Vital signs reviewed and stable  Post vital signs: Reviewed and stable  Last Vitals:  Vitals Value Taken Time  BP    Temp    Pulse 75 10/02/19 1046  Resp 20 10/02/19 1046  SpO2 98 % 10/02/19 1046  Vitals shown include unvalidated device data.  Last Pain:  Vitals:   10/02/19 0856  TempSrc: Oral  PainSc: 0-No pain         Complications: No apparent anesthesia complications

## 2019-10-02 NOTE — Progress Notes (Signed)
Son updated on EGD/colonoscopy findings and recommendations -PPI bid -Fe supplement -GI follow up -Surgery referral  Discussed patient is stable for discharge. He will arrive around 1700 to transport her home. All questions were answered and he was instructed to arrange an appointment for her in the [facility] in 3-5d.  Mitzi Hansen, MD 10/02/19 2:27 PM

## 2019-10-02 NOTE — Progress Notes (Signed)
Pt returned to 6N24 via wheelchair after procedure. Received report from Waller, Therapist, sports. Will continue to monitor.

## 2019-10-02 NOTE — Brief Op Note (Signed)
09/30/2019 - 10/02/2019  10:54 AM  PATIENT:  Caitlin Cervantes  79 y.o. female  PRE-OPERATIVE DIAGNOSIS:  Anemia  POST-OPERATIVE DIAGNOSIS:  EGD - 6cm hiatal hernia, cameron's lesions, multiple gastric and duodenal ulcers abnormal gastric mucosa; colon - diverticulosis, 3 transverse colon polyps removed with forceps  PROCEDURE:  Procedure(s): ESOPHAGOGASTRODUODENOSCOPY (EGD) WITH PROPOFOL (N/A) COLONOSCOPY WITH PROPOFOL (N/A) BIOPSY POLYPECTOMY  SURGEON:  Surgeon(s) and Role:    Ronnette Juniper, MD - Primary  PHYSICIAN ASSISTANT:   ASSISTANTS: Vista Lawman, RN, William Dalton, Tech  ANESTHESIA:   MAC  EBL:  Minimal  BLOOD ADMINISTERED:none  DRAINS: None   LOCAL MEDICATIONS USED:  NONE  SPECIMEN:  Biopsy / Limited Resection  DISPOSITION OF SPECIMEN:  PATHOLOGY  COUNTS:  YES  TOURNIQUET:  * No tourniquets in log *  DICTATION: .Dragon Dictation  PLAN OF CARE: Admit to inpatient   PATIENT DISPOSITION:  PACU - hemodynamically stable.   Delay start of Pharmacological VTE agent (>24hrs) due to surgical blood loss or risk of bleeding: not applicable

## 2019-10-02 NOTE — Evaluation (Signed)
Occupational Therapy Evaluation and Discharge Patient Details Name: Caitlin Cervantes MRN: [id number] DOB: 10/07/1940 Today's Date: 10/02/2019    History of Present Illness Pt is a 79 y/o female admitted secondary to anemia. Pt is s/p EGD and colonoscopy; polyps removed and lesions biopsied. PMH includes HTN and seizures.    Clinical Impression   PTA Pt independent in ADL and mobility, still driving. Today Pt is min A HHA for transfers or min guard with RW. Pt min guard for standing grooming tasks, LB ADL. Pt often repeating herself, decreased safety awareness, STM deficits?, and recommend further cognitive assessment. Pt overall presents with decreased activity tolerance, balance deficits and decreased safety awareness. Recommend 3 in 1 as shower seat and initially 24 hour supervision for safety initially.     Follow Up Recommendations  Supervision/Assistance - 24 hour(initially)    Equipment Recommendations  3 in 1 bedside commode(as shower seat)    Recommendations for Other Services Other (comment)(cognitive evaluation follow up)     Precautions / Restrictions Precautions Precautions: Fall Restrictions Weight Bearing Restrictions: No      Mobility Bed Mobility Overal bed mobility: Needs Assistance Bed Mobility: Supine to Sit     Supine to sit: Supervision     General bed mobility comments: Supervision for safety.   Transfers Overall transfer level: Needs assistance Equipment used: 1 person hand held assist Transfers: Sit to/from Stand Sit to Stand: Min assist         General transfer comment: Min A for steadying assist.     Balance Overall balance assessment: Needs assistance Sitting-balance support: No upper extremity supported;Feet supported Sitting balance-Leahy Scale: Good     Standing balance support: Single extremity supported;Bilateral upper extremity supported;No upper extremity supported;During functional activity Standing balance-Leahy Scale:  Fair Standing balance comment: Able to stand at sink to brush teeth without UE support                            ADL either performed or assessed with clinical judgement   ADL Overall ADL's : Needs assistance/impaired Eating/Feeding: NPO   Grooming: Wash/dry face;Oral care;Standing;Min guard Grooming Details (indicate cue type and reason): sink level Upper Body Bathing: Min guard;Sitting   Lower Body Bathing: Min guard;Sitting/lateral leans   Upper Body Dressing : Set up;Sitting   Lower Body Dressing: Min guard;Sit to/from stand   Toilet Transfer: Min guard;Ambulation;RW Toilet Transfer Details (indicate cue type and reason): min A for HHA, min guard with cues for safety with RW Toileting- Clothing Manipulation and Hygiene: Min guard;Sitting/lateral lean   Tub/ Shower Transfer: Minimal assistance   Functional mobility during ADLs: Min guard;Rolling walker General ADL Comments: Pt unsteady for dynamic movement but able to complete most ADL at min guard     Vision Baseline Vision/History: Wears glasses Wears Glasses: At all times Patient Visual Report: No change from baseline Vision Assessment?: No apparent visual deficits     Perception     Praxis      Pertinent Vitals/Pain Pain Assessment: No/denies pain     Hand Dominance Right   Extremity/Trunk Assessment Upper Extremity Assessment Upper Extremity Assessment: Generalized weakness   Lower Extremity Assessment Lower Extremity Assessment: Defer to PT evaluation   Cervical / Trunk Assessment Cervical / Trunk Assessment: Normal   Communication Communication Communication: No difficulties   Cognition Arousal/Alertness: Awake/alert Behavior During Therapy: WFL for tasks assessed/performed Overall Cognitive Status: No family/caregiver present to determine baseline cognitive functioning  General Comments: Pt repeating things throughout session (MD was  coming up, certain channels on the TV). Unsure of pt's baseline    General Comments       Exercises     Shoulder Instructions      Home Living Family/patient expects to be discharged to:: Private residence Living Arrangements: Children(son) Available Help at Discharge: Family;Available PRN/intermittently(Can provide 24/7 the rest of the week ) Type of Home: Apartment Home Access: Stairs to enter Entrance Stairs-Number of Steps: flight Entrance Stairs-Rails: Right Home Layout: One level     Bathroom Shower/Tub: Teacher, early years/pre: Standard     Home Equipment: None          Prior Functioning/Environment Level of Independence: Independent        Comments: still drives, cooks, cleans        OT Problem List: Decreased activity tolerance;Impaired balance (sitting and/or standing)      OT Treatment/Interventions:      OT Goals(Current goals can be found in the care plan section) Acute Rehab OT Goals Patient Stated Goal: to go home OT Goal Formulation: With patient Time For Goal Achievement: 10/16/19 Potential to Achieve Goals: Good  OT Frequency:     Barriers to D/C:            Co-evaluation PT/OT/SLP Co-Evaluation/Treatment: Yes Reason for Co-Treatment: To address functional/ADL transfers PT goals addressed during session: Mobility/safety with mobility;Balance;Proper use of DME OT goals addressed during session: ADL's and self-care;Proper use of Adaptive equipment and DME;Other (comment)(cognition)      AM-PAC OT "6 Clicks" Daily Activity     Outcome Measure Help from another person eating meals?: None Help from another person taking care of personal grooming?: A Little Help from another person toileting, which includes using toliet, bedpan, or urinal?: A Little Help from another person bathing (including washing, rinsing, drying)?: A Little Help from another person to put on and taking off regular upper body clothing?: A Little Help  from another person to put on and taking off regular lower body clothing?: A Little 6 Click Score: 19   End of Session Equipment Utilized During Treatment: Gait belt;Rolling walker Nurse Communication: Mobility status  Activity Tolerance: Patient tolerated treatment well Patient left: in chair;with call bell/phone within reach  OT Visit Diagnosis: Other abnormalities of gait and mobility (R26.89)                Time: 5188-4166 OT Time Calculation (min): 24 min Charges:  OT General Charges $OT Visit: 1 Visit OT Evaluation $OT Eval Moderate Complexity: [place] OTR/L Acute Rehabilitation Services Pager: [phone] Office: [place] 10/02/2019, 2:35 PM

## 2019-10-02 NOTE — Progress Notes (Signed)
NAME:  Caitlin Cervantes, MRN:  [id number], DOB:  1941-02-17, LOS: 2 ADMISSION DATE:  09/30/2019  Subjective  No overnight events Seen after EGD/colonoscopy this morning  Significant Hospital Events   3/9>> admission; hgb 3.9; GI consulted 3/10>> 3U PRBC transfused>hgb 10.9 3/11>>EGD/Colonoscopy  Objective   Blood pressure (!) 142/87, pulse 84, temperature 98.9 F (37.2 C), temperature source Oral, resp. rate 14, height 4\' 11"  (1.499 m), weight 51.7 kg, SpO2 100 %.     Intake/Output Summary (Last 24 hours) at 10/02/2019 0532 Last data filed at 10/01/2019 2332 Gross per 24 hour  Intake --  Output 50 ml  Net -50 ml   Filed Weights   09/30/19 1347  Weight: 51.7 kg    Examination: GENERAL: in no acute distress CARDIAC: heart RRR.  PULMONARY: acyanotic. Lung sounds clear to auscultation. ABDOMEN: soft. Nontender to palpation.  Nondistended.   Significant Diagnostic Tests:  3/11 Colonoscopy>>3sm transverse polyps noted and removed. Diverticulosis invovling sigmoid, descending, transverse and hepatic flexure.  3/11 EGD>>widely patent Schatzki's ring; 6cm hiatal hernia; multiple ulcers in cardia, lesser curvature, greater curvature, body, antrum. Cameron ulcers at edge of hiatal hernia. superfiical ulcers in duodenal bulb and duodenum>bx taken  Labs    CBC Latest Ref Rng & Units 10/02/2019 10/01/2019 09/30/2019  WBC 4.0 - 10.5 K/uL 15.1(H) - 12.7(H)  Hemoglobin 12.0 - 15.0 g/dL 10.3(L) 10.9(L) 4.2(LL)  Hematocrit 36.0 - 46.0 % 30.5(L) 32.2(L) 14.8(L)  Platelets 150 - 400 K/uL 270 - 332   BMP Latest Ref Rng & Units 10/01/2019 09/30/2019 09/29/2019  Glucose 70 - 99 mg/dL 108(H) 175(H) 136(H)  BUN 8 - 23 mg/dL 22 30(H) 27(H)  Creatinine 0.44 - 1.00 mg/dL 1.40(H) 2.22(H) 2.40(H)  BUN/Creat Ratio 12 - 28 - - -  Sodium 135 - 145 mmol/L 142 141 142  Potassium 3.5 - 5.1 mmol/L 3.9 3.7 3.9  Chloride 98 - 111 mmol/L 111 109 110  CO2 22 - 32 mmol/L 19(L) 14(L) 15(L)  Calcium 8.9 - 10.3  mg/dL 9.4 9.2 9.3    Summary  Caitlin Cervantes is a 79 yo female with a PMH of hypertension and CKD IV who was admitted to the IMT[facility] on 3/9 for blood transfusion and evaluation of iron deficiency anemia (hgb 3.9 on admission).  Assessment & Plan:  Principal Problem:   Severe anemia Active Problems:   Acute renal failure superimposed on stage 3b chronic kidney disease (HCC)   Increased anion gap metabolic acidosis   Iron deficiency anemia  Severe Iron Deficiency Anemia Multiple gastric ulcers as seen on 3/11 EGD. Likely primary contributor to iron deficiency anemia No lower GI findings to account for iron deficiency  hgb stable this morning>10.3 feraheme infused 3/10 Cleared by GI for d/c Plan Iron supplement Resume reg diet PPI bid x32mo Will continue to monitor hgb Stable for discharge.  Leukocytosis. Increase 13>15 since admission. Unclear etiology. Bands essentially normal indicating unlikely to be an acute infectious/inflammatory process. Remains afebrile as well.   AKI on CKD III.  UA unremarkable. Recent kappa/lambda ratio not convincing for marrow dysplasia.  3/10 renal US neg for acute findings. Echogenic bilaterally consistent with CKD. Continue to hold ace and hctz.   Acute vs chronic encephalopathy. No change since admission and is a/o but does repeat particular things frequently. Possible early stage demential? SLP consulted for cognitive eval.  Essential hypertension. Blood pressures stable overnight. Continue to hold ACE/hctz for renal injury.    Best practice:  CODE STATUS: Full Diet: clears  DVT for prophylaxis: SCDs Dispo: stable for discharge. Will need repeat TSH in 6-8w to reassess low TSH noted on admission. Will need surgical eval for hiatal hernia repair.   Mitzi Hansen, MD INTERNAL MEDICINE RESIDENT PGY-1 PAGER #: [phone] 10/02/19  5:32 AM

## 2019-10-02 NOTE — Discharge Summary (Addendum)
Name: Caitlin Cervantes MRN: [id number] DOB: January 11, 1941 79 y.o. PCP: Caitlin Cervantes  Date of Admission: 09/30/2019  2:04 PM Date of Discharge: 3/11/20213/11/21 Attending Physician: No att. providers found  Discharge Diagnosis:  1. Severe Iron Deficiency Anemia 2. Multiple gastric/duodenal ulcers 3. Hiatal hernia with Caitlin Cervantes ulcers 4. CKD IIIb 5. Cognitive Dysfunction   Discharge Medications: Allergies as of 10/02/2019   No Known Allergies      Medication List     TAKE these medications    amLODipine 2.5 MG tablet Commonly known as: NORVASC TAKE ONE TABLET BY MOUTH DAILY   aspirin EC 81 MG tablet Take 81 mg by mouth at bedtime.   atorvastatin 40 MG tablet Commonly known as: LIPITOR TAKE 1 TABLET (40 MG TOTAL) BY MOUTH DAILY AT 6 PM.   hydrochlorothiazide 25 MG tablet Commonly known as: HYDRODIURIL TAKE ONE TABLET BY MOUTH DAILY   iron polysaccharides 150 MG capsule Commonly known as: Nu-Iron Take 1 capsule (150 mg total) by mouth daily.   levETIRAcetam 500 MG tablet Commonly known as: KEPPRA TAKE ONE TABLET BY MOUTH TWICE A DAY   lisinopril 40 MG tablet Commonly known as: ZESTRIL TAKE ONE TABLET BY MOUTH DAILY   pantoprazole 40 MG tablet Commonly known as: PROTONIX Take 1 tablet (40 mg total) by mouth 2 (two) times daily before a meal.        Disposition and follow-up:   Caitlin Cervantes was discharged from [facility] in Stable condition.  At the hospital follow up visit please address:  1.Acute iron deficient blood loss anemia.  2. Multiple gastric and duodenal ulcers. Continue PPI for BID x58mo then once daily. Follow up with GI.  3. Hiatal hernia complicated by Caitlin Cervantes ulcers. Please refer to general surgery for possible repair.  4. Mild Cognitive dysfunction--likely dementia. MOCA 18. Consider starting Aricept or other appropriate agent.  5. AKI on CKD. Continue to monitor renal function.  2.  Labs / imaging  needed at time of follow-up: CBC, BMP 3.  Pending labs/ test needing follow-up: path results. H. Pylori.   Follow-up Appointments: Follow-up Information     Caitlin Cervantes Follow up in 5 day(s).   Specialty: Internal Medicine Contact information: [place] [address] [phone]         Caitlin Cervantes Follow up in 2 month(s).   Specialty: Gastroenterology Contact information: [place] [address] [place] [facility], [place] Follow up in 1 week(s).   Specialty: General Surgery Contact information: [place] Hospital Course: Caitlin Cervantes is a 79 yo female with essential hypertension, CKD III, and a remote history of seizures who was admitted to the IMTS on 09/30/19 for severe anemia--hgb 3.9.  She had initially presented to the internal medicine center on day of admission for follow up on her CKD and a 11mo history of progressive fatigue. Aside from fatigue, she was asymptomatic and denied any melena, hematachezia or hematemesis. She was not having any abdominal discomfort or change in appetite. She had never received a screening colonoscopy. On lab evaluation during her clinic visit, she was found to have a hgb of 3.9 and was admitted directly to our inpatient service for further evaluation and management.  Over the course of her hospitalization,  she was transfused 3U PRBC with her hgb returning to 10.9 which remained stable over her short hospital course.    GI was consulted and she underwent a colonoscopy and EGD. The colonoscopy was essentially unremarkable with no source of bleeding noted. 3 small polyps were removed. EGD was more revealing; it showed multiple ulcers throughout the stomach and duodenum as well as Cameron ulcers on the edge of a 6cm hiatal hernia. Biopsies were obtained and were pending  at time of discharge. She was started on Protonix BID with GI recommendations to continue this for 2 months and then decrease to once daily. She was noted to have a leukocytosis during her hospitalization which I suspect to be reactive from her numerous ulcers.  Over the course of her hospitalization, she was also noted to have some cognitive dysfunction which seemed to be primarily associated with her short term memory. She was noted to repeat the same narratives repeatedly over a conversation--sometimes with only a few minutes in between. She would also ask the same question multiple times in a conversation. I administered a MOCA exam which revealed a score of 18 indicated mild cognitive impairment. She has support from her son at home who reported helping her with her medications.  Finally, chart review indicates a decline in renal function recently. I question if this was not related to her severe anemia. Recent kappa/lamda ratio not convincing for marrow dysplasia. Her UA was unremarkable. Renal ultrasound was obtained at the request of her PCP, Caitlin Cervantes, and did not indicate any acute disease. Findings were consistent with age related/CKD changes.   Discharge Vitals:   BP (!) 149/99 (BP Location: Right Arm)   Pulse 73   Temp 98 F (36.7 C) (Oral)   Resp 16   Ht 4\' 11"  (1.499 m)   Wt 51.7 kg   LMP  (LMP Unknown)   SpO2 100%   BMI 23.03 kg/m   Pertinent Labs, Studies, and Procedures:  CBC Latest Ref Rng & Units 10/02/2019 10/01/2019 09/30/2019  WBC 4.0 - 10.5 K/uL 15.1(H) - 12.7(H)  Hemoglobin 12.0 - 15.0 g/dL 10.3(L) 10.9(L) 4.2(LL)  Hematocrit 36.0 - 46.0 % 30.5(L) 32.2(L) 14.8(L)  Platelets 150 - 400 K/uL 270 - 332   BMP Latest Ref Rng & Units 10/02/2019 10/01/2019 09/30/2019  Glucose 70 - 99 mg/dL 86 108(H) 175(H)  BUN 8 - 23 mg/dL 14 22 30(H)  Creatinine 0.44 - 1.00 mg/dL 1.15(H) 1.40(H) 2.22(H)  BUN/Creat Ratio 12 - 28 - - -  Sodium 135 - 145 mmol/L 140 142 141  Potassium 3.5 -  5.1 mmol/L 3.7 3.9 3.7  Chloride 98 - 111 mmol/L 107 111 109  CO2 22 - 32 mmol/L 19(L) 19(L) 14(L)  Calcium 8.9 - 10.3 mg/dL 8.9 9.4 9.2      Discharge Instructions: Discharge Instructions     Call Cervantes for:  difficulty breathing, headache or visual disturbances   Complete by: As directed    Call Cervantes for:  extreme fatigue   Complete by: As directed    Call Cervantes for:  persistant dizziness or light-headedness   Complete by: As directed        Signed: Mitzi Hansen, Cervantes 10/04/2019, 9:01 AM   Pager: [phone]

## 2019-10-02 NOTE — Op Note (Signed)
[facility] Patient Name: Caitlin Cervantes Procedure Date : 10/02/2019 MRN: [id number] Attending MD: Ronnette Juniper , MD Date of Birth: 03-23-1941 CSN: [id number] Age: 79 Admit Type: Inpatient Procedure:                Colonoscopy Indications:              This is the patient's first colonoscopy,                            Unexplained iron deficiency anemia Providers:                Ronnette Juniper, MD Referring MD:             Internal Medicine Teaching Service Medicines:                Monitored Anesthesia Care Complications:            No immediate complications. Estimated blood loss:                            Minimal. Estimated Blood Loss:     Estimated blood loss was minimal. Procedure:                Pre-Anesthesia Assessment:                           - Prior to the procedure, a History and Physical                            was performed, and patient medications and                            allergies were reviewed. The patient's tolerance of                            previous anesthesia was also reviewed. The risks                            and benefits of the procedure and the sedation                            options and risks were discussed with the patient.                            All questions were answered, and informed consent                            was obtained. Prior Anticoagulants: The patient has                            taken no previous anticoagulant or antiplatelet                            agents. ASA Grade Assessment: III - A patient with  severe systemic disease. After reviewing the risks                            and benefits, the patient was deemed in                            satisfactory condition to undergo the procedure.                           After obtaining informed consent, the colonoscope                            was passed under direct vision. Throughout the                            procedure,  the patient's blood pressure, pulse, and                            oxygen saturations were monitored continuously. The                            PCF-H190DL ([id number] Olympus pediatric colonoscope                            was introduced through the anus and advanced to the                            the cecum, identified by appendiceal orifice and                            ileocecal valve. The ileocecal valve, the                            appendiceal orifice and the rectum were                            photographed. The colonoscopy was technically                            difficult and complex due to a redundant colon,                            significant looping and a tortuous colon. The                            patient tolerated the procedure well. The quality                            of the bowel preparation was fair. Scope In: 10:22:22 AM Scope Out: 10:38:43 AM Scope Withdrawal Time: 0 hours 7 minutes 45 seconds  Total Procedure Duration: 0 hours 16 minutes 21 seconds  Findings:      The perianal and digital rectal examinations were normal.      Scattered small and large-mouthed diverticula were found in the  sigmoid       colon, descending colon, transverse colon and hepatic flexure.      A large amount of liquid semi-liquid stool was found in the transverse       colon, at the hepatic flexure, in the ascending colon, in the cecum and       at the appendiceal orifice, making visualization difficult. Lavage of       the area was performed, resulting in clearance with fair visualization.      Three sessile polyps were found in the transverse colon. The polyps were       3 to 5 mm in size. These polyps were removed with a piecemeal technique       using a cold biopsy forceps. Resection and retrieval were complete.      Mucosal friability was noted with self limited minimal oozing, likely       related to barotrauma from the scope.      The exam was otherwise without  abnormality on direct and retroflexion       views. Impression:               - Preparation of the colon was fair.                           - Diverticulosis in the sigmoid colon, in the                            descending colon, in the transverse colon and at                            the hepatic flexure.                           - Stool in the transverse colon, at the hepatic                            flexure, in the ascending colon, in the cecum and                            at the appendiceal orifice.                           - Three 3 to 5 mm polyps in the transverse colon,                            removed piecemeal using a cold biopsy forceps.                            Resected and retrieved.                           - The examination was otherwise normal on direct                            and retroflexion views. Moderate Sedation:      Patient did not receive moderate sedation for this procedure, but       instead received monitored anesthesia  care. Recommendation:           - Resume regular diet.                           - Continue present medications.                           - Await pathology results.                           - Repeat colonoscopy for surveillance based on                            pathology results. Procedure Code(s):        --- Professional ---                           [phone], Colonoscopy, flexible; with biopsy, single                            or multiple Diagnosis Code(s):        --- Professional ---                           K63.5, Polyp of colon                           D50.9, Iron deficiency anemia, unspecified                           K57.30, Diverticulosis of large intestine without                            perforation or abscess without bleeding CPT copyright 2019 [employer]. All rights reserved. The codes documented in this report are preliminary and upon coder review may  be revised to meet current  compliance requirements. Ronnette Juniper, MD 10/02/2019 10:53:38 AM This report has been signed electronically. Number of Addenda: 0

## 2019-10-02 NOTE — Evaluation (Signed)
Physical Therapy Evaluation Patient Details Name: Caitlin Cervantes MRN: [id number] DOB: [birth year] Today's Date: 10/02/2019   History of Present Illness  Pt is a 79 y/o female admitted secondary to anemia. Pt is s/p EGD and colonoscopy; polyps removed and lesions biopsied. PMH includes HTN and seizures.   Clinical Impression  Pt admitted secondary to problem above with deficits below. Pt requiring min A for mobility without AD and required min guard A with use of RW. Pt with some cognitive deficits noted, however, will likely require further assessment. Reports son lives with her and is taking time off to help at home. Will continue to follow acutely to maximize functional mobility independence and safety.     Follow Up Recommendations Home health PT;Supervision for mobility/OOB    Equipment Recommendations  Rolling walker with 5" wheels    Recommendations for Other Services       Precautions / Restrictions Precautions Precautions: Fall Restrictions Weight Bearing Restrictions: No      Mobility  Bed Mobility Overal bed mobility: Needs Assistance Bed Mobility: Supine to Sit     Supine to sit: Supervision     General bed mobility comments: Supervision for safety.   Transfers Overall transfer level: Needs assistance Equipment used: 1 person hand held assist Transfers: Sit to/from Stand Sit to Stand: Min assist         General transfer comment: Min A for steadying assist.   Ambulation/Gait Ambulation/Gait assistance: Min guard;Min assist Gait Distance (Feet): 125 Feet Assistive device: 1 person hand held assist;Rolling walker (2 wheeled) Gait Pattern/deviations: Step-through pattern;Decreased stride length Gait velocity: Decreased   General Gait Details: Waddle type gait with HHA. Pt also pushing through PTs had quite a bit for balance. Gave pt RW to use and noted improved balance. Educated about using RW at home to increase safety.   Stairs             Wheelchair Mobility    Modified Rankin (Stroke Patients Only)       Balance Overall balance assessment: Needs assistance Sitting-balance support: No upper extremity supported;Feet supported Sitting balance-Leahy Scale: Good     Standing balance support: Single extremity supported;Bilateral upper extremity supported;No upper extremity supported;During functional activity Standing balance-Leahy Scale: Fair Standing balance comment: Able to stand at sink to brush teeth without UE support                              Pertinent Vitals/Pain Pain Assessment: No/denies pain    Home Living Family/patient expects to be discharged to:: Private residence Living Arrangements: Children(son) Available Help at Discharge: Family;Available PRN/intermittently(Can provide 24/7 the rest of the week ) Type of Home: Apartment Home Access: Stairs to enter Entrance Stairs-Rails: Right Entrance Stairs-Number of Steps: flight Home Layout: One level Home Equipment: None      Prior Function Level of Independence: Independent         Comments: still drives, cooks, Development worker, international aid Domin[employer]        Extremity/Trunk Assessment   Upper Extremity Assessment Upper Extremity Assessment: Defer to OT evaluation    Lower Extremity Assessment Lower Extremity Assessment: Generalized weakness    Cervical / Trunk Assessment Cervical / Trunk Assessment: Normal  Communication   Communication: No difficulties  Cognition Arousal/Alertness: Awake/alert Behavior During Therapy: WFL for tasks assessed/performed Overall Cognitive Status: No family/caregiver present to determine baseline cognitive functioning  General Comments: Pt repeating things throughout session (MD was coming up, certain channels on the TV). Unsure of pt's baseline       General Comments      Exercises     Assessment/Plan    PT Assessment Patient needs continued  PT services  PT Problem List Decreased strength;Decreased balance;Decreased mobility;Decreased knowledge of use of DME;Decreased cognition       PT Treatment Interventions Gait training;Stair training;DME instruction;Functional mobility training;Therapeutic activities;Therapeutic exercise;Balance training;Patient/family education    PT Goals (Current goals can be found in the Care Plan section)  Acute Rehab PT Goals Patient Stated Goal: to go home PT Goal Formulation: With patient Time For Goal Achievement: 10/16/19 Potential to Achieve Goals: Good    Frequency Min 3X/week   Barriers to discharge        Co-evaluation PT/OT/SLP Co-Evaluation/Treatment: Yes Reason for Co-Treatment: To address functional/ADL transfers PT goals addressed during session: Mobility/safety with mobility;Balance;Proper use of DME         AM-PAC PT "6 Clicks" Mobility  Outcome Measure Help needed turning from your back to your side while in a flat bed without using bedrails?: None Help needed moving from lying on your back to sitting on the side of a flat bed without using bedrails?: None Help needed moving to and from a bed to a chair (including a wheelchair)?: A Little Help needed standing up from a chair using your arms (e.g., wheelchair or bedside chair)?: A Little Help needed to walk in hospital room?: A Little Help needed climbing 3-5 steps with a railing? : A Lot 6 Click Score: 19    End of Session Equipment Utilized During Treatment: Gait belt Activity Tolerance: Patient tolerated treatment well Patient left: in chair;with call bell/phone within reach Nurse Communication: Mobility status PT Visit Diagnosis: Muscle weakness (generalized) (M62.81);Unsteadiness on feet (R26.81)    Time: 6384-6659 PT Time Calculation (min) (ACUTE ONLY): 24 min   Charges:   PT Evaluation $PT Eval Low Complexity: 1 Low          [name], DPT  Acute Rehabilitation Services  Pager: [phone] Office: [phone]   [name] 10/02/2019, 1:36 PM

## 2019-10-02 NOTE — Op Note (Signed)
[facility] Patient Name: Caitlin Cervantes Procedure Date : 10/02/2019 MRN: [id number] Attending MD: Ronnette Juniper , MD Date of Birth: 05-14-1941 CSN: [id number] Age: 79 Admit Type: Inpatient Procedure:                Upper GI endoscopy Indications:              Unexplained iron deficiency anemia Providers:                Ronnette Juniper, MD Referring MD:             Internal Medicine Teaching service Medicines:                Monitored Anesthesia Care Complications:            No immediate complications. Estimated blood loss:                            Minimal. Estimated Blood Loss:     Estimated blood loss was minimal. Procedure:                Pre-Anesthesia Assessment:                           - Prior to the procedure, a History and Physical                            was performed, and patient medications and                            allergies were reviewed. The patient's tolerance of                            previous anesthesia was also reviewed. The risks                            and benefits of the procedure and the sedation                            options and risks were discussed with the patient.                            All questions were answered, and informed consent                            was obtained. Prior Anticoagulants: The patient has                            taken no previous anticoagulant or antiplatelet                            agents. ASA Grade Assessment: III - A patient with                            severe systemic disease. After reviewing the risks  and benefits, the patient was deemed in                            satisfactory condition to undergo the procedure.                           After obtaining informed consent, the endoscope was                            passed under direct vision. Throughout the                            procedure, the patient's blood pressure, pulse, and        oxygen saturations were monitored continuously. The                            [id number]-H190 ([id number]) Olympus gastroscope was                            introduced through the mouth, and advanced to the                            second part of duodenum. The upper GI endoscopy was                            accomplished without difficulty. The patient                            tolerated the procedure well. Scope In: Scope Out: Findings:      A widely patent [name] was found at the gastroesophageal junction.      A 6 cm hiatal hernia was present.      Many non-bleeding cratered and superficial gastric ulcers with pigmented       material were found in the cardia, in the gastric body, on the greater       curvature of the stomach, on the lesser curvature of the stomach, in the       gastric antrum and in the prepyloric region of the stomach. The largest       lesion was 8 mm in largest dimension. Biopsies were taken with a cold       forceps for histology.      Localized moderately erythematous mucosa without bleeding was found in       the gastric antrum. Biopsies were taken with a cold forceps for       Helicobacter pylori testing.      On retroflexion and forward views, [name] ulcers were noted at the edge       of hernia.      Few non-bleeding superficial duodenal ulcers with a clean ulcer base       (Forrest Class III) were found in the duodenal bulb and in the first       portion of the duodenum. The largest lesion was 5 mm in largest       dimension. Biopsies for histology were taken with a cold forceps for       evaluation of celiac disease. Impression:               -  Widely patent [name].                           - 6 cm hiatal hernia.                           - Non-bleeding gastric ulcers with pigmented                            material. Biopsied.                           - Erythematous mucosa in the antrum. Biopsied.                           -  Non-bleeding duodenal ulcers with a clean ulcer                            base (Forrest Class III). Biopsied. Moderate Sedation:      Patient did not receive moderate sedation for this procedure, but       instead received monitored anesthesia care. Recommendation:           - Use Protonix (pantoprazole) 40 mg PO BID for 2                            months, then 40 mg Daily indefinitely.                           It seems that patient has [name] ulcers with                            hiatal hernia and may benefit fro outpatient                            surgical evaluation for hiatal hernia repair.                           - Perform a colonoscopy today. Procedure Code(s):        --- Professional ---                           [phone], Esophagogastroduodenoscopy, flexible,                            transoral; with biopsy, single or multiple Diagnosis Code(s):        --- Professional ---                           K22.2, Esophageal obstruction                           K44.9, Diaphragmatic hernia without obstruction or                            gangrene  K25.9, Gastric ulcer, unspecified as acute or                            chronic, without hemorrhage or perforation                           K31.89, Other diseases of stomach and duodenum                           K26.9, Duodenal ulcer, unspecified as acute or                            chronic, without hemorrhage or perforation                           D50.9, Iron deficiency anemia, unspecified CPT copyright 2019 [employer]. All rights reserved. The codes documented in this report are preliminary and upon coder review may  be revised to meet current compliance requirements. Ronnette Juniper, MD 10/02/2019 10:49:17 AM This report has been signed electronically. Number of Addenda: 0

## 2019-10-02 NOTE — Op Note (Signed)
EGD and colonoscopy were performed for severe anemia.   Findings: Widely patent Schatzki's ring. 6 cm hiatal hernia. Multiple ulcers noted in the cardia, lesser curvature, greater curvature, body, antrum, mostly superficial, some were created with pigmented material, biopsies taken. Cameron ulcers noted at the edge of hiatal hernia. Biopsy taken from the antrum to rule out H. Pylori. Superficial ulcers noted in duodenal bulb and duodenum. Biopsies taken to rule out celiac disease.   Fair prep with large amount of liquid stool especially in the right colon. Fair visualization after lavage. 3 small transverse polyps, noted and removed. Diverticulosis in sigmoid, descending, transverse and hepatic flexure. Normal retroflexion.   Recommendations: Resume regular diet. Iron supplementation. PPI twice daily for 2 months, will likely needed once a day indefinitely thereafter. Recommend surgical evaluation for hiatal hernia repair, can be performed as an outpatient.  Otherwise from GI standpoint, okay to be Page home. Please recall GI if needed.  Ronnette Juniper, MD

## 2019-10-03 ENCOUNTER — Telehealth: Payer: Self-pay | Admitting: Student in an Organized Health Care Education/Training Program

## 2019-10-03 ENCOUNTER — Encounter: Payer: Self-pay | Admitting: *Deleted

## 2019-10-03 LAB — SURGICAL PATHOLOGY

## 2019-10-03 NOTE — Telephone Encounter (Signed)
Pt is calling colonscopy; pls contact [phone] pt or son

## 2019-10-06 ENCOUNTER — Observation Stay (HOSPITAL_COMMUNITY): Payer: Medicare Other

## 2019-10-06 ENCOUNTER — Ambulatory Visit (INDEPENDENT_AMBULATORY_CARE_PROVIDER_SITE_OTHER): Payer: Medicare Other | Admitting: Internal Medicine

## 2019-10-06 ENCOUNTER — Inpatient Hospital Stay (HOSPITAL_COMMUNITY)
Admission: AD | Admit: 2019-10-06 | Discharge: 2019-10-13 | DRG: 554 | Disposition: A | Discharge status: Skilled Nursing Facility | Payer: Medicare Other | Source: Ambulatory Visit | Attending: Internal Medicine | Admitting: Internal Medicine

## 2019-10-06 ENCOUNTER — Encounter: Payer: Self-pay | Admitting: Internal Medicine

## 2019-10-06 VITALS — BP 134/68 | HR 100 | Temp 99.2°F | Resp 16 | Ht 61.0 in

## 2019-10-06 DIAGNOSIS — M79672 Pain in left foot: Secondary | ICD-10-CM

## 2019-10-06 DIAGNOSIS — M25561 Pain in right knee: Secondary | ICD-10-CM

## 2019-10-06 DIAGNOSIS — Z8249 Family history of ischemic heart disease and other diseases of the circulatory system: Secondary | ICD-10-CM

## 2019-10-06 DIAGNOSIS — Z20822 Contact with and (suspected) exposure to covid-19: Secondary | ICD-10-CM | POA: Diagnosis present

## 2019-10-06 DIAGNOSIS — N179 Acute kidney failure, unspecified: Secondary | ICD-10-CM | POA: Diagnosis not present

## 2019-10-06 DIAGNOSIS — Z806 Family history of leukemia: Secondary | ICD-10-CM

## 2019-10-06 DIAGNOSIS — I129 Hypertensive chronic kidney disease with stage 1 through stage 4 chronic kidney disease, or unspecified chronic kidney disease: Secondary | ICD-10-CM | POA: Diagnosis present

## 2019-10-06 DIAGNOSIS — K449 Diaphragmatic hernia without obstruction or gangrene: Secondary | ICD-10-CM | POA: Diagnosis not present

## 2019-10-06 DIAGNOSIS — F0390 Unspecified dementia without behavioral disturbance: Secondary | ICD-10-CM

## 2019-10-06 DIAGNOSIS — D500 Iron deficiency anemia secondary to blood loss (chronic): Secondary | ICD-10-CM | POA: Diagnosis not present

## 2019-10-06 DIAGNOSIS — Z902 Acquired absence of lung [part of]: Secondary | ICD-10-CM

## 2019-10-06 DIAGNOSIS — M79671 Pain in right foot: Secondary | ICD-10-CM

## 2019-10-06 DIAGNOSIS — E876 Hypokalemia: Secondary | ICD-10-CM | POA: Diagnosis present

## 2019-10-06 DIAGNOSIS — S92512A Displaced fracture of proximal phalanx of left lesser toe(s), initial encounter for closed fracture: Secondary | ICD-10-CM | POA: Diagnosis not present

## 2019-10-06 DIAGNOSIS — D649 Anemia, unspecified: Secondary | ICD-10-CM

## 2019-10-06 DIAGNOSIS — N1831 Chronic kidney disease, stage 3a: Secondary | ICD-10-CM | POA: Diagnosis not present

## 2019-10-06 DIAGNOSIS — K259 Gastric ulcer, unspecified as acute or chronic, without hemorrhage or perforation: Secondary | ICD-10-CM | POA: Diagnosis not present

## 2019-10-06 DIAGNOSIS — N1832 Chronic kidney disease, stage 3b: Secondary | ICD-10-CM | POA: Diagnosis present

## 2019-10-06 DIAGNOSIS — M19071 Primary osteoarthritis, right ankle and foot: Secondary | ICD-10-CM | POA: Diagnosis not present

## 2019-10-06 DIAGNOSIS — K257 Chronic gastric ulcer without hemorrhage or perforation: Secondary | ICD-10-CM | POA: Diagnosis present

## 2019-10-06 DIAGNOSIS — M009 Pyogenic arthritis, unspecified: Secondary | ICD-10-CM | POA: Diagnosis present

## 2019-10-06 DIAGNOSIS — R609 Edema, unspecified: Secondary | ICD-10-CM | POA: Diagnosis not present

## 2019-10-06 DIAGNOSIS — M11261 Other chondrocalcinosis, right knee: Secondary | ICD-10-CM | POA: Diagnosis present

## 2019-10-06 DIAGNOSIS — Z8611 Personal history of tuberculosis: Secondary | ICD-10-CM

## 2019-10-06 DIAGNOSIS — K269 Duodenal ulcer, unspecified as acute or chronic, without hemorrhage or perforation: Secondary | ICD-10-CM | POA: Diagnosis not present

## 2019-10-06 DIAGNOSIS — R0989 Other specified symptoms and signs involving the circulatory and respiratory systems: Secondary | ICD-10-CM

## 2019-10-06 DIAGNOSIS — M1129 Other chondrocalcinosis, multiple sites: Principal | ICD-10-CM | POA: Diagnosis present

## 2019-10-06 DIAGNOSIS — I10 Essential (primary) hypertension: Secondary | ICD-10-CM | POA: Diagnosis present

## 2019-10-06 DIAGNOSIS — M79673 Pain in unspecified foot: Secondary | ICD-10-CM | POA: Insufficient documentation

## 2019-10-06 DIAGNOSIS — L8990 Pressure ulcer of unspecified site, unspecified stage: Secondary | ICD-10-CM | POA: Insufficient documentation

## 2019-10-06 DIAGNOSIS — M130 Polyarthritis, unspecified: Secondary | ICD-10-CM | POA: Diagnosis present

## 2019-10-06 DIAGNOSIS — Z743 Need for continuous supervision: Secondary | ICD-10-CM | POA: Diagnosis not present

## 2019-10-06 DIAGNOSIS — M25569 Pain in unspecified knee: Secondary | ICD-10-CM | POA: Insufficient documentation

## 2019-10-06 DIAGNOSIS — R0902 Hypoxemia: Secondary | ICD-10-CM | POA: Diagnosis not present

## 2019-10-06 DIAGNOSIS — L89152 Pressure ulcer of sacral region, stage 2: Secondary | ICD-10-CM | POA: Diagnosis not present

## 2019-10-06 DIAGNOSIS — R52 Pain, unspecified: Secondary | ICD-10-CM | POA: Diagnosis not present

## 2019-10-06 DIAGNOSIS — R279 Unspecified lack of coordination: Secondary | ICD-10-CM | POA: Diagnosis not present

## 2019-10-06 LAB — CBC WITH DIFFERENTIAL/PLATELET
Abs Immature Granulocytes: 0 10*3/uL (ref 0.00–0.07)
Basophils Absolute: 0 10*3/uL (ref 0.0–0.1)
Basophils Relative: 0 %
Eosinophils Absolute: 0 10*3/uL (ref 0.0–0.5)
Eosinophils Relative: 0 %
HCT: 36.9 % (ref 36.0–46.0)
Hemoglobin: 12 g/dL (ref 12.0–15.0)
Lymphocytes Relative: 4 %
Lymphs Abs: 1.2 10*3/uL (ref 0.7–4.0)
MCH: 27.8 pg (ref 26.0–34.0)
MCHC: 32.5 g/dL (ref 30.0–36.0)
MCV: 85.4 fL (ref 80.0–100.0)
Monocytes Absolute: 1.5 10*3/uL — ABNORMAL HIGH (ref 0.1–1.0)
Monocytes Relative: 5 %
Neutro Abs: 27.6 10*3/uL — ABNORMAL HIGH (ref 1.7–7.7)
Neutrophils Relative %: 91 %
Platelets: 505 10*3/uL — ABNORMAL HIGH (ref 150–400)
RBC: 4.32 MIL/uL (ref 3.87–5.11)
RDW: 18.6 % — ABNORMAL HIGH (ref 11.5–15.5)
WBC: 30.3 10*3/uL — ABNORMAL HIGH (ref 4.0–10.5)
nRBC: 0 % (ref 0.0–0.2)
nRBC: 0 /100 WBC

## 2019-10-06 LAB — SYNOVIAL CELL COUNT + DIFF, W/ CRYSTALS
Eosinophils-Synovial: 0 % (ref 0–1)
Lymphocytes-Synovial Fld: 1 % (ref 0–20)
Monocyte-Macrophage-Synovial Fluid: 14 % — ABNORMAL LOW (ref 50–90)
Neutrophil, Synovial: 85 % — ABNORMAL HIGH (ref 0–25)
WBC, Synovial: 36000 /mm3 — ABNORMAL HIGH (ref 0–200)

## 2019-10-06 LAB — BASIC METABOLIC PANEL
Anion gap: 16 — ABNORMAL HIGH (ref 5–15)
BUN: 22 mg/dL (ref 8–23)
CO2: 25 mmol/L (ref 22–32)
Calcium: 9.5 mg/dL (ref 8.9–10.3)
Chloride: 95 mmol/L — ABNORMAL LOW (ref 98–111)
Creatinine, Ser: 1.41 mg/dL — ABNORMAL HIGH (ref 0.44–1.00)
GFR calc Af Amer: 42 mL/min — ABNORMAL LOW (ref >60)
GFR calc non Af Amer: 36 mL/min — ABNORMAL LOW (ref >60)
Glucose, Bld: 128 mg/dL — ABNORMAL HIGH (ref 70–99)
Potassium: 3.6 mmol/L (ref 3.5–5.1)
Sodium: 136 mmol/L (ref 135–145)

## 2019-10-06 MED ORDER — DONEPEZIL HCL 5 MG PO TABS: 5.0000 mg | ORAL_TABLET | Freq: Every day | ORAL | 2 refills | Status: AC

## 2019-10-06 MED ORDER — DONEPEZIL HCL 5 MG PO TABS
5.0000 mg | ORAL_TABLET | Freq: Every day | ORAL | Status: DC
  Administered 2019-10-06 – 2019-10-12 (×7): 5 mg via ORAL
  Filled 2019-10-06 (×7): qty 1

## 2019-10-06 MED ORDER — ACETAMINOPHEN 325 MG PO TABS
650.0000 mg | ORAL_TABLET | Freq: Four times a day (QID) | ORAL | Status: DC | PRN
  Administered 2019-10-06 – 2019-10-10 (×5): 650 mg via ORAL
  Filled 2019-10-06 (×5): qty 2

## 2019-10-06 MED ORDER — ATORVASTATIN CALCIUM 40 MG PO TABS
40.0000 mg | ORAL_TABLET | Freq: Every day | ORAL | Status: DC
  Administered 2019-10-07 – 2019-10-13 (×7): 40 mg via ORAL
  Filled 2019-10-06 (×7): qty 1

## 2019-10-06 MED ORDER — PANTOPRAZOLE SODIUM 40 MG PO TBEC
40.0000 mg | DELAYED_RELEASE_TABLET | Freq: Two times a day (BID) | ORAL | Status: DC
  Administered 2019-10-07 – 2019-10-13 (×14): 40 mg via ORAL
  Filled 2019-10-06 (×14): qty 1

## 2019-10-06 MED ORDER — ENOXAPARIN SODIUM 30 MG/0.3ML ~~LOC~~ SOLN: 30.0000 mg | SUBCUTANEOUS | Status: DC

## 2019-10-06 MED ORDER — SENNOSIDES-DOCUSATE SODIUM 8.6-50 MG PO TABS: 1.0000 | ORAL_TABLET | Freq: Every evening | ORAL | Status: DC | PRN

## 2019-10-06 MED ORDER — VANCOMYCIN HCL 1250 MG/250ML IV SOLN
1250.0000 mg | Freq: Once | INTRAVENOUS | Status: AC
  Administered 2019-10-06: 1250 mg via INTRAVENOUS
  Filled 2019-10-06: qty 250

## 2019-10-06 MED ORDER — VANCOMYCIN HCL IN DEXTROSE 1-5 GM/200ML-% IV SOLN
1000.0000 mg | INTRAVENOUS | Status: DC
  Administered 2019-10-08: 1000 mg via INTRAVENOUS
  Filled 2019-10-06: qty 200

## 2019-10-06 MED ORDER — ASPIRIN EC 81 MG PO TBEC
81.0000 mg | DELAYED_RELEASE_TABLET | Freq: Every day | ORAL | Status: DC
  Filled 2019-10-06: qty 1

## 2019-10-06 MED ORDER — LEVETIRACETAM 500 MG PO TABS
500.0000 mg | ORAL_TABLET | Freq: Two times a day (BID) | ORAL | Status: DC
  Administered 2019-10-06 – 2019-10-13 (×14): 500 mg via ORAL
  Filled 2019-10-06 (×14): qty 1

## 2019-10-06 MED ORDER — ASPIRIN EC 81 MG PO TBEC: 81.0000 mg | DELAYED_RELEASE_TABLET | Freq: Every day | ORAL | Status: DC

## 2019-10-06 MED ORDER — ACETAMINOPHEN 650 MG RE SUPP: 650.0000 mg | Freq: Four times a day (QID) | RECTAL | Status: DC | PRN

## 2019-10-06 NOTE — Assessment & Plan Note (Addendum)
Patient noted to have hiatal hernia on Endoscopy during recent admission with cameron lesions (ulcers). She may need to be evaluated by general surgery pending stabilization from recent bleed and current evaluation for septic arthritis.  - Defer referral to general surgery for now

## 2019-10-06 NOTE — Assessment & Plan Note (Addendum)
Patient and her son note she has had ongoing issues with her memory. While she was admitted a [place] was done and she scored 18/30. CT and MRI in 2016 Showed "Generalized atrophy. Moderately advanced chronic microvascular ischemic change." A degree of Alzheimer's Dementia is suspected. Will start patient on Aricept to slow progression. - Aricept 5mg  Daily

## 2019-10-06 NOTE — Plan of Care (Signed)

## 2019-10-06 NOTE — Assessment & Plan Note (Addendum)
Patient recently admitted with A/C CKD III. Cr improved from 2.4 to 1.15 on discharge. Will recheck today to confirm stable renal function. - BMP

## 2019-10-06 NOTE — Progress Notes (Signed)
Notified Dr Madilyn Fireman of pt T101.7, given prn Tylenol. Pt is NPO, should she continue with NPO status? RN will continue to monitor

## 2019-10-06 NOTE — Assessment & Plan Note (Addendum)
Patient recently admit for sever anemia with Hgb of 3.9. Evaluation showed iron deficiency, ulcers of her stomach and duodenum (suspected source), and some diverticula. She received 3U pRBCs and discharge Hgb was 10.3. She has been started on PPI and has follow up with GI. - Continue PPI - Continue Iron supplement - CBC

## 2019-10-06 NOTE — Assessment & Plan Note (Addendum)
Patient recently admit for sever anemia with Hgb of 3.9. Evaluation showed iron deficiency, ulcers of her stomach and duodenum (suspected source), and some diverticula. She has been started on PPI and has follow up with GI. - Continue PPI - Continue Iron supplement - CBC

## 2019-10-06 NOTE — H&P (Addendum)
NAME:  Caitlin Cervantes, MRN:  [id number], DOB:  [birth year], LOS: 0 ADMISSION DATE:  10/06/2019, Primary: Axel Filler, MD  CHIEF COMPLAINT:  Right knee pain  Medical Service: Internal Medicine Teaching Service         Attending Physician: Dr. Lucious Groves, DO    First Contact: Dr. Darrick Meigs Pager: [phone]  Second Contact: Dr. Sharon Seller Pager: [phone]       After Hours (After 5p/  First Contact Pager: [phone]  weekends / holidays): Second Contact Pager: [phone]    History of present illness   Caitlin Cervantes is a 79 yo female who was recently hospitalized 3/9-3/11 for severe anemia (presenting hgb 3.9; discharge hgb 10.9). During that hospitalization, she underwent colonoscopy and EGD which reveal several gastric ulcers. Prior to discharge, she was feeling well and had no complaints. Her son notes that she has been seeming much less fatigued at home and has been more energetic. Today, she and her son note that the day following discharge, she developed right foot pain which progressed to her right knee. Painful with passive ROM.  She denies fevers, chills, n/v at home.  Denies recent injury to the knee. Denies history of blood clots. Denies shortness of breath today.  She presented to the clinic today this right knee pain and was found to have low grade fever of 100.78F. CBC indicates a white count of 30 and arthrocentesis fluid significant for PMNs and calcium pyrophosphate crystals.   Past Medical History  She,  has a past medical history of Anemia (0/02/8109), Diastolic dysfunction (10/05/9456), Essential hypertension (05/22/2015), History of active tuberculosis (07/25/1947), Hyperlipidemia (05/23/2015), Idiopathic scoliosis of thoracolumbar spine (12/08/2016), Osteoarthritis of right lower extremity (12/08/2016), Overweight (BMI 25.0-29.9) (12/08/2016), Seasonal allergic rhinitis due to pollen (12/08/2016), Seizures ([place]), and Stage III chronic kidney disease (05/22/2018).   Home  Medications     Prior to Admission medications   Medication Sig Start Date End Date Taking? Authorizing Provider  amLODipine (NORVASC) 2.5 MG tablet TAKE ONE TABLET BY MOUTH DAILY Patient taking differently: Take 2.5 mg by mouth daily.  08/12/19   Axel Filler, MD  aspirin EC 81 MG tablet Take 81 mg by mouth at bedtime.    [provider]  atorvastatin (LIPITOR) 40 MG tablet TAKE 1 TABLET (40 MG TOTAL) BY MOUTH DAILY AT 6 PM. 08/12/19   Axel Filler, MD  donepezil (ARICEPT) 5 MG tablet Take 1 tablet (5 mg total) by mouth at bedtime. 10/06/19 10/05/20  Neva Seat, MD  hydrochlorothiazide (HYDRODIURIL) 25 MG tablet TAKE ONE TABLET BY MOUTH DAILY Patient taking differently: Take 25 mg by mouth daily.  08/12/19   Axel Filler, MD  iron polysaccharides (NU-IRON) 150 MG capsule Take 1 capsule (150 mg total) by mouth daily. 10/02/19   Mitzi Hansen, MD  levETIRAcetam (KEPPRA) 500 MG tablet TAKE ONE TABLET BY MOUTH TWICE A DAY Patient taking differently: Take 500 mg by mouth 2 (two) times daily.  08/12/19   Axel Filler, MD  lisinopril (ZESTRIL) 40 MG tablet TAKE ONE TABLET BY MOUTH DAILY Patient taking differently: Take 40 mg by mouth daily.  08/12/19   Axel Filler, MD  pantoprazole (PROTONIX) 40 MG tablet Take 1 tablet (40 mg total) by mouth 2 (two) times daily before a meal. 10/02/19 12/01/19  Mitzi Hansen, MD    Allergies    Allergies as of 10/06/2019  . (No Known Allergies)    Social History   reports that she  has never smoked. She has never used smokeless tobacco. She reports that she does not drink alcohol or use drugs.   Family History   Her family history includes Healthy in her son; Hernia in her sister; Hypertension in her sister; Lactose intolerance in her sister; Leukemia (age of onset: 60) in her mother; Obstructive Sleep Apnea in her sister; Osteoarthritis in her sister. There is no history of Seizures.    ROS    Review of Systems  Constitutional: Negative for chills, fever and malaise/fatigue.  HENT: Negative.   Respiratory: Negative.  Negative for cough and shortness of breath.   Cardiovascular: Positive for leg swelling. Negative for chest pain.  Gastrointestinal: Negative for abdominal pain, constipation, diarrhea, nausea and vomiting.  Genitourinary: Negative.   Musculoskeletal: Positive for joint pain.  Skin: Negative.   Neurological: Negative.   Psychiatric/Behavioral: Negative.     Objective   Blood pressure 116/63, pulse 97, temperature (!) 100.7 F (38.2 C), temperature source Oral, resp. rate 18, SpO2 100 %.      Examination: GENERAL: in no acute distress HEENT: head atraumatic. No conjunctival injection. Nares patent.  CARDIAC: tachycardic PULMONARY: acyanotic. Lung sounds clear to auscultation. ABDOMEN: soft. Nontender to palpation.  Nondistended.  NEURO: CN II-XII grossly intact. SKIN: no rash or lesions on limited exam  MSK: pain to palpation of the right knee and the dorsal aspect of the left foot. No erythema or effusion of the right knee however exam occurred after joint aspiration. Pain with passive ROM of the right knee. Remainder of exam unremarkable.  Significant Diagnostic Tests:    Labs    CBC Latest Ref Rng & Units 10/06/2019 10/02/2019 10/01/2019  WBC 4.0 - 10.5 K/uL 30.3(H) 15.1(H) -  Hemoglobin 12.0 - 15.0 g/dL 12.0 10.3(L) 10.9(L)  Hematocrit 36.0 - 46.0 % 36.9 30.5(L) 32.2(L)  Platelets 150 - 400 K/uL 505(H) 270 -   BMP Latest Ref Rng & Units 10/06/2019 10/02/2019 10/01/2019  Glucose 70 - 99 mg/dL 128(H) 86 108(H)  BUN 8 - 23 mg/dL 22 14 22   Creatinine 0.44 - 1.00 mg/dL 1.41(H) 1.15(H) 1.40(H)  BUN/Creat Ratio 12 - 28 - - -  Sodium 135 - 145 mmol/L 136 140 142  Potassium 3.5 - 5.1 mmol/L 3.6 3.7 3.9  Chloride 98 - 111 mmol/L 95(L) 107 111  CO2 22 - 32 mmol/L 25 19(L) 19(L)  Calcium 8.9 - 10.3 mg/dL 9.5 8.9 9.4     Summary  Caitlin Cervantes is a 79  yo female who was admitted to [facility] on 3/15 for right knee pain suspected to be septic arthritis vs pseudogout.  Assessment & Plan:  Active Problems:   Septic arthritis (HCC)  Right knee pain Septic arthritis vs pseudogout. Gout r/o with calcium pyrophosphate crystals. White count 36k. Elevated PMNs non-specific. Gram stain negative. Cultures are pending. Presence of fever would suggest infectious source however would expect synovial fluid white count to be >50k although not completely necessary for diagnosis. I would not expect a fever in pseudogout however it's low grade and may be reactive.  Plan Consult ortho Vancomycin Follow cultures Tylenol for fever Blood cultures  CKD III. Will need to renally dose vancomycin. Avoid other nephrotoxins  Essential hypertension. Will hold hope antihypertensives as clinic blood pressures were trending down over the few hours she was there this afternoon.  Best practice:  CODE STATUS: FULL Diet: diabetic Pain management: tylenol GI prophylaxis: DVT for prophylaxis: scds Dispo: Admit patient to Inpatient with expected length of stay  greater than 2 midnights.   Mitzi Hansen, MD INTERNAL MEDICINE RESIDENT PGY-1 Pager # [phone] 10/06/19  6:14 PM

## 2019-10-06 NOTE — Assessment & Plan Note (Addendum)
Patient reports pain and swelling in her right foot since arriving home 3 days ago (after discharge from hospital for anemia). She had pain in her right foot first and now has some pain in her left foot.  On exam she is noted to have fever of 100.9. Her right foot is mildly edematous with a pocket of fatty tissue with some fluid at her lateral ankle (identified on ultrasound). She has tenderness of her MTPs when pressure is applied. Her Left foot is normal in appearance but will also have MTP pain with pressure.  She reports some right knee pain as well. Her knee is also swollen, warm, and there is trace erythema. We will evaluate the fluid in her knee for possible gout versus septic joint. No history of gout. She did have leukocytosis on discharge of 15.2, which was though to be reactive as patient was afebrile with no left shift on admit labs. - On aspiration synovial fluid noted to be cloudy so no steroid injection was performed as this may indicate a septic joint - Synovial Cell ct, Crystal Analysis, Culture - CBC  ADDENDUM: Preliminary CBC data shows WBC of 30. This combined with her fever and cloudy synovial fluid is concerning for septic arthritis. She is unsafe to send home. She will need to be admitted for further evaluation pending synovial fluid results. She will need IV antibiotics and Orthopedic consult if septic joint is confirmed. - Place in Observation, Med-Surg - Inpatient team paged and patient signed out as bed is available, but needs to be cleaned. - If patient does not get a bed before the clinic closes she will need to be transported to the ED for admission

## 2019-10-06 NOTE — Assessment & Plan Note (Addendum)
Patient recently admitted with A/C CKD III. Cr improved from 2.4 to 1.15 and eGFR 54 on discharge. Will recheck today to confirm stable renal function.  - BMP

## 2019-10-06 NOTE — Consult Note (Signed)
ORTHOPAEDIC CONSULTATION  REQUESTING PHYSICIAN: Lucious Groves, DO  Chief Complaint: "My ankles and knee hurts"  HPI: Caitlin Cervantes is a 79 y.o. female who presents with bilateral ankle and right knee pain.  She notes that pain has been ongoing over the last 2 weeks.  She notes that pain began in her ankles and recently began in her right knee as well. She denies feeling any subjective fever, chills, malaise, night sweats.  Denies any similar episodes or history of gout.  She was seen by internal medicine who aspirated her right knee prior to abx initiation. She was admitted and a cell count and culture was obtained from the aspirate fluid.   Past Medical History:  Diagnosis Date  . Anemia 09/30/2019  . Diastolic dysfunction 6/59/9357   Grade I  . Essential hypertension 05/22/2015  . History of active tuberculosis 07/25/1947   s/p left lower lobectomy at age 15 and two years in a local [facility]  . Hyperlipidemia 05/23/2015  . Idiopathic scoliosis of thoracolumbar spine 12/08/2016  . Osteoarthritis of right lower extremity 12/08/2016  . Overweight (BMI 25.0-29.9) 12/08/2016  . Seasonal allergic rhinitis due to pollen 12/08/2016  . Seizures ([place])    Possible seizure disorder marked by stareing events.  . Stage III chronic kidney disease 05/22/2018   Past Surgical History:  Procedure Laterality Date  . BIOPSY  10/02/2019   Procedure: BIOPSY;  Surgeon: Ronnette Juniper, MD;  Location: [facility] ENDOSCOPY;  Service: Gastroenterology;;  . COLONOSCOPY WITH PROPOFOL N/A 10/02/2019   Procedure: COLONOSCOPY WITH PROPOFOL;  Surgeon: Ronnette Juniper, MD;  Location: [place];  Service: Gastroenterology;  Laterality: N/A;  . ESOPHAGOGASTRODUODENOSCOPY (EGD) WITH PROPOFOL N/A 10/02/2019   Procedure: ESOPHAGOGASTRODUODENOSCOPY (EGD) WITH PROPOFOL;  Surgeon: Ronnette Juniper, MD;  Location: [place];  Service: Gastroenterology;  Laterality: N/A;  . LUNG LOBECTOMY Left    TB  . POLYPECTOMY  10/02/2019    Procedure: POLYPECTOMY;  Surgeon: Ronnette Juniper, MD;  Location: [place];  Service: Gastroenterology;;  . TONSILLECTOMY    . TUBAL LIGATION Bilateral    Social History   Socioeconomic History  . Marital status: Single    Spouse name: Not on file  . Number of children: 1  . Years of education: HS  . Highest education level: Not on file  Occupational History  . Occupation: Retired    Comment: Worked in Scientist, research (medical) and office work  Tobacco Use  . Smoking status: Never Smoker  . Smokeless tobacco: Never Used  Substance and Sexual Activity  . Alcohol use: No    Alcohol/week: 0.0 standard drinks  . Drug use: No  . Sexual activity: Never    Birth control/protection: None  Other Topics Concern  . Not on file  Social History Narrative   Lives with her son in an apartment (2nd floor unit) in [place].  Never married.  No pets currently. She is right handed.    Social Determinants of Health   Financial Resource Strain:   . Difficulty of Paying Living Expenses:   Food Insecurity:   . Worried About Charity fundraiser in the Last Year:   . Arboriculturist in the Last Year:   Transportation Needs:   . Film/video editor (Medical):   Marland Kitchen Lack of Transportation (Non-Medical):   Physical Activity:   . Days of Exercise per Week:   . Minutes of Exercise per Session:   Stress:   . Feeling of Stress :   Social Connections:   .  Frequency of Communication with Friends and Family:   . Frequency of Social Gatherings with Friends and Family:   . Attends Religious Services:   . Active Member of Clubs or Organizations:   . Attends Archivist Meetings:   Marland Kitchen Marital Status:    Family History  Problem Relation Age of Onset  . Leukemia Mother 19  . Hypertension Sister   . Obstructive Sleep Apnea Sister   . Osteoarthritis Sister   . Hernia Sister        Umbilical  . Lactose intolerance Sister   . Healthy Son   . Seizures Neg Hx    - negative except otherwise stated in the  family history section No Known Allergies Prior to Admission medications   Medication Sig Start Date End Date Taking? Authorizing Provider  amLODipine (NORVASC) 2.5 MG tablet TAKE ONE TABLET BY MOUTH DAILY Patient taking differently: Take 2.5 mg by mouth daily.  08/12/19   Axel Filler, MD  aspirin EC 81 MG tablet Take 81 mg by mouth at bedtime.    [provider]  atorvastatin (LIPITOR) 40 MG tablet TAKE 1 TABLET (40 MG TOTAL) BY MOUTH DAILY AT 6 PM. 08/12/19   Axel Filler, MD  donepezil (ARICEPT) 5 MG tablet Take 1 tablet (5 mg total) by mouth at bedtime. 10/06/19 10/05/20  Neva Seat, MD  hydrochlorothiazide (HYDRODIURIL) 25 MG tablet TAKE ONE TABLET BY MOUTH DAILY Patient taking differently: Take 25 mg by mouth daily.  08/12/19   Axel Filler, MD  iron polysaccharides (NU-IRON) 150 MG capsule Take 1 capsule (150 mg total) by mouth daily. 10/02/19   Mitzi Hansen, MD  levETIRAcetam (KEPPRA) 500 MG tablet TAKE ONE TABLET BY MOUTH TWICE A DAY Patient taking differently: Take 500 mg by mouth 2 (two) times daily.  08/12/19   Axel Filler, MD  lisinopril (ZESTRIL) 40 MG tablet TAKE ONE TABLET BY MOUTH DAILY Patient taking differently: Take 40 mg by mouth daily.  08/12/19   Axel Filler, MD  pantoprazole (PROTONIX) 40 MG tablet Take 1 tablet (40 mg total) by mouth 2 (two) times daily before a meal. 10/02/19 12/01/19  Mitzi Hansen, MD   DG Knee Complete 4 Views Right  Result Date: 10/06/2019 CLINICAL DATA:  Right knee pain for 1 day EXAM: RIGHT KNEE - COMPLETE 4+ VIEW COMPARISON:  None. FINDINGS: No fracture or malalignment. Mild patellofemoral, medial and lateral joint space degenerative changes. Joint space calcifications. Generalized soft tissue swelling with knee effusion. Vascular calcifications. IMPRESSION: 1. No acute osseous abnormality 2. Generalized soft tissue swelling with knee effusion. Tricompartment arthritis of the knee  Electronically Signed   By: Donavan Foil M.D.   On: 10/06/2019 22:31   DG Foot Complete Left  Result Date: 10/06/2019 CLINICAL DATA:  Foot pain EXAM: LEFT FOOT - COMPLETE 3+ VIEW COMPARISON:  None. FINDINGS: Acute appearing mildly impacted fracture at the head of the fifth proximal phalanx. No subluxation. Hallux valgus deformity at the first MTP joint with degenerative changes. No soft tissue emphysema or radiopaque foreign body. IMPRESSION: Acute appearing slightly impacted fracture at the head of the fifth proximal phalanx Electronically Signed   By: Donavan Foil M.D.   On: 10/06/2019 22:33   DG Foot Complete Right  Result Date: 10/06/2019 CLINICAL DATA:  Foot pain EXAM: RIGHT FOOT COMPLETE - 3+ VIEW COMPARISON:  None. FINDINGS: No acute displaced fracture or malalignment. Degenerative changes at the first MTP joint. Small plantar calcaneal spur. IMPRESSION: No acute  osseous abnormality.  Arthritis at the first MTP joint Electronically Signed   By: Donavan Foil M.D.   On: 10/06/2019 22:34   - pertinent xrays, CT, MRI studies were reviewed and independently interpreted  Positive ROS: All other systems have been reviewed and were otherwise negative with the exception of those mentioned in the HPI and as above.  Physical Exam: General: Alert, no acute distress Psychiatric: Patient is competent for consent with normal mood and affect Lymphatic: No axillary or cervical lymphadenopathy Cardiovascular: No pedal edema Respiratory: No cyanosis, no use of accessory musculature GI: No organomegaly, abdomen is soft and non-tender    Images:  @ENCIMAGES @  Labs:  Lab Results  Component Value Date   HGBA1C 6.1 (A) 05/05/2019   HGBA1C 6.1 (H) 05/22/2015   HGBA1C  03/02/2008    6.0 (NOTE)   The ADA recommends the following therapeutic goal for glycemic   control related to Hgb A1C measurement:   Goal of Therapy:   < 7.0% Hgb A1C   Reference: American Diabetes Association: Clinical Practice    Recommendations 2008, Diabetes Care,  2008, 31:(Suppl 1).   REPTSTATUS PENDING 10/06/2019   GRAMSTAIN  10/06/2019    ABUNDANT WBC PRESENT, PREDOMINANTLY PMN NO ORGANISMS SEEN Performed at [facility], [place] [address], [place], [place] [address]    CULT PENDING 10/06/2019    Lab Results  Component Value Date   ALBUMIN 3.6 10/01/2019   ALBUMIN 3.6 09/30/2019   ALBUMIN 4.4 05/17/2018    Neurologic: Patient does not have protective sensation bilateral lower extremities.   MUSCULOSKELETAL:   Mild pain with passive ROM of the right knee.  Trace effusion of the right knee.  Bandaid in place from previous aspiration today.  No significant TTP throughout the right knee.  Mild TTP over the bilateral tibiotalar joints. Moderate TTP with erythema over the bilateral 1st MTP joints.   Assessment: Pseudogout vs. Septic arthritis  Plan: Cell count with 36,000 WBC and 85% neutrophils.  Positive for CPP crystals.  Impression at this time is pseudogout based on these results but will continue to follow patient's culture results throughout her hospital stay.  Case discussed with Dr. Alphonzo Severance. Could consider cortisone injection for persistent symptoms if culture results are negative.   Thank you for the consult and the opportunity to see Caitlin Cervantes, Caitlin Cervantes [phone] 10:54 PM

## 2019-10-06 NOTE — Telephone Encounter (Signed)
I spoke with patient and her son, Caitlin Cervantes, today. Patient recently admitted for acute anemia due to bleeding gastric ulcers. She reports no more fatigue and no more dark stools. Caitlin Cervantes tells me she was discharged to home on Thursday, started to have right ankle pain on Friday which progressed through the weekend. Son reports the right foot and ankle are swollen and very painful. Patient is unable to walk, cannot bear any weight on the right foot, son is helping her with all the ADLs right now. She had PT and OT evaluations in the hospital which recommended minimal assist. I recommended she come to our clinic for an evaluation today. We will want to evaluate for DVT, maybe this is lower extremity edema from the fluids and blood she got in the hospital, maybe she fell and does not remember and has a fracture, or possible acute gout from the stress of the hospitalization. Todd cannot carry her down the steps to the car and she cannot walk right now. We have called [facility] to try to arrange for ambulance transportation to our clinic as soon as possible.

## 2019-10-06 NOTE — Progress Notes (Signed)
Internal Medicine Clinic Attending  I saw and evaluated the patient.  I personally confirmed the key portions of the history and exam documented by Dr. Trilby Drummer and I reviewed pertinent patient test results.  The assessment, diagnosis, and plan were formulated together and I agree with the documentation in the resident's note.  I was present for the entirety of the procedure.   79 year old woman with recent hospitalization for severe anemia due to slow GI bleeding and iron deficiency. Post-hospitalization complicated by progressive right leg pain which became function limiting, now relying on son for all ADLs over the weekend. On exam, the right knee was warm and swollen with pain on passive range of motion. On ultrasound there was a moderate sized effusion in the suprapatellar pouch with mixed echogenicty including some hyperechoic dots in the effusion (maybe tophi). She had a fever to 100.9 and is tachycardic at 109, but otherwise comfortable. On arthrocentesis we drained 58ml of purulent fluid from the right medial suprapatellar bursa. Labs show leukocytosis to 30k and another acute on chronic kidney injury with creatinine up to 1.4. At this time, suspicion is high for septic arthritis versus crystal arthritis. We are going to admit her for empiric IV antibiotics. I anticipate the synovial cell count will be very high, plus or minus crystals. If synovial cell count is very high and neurtophil predominant, would consult with orthopedic surgery to consider joint washout. The patient has been NPO since this morning. The patient has dementia and limited understanding of medical issues, I updated her son Caitlin Cervantes who is at her bedside.

## 2019-10-06 NOTE — Progress Notes (Signed)
Pharmacy Antibiotic Note  Caitlin Cervantes is a 79 y.o. female admitted on 10/06/2019 with septic arthritis.  Pharmacy has been consulted for vancomycin dosing.    Plan: Vancomycin 1250mg  IV x 1, then 1000mg  IV q48h Monitor for clinical course, LOT, and deescalation     Temp (24hrs), Avg:100.3 F (37.9 C), Min:99.2 F (37.3 C), Max:100.9 F (38.3 C)  Recent Labs  Lab 09/30/19 1020 09/30/19 1533 09/30/19 2031 09/30/19 2251 10/01/19 0952 10/02/19 0237 10/02/19 0600 10/06/19 1435  WBC 13.1* 12.7*  --   --   --  15.1*  --  30.3*  CREATININE 2.22*  --   --   --  1.40*  --  1.15* 1.41*  LATICACIDVEN  --   --  1.2 0.7  --   --   --   --     Estimated Creatinine Clearance: 25.6 mL/min (A) (by C-G formula based on SCr of 1.41 mg/dL (H)).    No Known Allergies  Antimicrobials this admission: 3/15 Vancomycin >>    Dose adjustments this admission: n/a   Damaya Channing A. Levada Dy, PharmD, BCPS, FNKF Clinical Pharmacist [place] Please utilize Amion for appropriate phone number to reach the unit pharmacist ([place])   10/06/2019 6:19 PM

## 2019-10-06 NOTE — Assessment & Plan Note (Addendum)
Patient recently admitted with severe anemia. Evaluation showed: Patent [name], 6 cm hiatal hernia with cameron lesions, Many cratered and superficial gastric ulcers, and few non-bleeding superficial duodenal ulcers with a clean ulcer base. H. pylori testing was negative. Patient was started on Protonix 40mg  BID for 2 months, to be reduced to Daily after that. - Continue Protonix 40mg  BID - Avoid NSAIDs / ASA  - Follow up with GI - Repeat CBC

## 2019-10-06 NOTE — Patient Instructions (Addendum)
Thank you for allowing [place] to care for you  For your anemia - Recheck of blood count today - Continue Iron supplements  For your Stomach Ulcers and bleeding - No bacterial cause of ulcer based on initial testing - Continue Protonix - Follow up with GI  For your foot and knee pain - Knee Fluid analysis today

## 2019-10-06 NOTE — Progress Notes (Signed)
CC: Knee Pain, Foot Pain, Hospital follow up, Anemia, Gastric and duodenal Ulcers, Hiatal Hernia, Dementia, CKD III, AKI   HPI:  [name] is a 79 y.o. F with PMHx listed below presenting for Knee Pain, Foot Pain, Hospital follow up, Anemia, Gastric and duodenal Ulcers, Hiatal Hernia, Dementia, CKD III, AKI. Please see the A&P for the status of the patient's chronic medical problems.  Past Medical History:  Diagnosis Date  . Anemia 09/30/2019  . Diastolic dysfunction 7/51/7001   Grade I  . Essential hypertension 05/22/2015  . History of active tuberculosis 07/25/1947   s/p left lower lobectomy at age 68 and two years in a local [place] [facility]  . Hyperlipidemia 05/23/2015  . Idiopathic scoliosis of thoracolumbar spine 12/08/2016  . Osteoarthritis of right lower extremity 12/08/2016  . Overweight (BMI 25.0-29.9) 12/08/2016  . Seasonal allergic rhinitis due to pollen 12/08/2016  . Seizures ([place])    Possible seizure disorder marked by stareing events.  . Stage III chronic kidney disease 05/22/2018   Review of Systems:  Performed and all others negative.  Physical Exam:  Vitals:   10/06/19 1323  BP: (!) 162/81  Pulse: (!) 109  Temp: (!) 100.9 F (38.3 C)  TempSrc: Oral  SpO2: 100%  Height: 5\' 1"  (1.549 m)   Physical Exam Constitutional:      General: She is not in acute distress.    Appearance: Normal appearance.  Cardiovascular:     Rate and Rhythm: Regular rhythm. Tachycardia present.     Pulses: Normal pulses.     Heart sounds: Normal heart sounds.  Pulmonary:     Effort: Pulmonary effort is normal. No respiratory distress.     Breath sounds: Normal breath sounds.  Abdominal:     General: Bowel sounds are normal. There is no distension.     Palpations: Abdomen is soft.     Tenderness: There is no abdominal tenderness.  Musculoskeletal:     Comments: R Knee: - Inspection: Effusion noted. Trace erythema. - Palpation: Mild TTP - ROM: ROM limited by  pain - Strength: Grossly intact, eval limited by pain  R Foot - Inspection: Small fluid pocket at later ankle, mild erythema of first MTP - Palpation: Mild-Moderate TTP of Right MTP joints - ROM: Grossly intact - Strength: Grossly intact   L Foot - Inspection: No erythema, edema, nor warmth - Pain dorsal aspect of MTPs when pressure is applied to plantar aspect - ROM and Strength: Grossly intact  No calf/leg edema   Skin:    General: Skin is warm and dry.  Neurological:     General: No focal deficit present.     Mental Status: Mental status is at baseline.     Knee Arthrocentesis without Injection Procedure Note  Diagnosis: right knee pain  Indications: Unexplained joint effusion  Anesthesia: Lidocaine 1% with epinephrine  Procedure Details   Point of care ultrasound was used to identify the joint effusion and plan needle trajectory and depth. Consent was obtained for the procedure. The joint was prepped with Betadine. A 22 gauge needle was inserted into the superior aspect of the joint from a medial approach to access the suprapatellar pouch. 20 ml of cloudy yellow fluid was removed from the joint and sent to the lab for analysis. No injection performed due to need to rule out septic arthritis. The needle was removed and the area cleansed and dressed.  Complications:  None; patient tolerated the procedure well.  Assessment & Plan:  See Encounters Tab for problem based charting.  Patient seen with Dr. Evette Doffing

## 2019-10-06 NOTE — Plan of Care (Signed)
  Problem: Pain Managment: Goal: General experience of comfort will improve Outcome: Progressing   Problem: Safety: Goal: Ability to remain free from injury will improve Outcome: Progressing   Problem: Skin Integrity: Goal: Risk for impaired skin integrity will decrease Outcome: Progressing   

## 2019-10-06 NOTE — Assessment & Plan Note (Addendum)
Patient reports pain and swelling in her right foot since arriving home 3 days ago (after discharge from hospital for anemia). She had pain in her right foot first and now has some pain in her left foot. On exam her right foot is mildly edematous with a pocket of fatty tissue with some fluid at her lateral ankle. She has tenderness of her MTPs when pressure is applied. Her Left foot is normal in appearance but will also have MTP pain with pressure. She reports some right knee pain as well. Her knee is also swollen, warm, and there is trace erythema. We will evaluate the fluid in her knee for possible gout versus septic joint. No history of gout. - Fluid analysis from right knee, see separate note - CBC

## 2019-10-07 ENCOUNTER — Encounter (HOSPITAL_COMMUNITY): Payer: Self-pay | Admitting: Internal Medicine

## 2019-10-07 ENCOUNTER — Observation Stay (HOSPITAL_COMMUNITY): Payer: Medicare Other

## 2019-10-07 ENCOUNTER — Other Ambulatory Visit: Payer: Self-pay

## 2019-10-07 DIAGNOSIS — M130 Polyarthritis, unspecified: Secondary | ICD-10-CM

## 2019-10-07 DIAGNOSIS — K449 Diaphragmatic hernia without obstruction or gangrene: Secondary | ICD-10-CM | POA: Diagnosis not present

## 2019-10-07 DIAGNOSIS — Z8249 Family history of ischemic heart disease and other diseases of the circulatory system: Secondary | ICD-10-CM | POA: Diagnosis not present

## 2019-10-07 DIAGNOSIS — D500 Iron deficiency anemia secondary to blood loss (chronic): Secondary | ICD-10-CM | POA: Diagnosis present

## 2019-10-07 DIAGNOSIS — D473 Essential (hemorrhagic) thrombocythemia: Secondary | ICD-10-CM | POA: Diagnosis not present

## 2019-10-07 DIAGNOSIS — E876 Hypokalemia: Secondary | ICD-10-CM | POA: Diagnosis present

## 2019-10-07 DIAGNOSIS — K219 Gastro-esophageal reflux disease without esophagitis: Secondary | ICD-10-CM | POA: Diagnosis not present

## 2019-10-07 DIAGNOSIS — L98419 Non-pressure chronic ulcer of buttock with unspecified severity: Secondary | ICD-10-CM | POA: Diagnosis not present

## 2019-10-07 DIAGNOSIS — M6281 Muscle weakness (generalized): Secondary | ICD-10-CM | POA: Diagnosis not present

## 2019-10-07 DIAGNOSIS — Z902 Acquired absence of lung [part of]: Secondary | ICD-10-CM | POA: Diagnosis not present

## 2019-10-07 DIAGNOSIS — M11261 Other chondrocalcinosis, right knee: Secondary | ICD-10-CM | POA: Diagnosis not present

## 2019-10-07 DIAGNOSIS — N1832 Chronic kidney disease, stage 3b: Secondary | ICD-10-CM | POA: Diagnosis present

## 2019-10-07 DIAGNOSIS — R531 Weakness: Secondary | ICD-10-CM | POA: Diagnosis not present

## 2019-10-07 DIAGNOSIS — N179 Acute kidney failure, unspecified: Secondary | ICD-10-CM | POA: Diagnosis present

## 2019-10-07 DIAGNOSIS — M25569 Pain in unspecified knee: Secondary | ICD-10-CM | POA: Diagnosis not present

## 2019-10-07 DIAGNOSIS — M1129 Other chondrocalcinosis, multiple sites: Secondary | ICD-10-CM | POA: Diagnosis present

## 2019-10-07 DIAGNOSIS — K5790 Diverticulosis of intestine, part unspecified, without perforation or abscess without bleeding: Secondary | ICD-10-CM | POA: Diagnosis not present

## 2019-10-07 DIAGNOSIS — M2550 Pain in unspecified joint: Secondary | ICD-10-CM | POA: Diagnosis not present

## 2019-10-07 DIAGNOSIS — I129 Hypertensive chronic kidney disease with stage 1 through stage 4 chronic kidney disease, or unspecified chronic kidney disease: Secondary | ICD-10-CM | POA: Diagnosis present

## 2019-10-07 DIAGNOSIS — R52 Pain, unspecified: Secondary | ICD-10-CM | POA: Diagnosis not present

## 2019-10-07 DIAGNOSIS — K922 Gastrointestinal hemorrhage, unspecified: Secondary | ICD-10-CM | POA: Diagnosis not present

## 2019-10-07 DIAGNOSIS — D509 Iron deficiency anemia, unspecified: Secondary | ICD-10-CM | POA: Diagnosis not present

## 2019-10-07 DIAGNOSIS — I361 Nonrheumatic tricuspid (valve) insufficiency: Secondary | ICD-10-CM | POA: Diagnosis not present

## 2019-10-07 DIAGNOSIS — Z79899 Other long term (current) drug therapy: Secondary | ICD-10-CM | POA: Diagnosis not present

## 2019-10-07 DIAGNOSIS — F0390 Unspecified dementia without behavioral disturbance: Secondary | ICD-10-CM | POA: Diagnosis present

## 2019-10-07 DIAGNOSIS — I10 Essential (primary) hypertension: Secondary | ICD-10-CM | POA: Diagnosis not present

## 2019-10-07 DIAGNOSIS — I340 Nonrheumatic mitral (valve) insufficiency: Secondary | ICD-10-CM | POA: Diagnosis not present

## 2019-10-07 DIAGNOSIS — R509 Fever, unspecified: Secondary | ICD-10-CM | POA: Diagnosis not present

## 2019-10-07 DIAGNOSIS — K257 Chronic gastric ulcer without hemorrhage or perforation: Secondary | ICD-10-CM | POA: Diagnosis present

## 2019-10-07 DIAGNOSIS — R569 Unspecified convulsions: Secondary | ICD-10-CM | POA: Diagnosis not present

## 2019-10-07 DIAGNOSIS — Z8611 Personal history of tuberculosis: Secondary | ICD-10-CM | POA: Diagnosis not present

## 2019-10-07 DIAGNOSIS — D72829 Elevated white blood cell count, unspecified: Secondary | ICD-10-CM | POA: Diagnosis not present

## 2019-10-07 DIAGNOSIS — E785 Hyperlipidemia, unspecified: Secondary | ICD-10-CM | POA: Diagnosis not present

## 2019-10-07 DIAGNOSIS — Z806 Family history of leukemia: Secondary | ICD-10-CM | POA: Diagnosis not present

## 2019-10-07 DIAGNOSIS — Z20822 Contact with and (suspected) exposure to covid-19: Secondary | ICD-10-CM | POA: Diagnosis present

## 2019-10-07 DIAGNOSIS — Z7401 Bed confinement status: Secondary | ICD-10-CM | POA: Diagnosis not present

## 2019-10-07 DIAGNOSIS — L89152 Pressure ulcer of sacral region, stage 2: Secondary | ICD-10-CM | POA: Diagnosis not present

## 2019-10-07 LAB — BASIC METABOLIC PANEL
Anion gap: 16 — ABNORMAL HIGH (ref 5–15)
BUN: 29 mg/dL — ABNORMAL HIGH (ref 8–23)
CO2: 22 mmol/L (ref 22–32)
Calcium: 8.5 mg/dL — ABNORMAL LOW (ref 8.9–10.3)
Chloride: 97 mmol/L — ABNORMAL LOW (ref 98–111)
Creatinine, Ser: 2.18 mg/dL — ABNORMAL HIGH (ref 0.44–1.00)
GFR calc Af Amer: 25 mL/min — ABNORMAL LOW (ref >60)
GFR calc non Af Amer: 21 mL/min — ABNORMAL LOW (ref >60)
Glucose, Bld: 87 mg/dL (ref 70–99)
Potassium: 3.2 mmol/L — ABNORMAL LOW (ref 3.5–5.1)
Sodium: 135 mmol/L (ref 135–145)

## 2019-10-07 LAB — CBC WITH DIFFERENTIAL/PLATELET
Abs Immature Granulocytes: 0.28 10*3/uL — ABNORMAL HIGH (ref 0.00–0.07)
Basophils Absolute: 0.1 10*3/uL (ref 0.0–0.1)
Basophils Relative: 0 %
Eosinophils Absolute: 0 10*3/uL (ref 0.0–0.5)
Eosinophils Relative: 0 %
HCT: 28.7 % — ABNORMAL LOW (ref 36.0–46.0)
Hemoglobin: 9.3 g/dL — ABNORMAL LOW (ref 12.0–15.0)
Immature Granulocytes: 1 %
Lymphocytes Relative: 11 %
Lymphs Abs: 2.7 10*3/uL (ref 0.7–4.0)
MCH: 27.5 pg (ref 26.0–34.0)
MCHC: 32.4 g/dL (ref 30.0–36.0)
MCV: 84.9 fL (ref 80.0–100.0)
Monocytes Absolute: 2.7 10*3/uL — ABNORMAL HIGH (ref 0.1–1.0)
Monocytes Relative: 12 %
Neutro Abs: 17.8 10*3/uL — ABNORMAL HIGH (ref 1.7–7.7)
Neutrophils Relative %: 76 %
Platelets: 446 10*3/uL — ABNORMAL HIGH (ref 150–400)
RBC: 3.38 MIL/uL — ABNORMAL LOW (ref 3.87–5.11)
RDW: 18.5 % — ABNORMAL HIGH (ref 11.5–15.5)
WBC: 23.6 10*3/uL — ABNORMAL HIGH (ref 4.0–10.5)
nRBC: 0 % (ref 0.0–0.2)

## 2019-10-07 LAB — CBC
HCT: 26.7 % — ABNORMAL LOW (ref 36.0–46.0)
Hemoglobin: 8.6 g/dL — ABNORMAL LOW (ref 12.0–15.0)
MCH: 27 pg (ref 26.0–34.0)
MCHC: 32.2 g/dL (ref 30.0–36.0)
MCV: 84 fL (ref 80.0–100.0)
Platelets: 461 10*3/uL — ABNORMAL HIGH (ref 150–400)
RBC: 3.18 MIL/uL — ABNORMAL LOW (ref 3.87–5.11)
RDW: 18.4 % — ABNORMAL HIGH (ref 11.5–15.5)
WBC: 22.9 10*3/uL — ABNORMAL HIGH (ref 4.0–10.5)
nRBC: 0 % (ref 0.0–0.2)

## 2019-10-07 LAB — SURGICAL PCR SCREEN
MRSA, PCR: NEGATIVE
Staphylococcus aureus: NEGATIVE

## 2019-10-07 LAB — MAGNESIUM: Magnesium: 1.5 mg/dL — ABNORMAL LOW (ref 1.7–2.4)

## 2019-10-07 MED ORDER — ENOXAPARIN SODIUM 30 MG/0.3ML ~~LOC~~ SOLN
30.0000 mg | SUBCUTANEOUS | Status: DC
  Administered 2019-10-07 – 2019-10-13 (×7): 30 mg via SUBCUTANEOUS
  Filled 2019-10-07 (×8): qty 0.3

## 2019-10-07 MED ORDER — ENOXAPARIN SODIUM 30 MG/0.3ML ~~LOC~~ SOLN: 30.0000 mg | SUBCUTANEOUS | Status: DC

## 2019-10-07 MED ORDER — MAGNESIUM SULFATE 2 GM/50ML IV SOLN
2.0000 g | Freq: Once | INTRAVENOUS | Status: AC
  Administered 2019-10-07: 2 g via INTRAVENOUS
  Filled 2019-10-07: qty 50

## 2019-10-07 MED ORDER — SODIUM CHLORIDE 0.9 % IV SOLN
1.0000 g | INTRAVENOUS | Status: DC
  Administered 2019-10-07 – 2019-10-09 (×3): 1 g via INTRAVENOUS
  Filled 2019-10-07 (×3): qty 10

## 2019-10-07 MED ORDER — POTASSIUM CHLORIDE 20 MEQ PO PACK
40.0000 meq | PACK | Freq: Once | ORAL | Status: AC
  Administered 2019-10-07: 40 meq via ORAL
  Filled 2019-10-07: qty 2

## 2019-10-07 MED ORDER — SODIUM CHLORIDE 0.9 % IV SOLN: INTRAVENOUS | Status: DC

## 2019-10-07 MED ORDER — LACTATED RINGERS IV BOLUS
1000.0000 mL | Freq: Once | INTRAVENOUS | Status: AC
  Administered 2019-10-07: 1000 mL via INTRAVENOUS

## 2019-10-07 NOTE — Plan of Care (Signed)
  Problem: Safety: Goal: Ability to remain free from injury will improve Outcome: Progressing   

## 2019-10-07 NOTE — Progress Notes (Addendum)
Vitals post 1L NS bolus: 98/53, pulse 83. Provider notified.

## 2019-10-07 NOTE — Progress Notes (Signed)
Provider notified about pt's blood pressure taken post LR bolus. Vitals taken at 09:05, blood pressure 88/45 and pulse 82. Verbal order given to bolus 1L NS, then run NS at 57ml/hr per MAR.

## 2019-10-07 NOTE — Progress Notes (Signed)
   NAME:  Caitlin Cervantes, MRN:  [id number], DOB:  [birth year], LOS: 0 ADMISSION DATE:  10/06/2019  Subjective/Interm hx  No complaints this morning  Objective   Blood pressure (!) 85/46, pulse 81, temperature (!) 100.5 F (38.1 C), temperature source Oral, resp. rate 16, SpO2 98 %.     Intake/Output Summary (Last 24 hours) at 10/07/2019 0527 Last data filed at 10/07/2019 0100 Gross per 24 hour  Intake --  Output 200 ml  Net -200 ml    Examination: GENERAL: in no acute distress CARDIAC: heart RRR. Murmur noted at the LLSB PULMONARY: course crackles at the right base MSK:: swelling involving the right knee. Tender to palpation. Effusion present.  Pain with ROM . She also has pain with palpation of the top of both her feet and ankles. No effusions appreciated there. No erythema.   Significant Diagnostic Tests:  3/15 L foot xray> Acute appearing slightly impacted fracture at the head of the fifth proximal phalanx 3/15 R foot xray>No acute osseous abnormality.  Arthritis at the first MTP joint 3/15 R knee xray> no acute osseous abnormality. Generalized soft tissue swelling with knee effusion  Micro Data:  R knee synovial fluid culture> NGTD; Gram stain neg--abundant PMNs  Blood cultures>NGTD  Antimicrobials:  Vancomycin 3/15>  Labs    CBC Latest Ref Rng & Units 10/06/2019 10/02/2019 10/01/2019  WBC 4.0 - 10.5 K/uL 30.3(H) 15.1(H) -  Hemoglobin 12.0 - 15.0 g/dL 12.0 10.3(L) 10.9(L)  Hematocrit 36.0 - 46.0 % 36.9 30.5(L) 32.2(L)  Platelets 150 - 400 K/uL 505(H) 270 -   BMP Latest Ref Rng & Units 10/06/2019 10/02/2019 10/01/2019  Glucose 70 - 99 mg/dL 128(H) 86 108(H)  BUN 8 - 23 mg/dL 22 14 22   Creatinine 0.44 - 1.00 mg/dL 1.41(H) 1.15(H) 1.40(H)  BUN/Creat Ratio 12 - 28 - - -  Sodium 135 - 145 mmol/L 136 140 142  Potassium 3.5 - 5.1 mmol/L 3.6 3.7 3.9  Chloride 98 - 111 mmol/L 95(L) 107 111  CO2 22 - 32 mmol/L 25 19(L) 19(L)  Calcium 8.9 - 10.3 mg/dL 9.5 8.9 9.4    Summary   Caitlin Cervantes is a 79 yo female who was admitted to [facility] on 3/15 for right knee pain suspected to be septic arthritis vs pseudogout.  Assessment & Plan:  Active Problems:   Septic arthritis ([facility])  Right knee pain--suspected pseudogout Synovial fluid with CCP crystals, WBC 36k Persistent fever overnight concerning for septic joint however gram stain is negative. Cultures pending. Appreciate ortho consult R/o sepsis. Source most likely be from joint however can not rule out other source. Febrile overnight with Tmax 101.7. Blood pressures have been trending down since admission. MAP 59 this morning White count coming down--30k>23k. No acute findings on CXR. UA/UC pending. Plan Continue vancomycin until cultures result Tylenol for fever Follow blood/urine cultures UA Can consider CXR  1L bolus  Hypertension. Blood pressures trending down overnight. Continue to hold home antihypertensives  CKD III. Renal function up since admission. Vancomycin vs hypotension. Continue to monitor. Hypokalemia--3.2 this morning. Replete as necessary.  Best practice:  CODE STATUS: FULL Diet: NPO DVT for prophylaxis: SCDs;  Dispo: pending further evaluation   Mitzi Hansen, MD INTERNAL MEDICINE RESIDENT PGY-1 PAGER #: [phone] 10/07/19  5:27 AM

## 2019-10-07 NOTE — Evaluation (Signed)
Physical Therapy Evaluation Patient Details Name: Caitlin Cervantes MRN: [id number] DOB: 1940/12/19 Today's Date: 10/07/2019   History of Present Illness  Patient is a 79 y/o female who presents with right knee pain and fever. Recent hospitalization 3/9-3/11 for severe anemia with gastric ulcers. Admitted with Septic arthritis vs pseudogout. PMH includes dementia, CKD, seizures, HTN, HLD.  Clinical Impression  Patient presents with right knee pain, decreased AROM BLEs, impaired balance and impaired mobility s/p above. Pt reports having difficulty with mobility since recent admission but prior to this independent with ADLs and driving. Today, pt requires Mod A for bed mobility. Not able to stand from elevated bed height with Max A and cues for technique, limited mainly by pain and pt resisting therapist. Instructed pt in exercises to perform. Pt not safe to return home as she is far from functional baseline. Would benefit from SNF to maximize independence and mobility prior to return home. Pt agreeable. Will follow acutely.    Follow Up Recommendations SNF;Supervision for mobility/OOB    Equipment Recommendations  None recommended by PT    Recommendations for Other Services       Precautions / Restrictions Precautions Precautions: Fall Restrictions Weight Bearing Restrictions: No      Mobility  Bed Mobility Overal bed mobility: Needs Assistance Bed Mobility: Supine to Sit;Sit to Supine     Supine to sit: HOB elevated;Mod assist Sit to supine: Mod assist;HOB elevated   General bed mobility comments: Increased time/effort, assist needed with LEs to get to EOB/return to supine.  Transfers Overall transfer level: Needs assistance Equipment used: Rolling walker (2 wheeled) Transfers: Sit to/from Stand Sit to Stand: Max assist         General transfer comment: Pt not able to stand even with mAx A from elevated bed height; pushing posteriorly and resisting therapist. Reluctant to  place any weight throguh BLEs.  Ambulation/Gait             General Gait Details: Unable  Stairs            Wheelchair Mobility    Modified Rankin (Stroke Patients Only)       Balance Overall balance assessment: Needs assistance Sitting-balance support: Feet supported;Single extremity supported Sitting balance-Leahy Scale: Fair Sitting balance - Comments: supervision for safety.       Standing balance comment: unable to clear bottom from bed                             Pertinent Vitals/Pain Pain Assessment: Faces Faces Pain Scale: Hurts whole lot Pain Location: BLEs with movement/attempted WB Pain Descriptors / Indicators: Guarding;Sore;Discomfort Pain Intervention(s): Repositioned;Monitored during session;Limited activity within patient's tolerance    Home Living Family/patient expects to be discharged to:: Private residence Living Arrangements: Children Available Help at Discharge: Family;Available PRN/intermittently Type of Home: Apartment Home Access: Stairs to enter Entrance Stairs-Rails: Right Entrance Stairs-Number of Steps: flight Home Layout: One level Home Equipment: Environmental consultant - 2 wheels      Prior Function Level of Independence: Independent         Comments: still drives, cooks, Development worker, international aid Dominance   Dominant Hand: Right    Extremity/Trunk Assessment   Upper Extremity Assessment Upper Extremity Assessment: Defer to OT evaluation    Lower Extremity Assessment Lower Extremity Assessment: Generalized weakness;RLE deficits/detail;LLE deficits/detail RLE Deficits / Details: Limited ankle AROM, knee AROM due to pain. RLE: Unable to fully assess due to pain  RLE Sensation: WNL LLE Deficits / Details: Limited ankle AROM, knee AROM due to pain. LLE: Unable to fully assess due to pain LLE Sensation: WNL    Cervical / Trunk Assessment Cervical / Trunk Assessment: Normal  Communication   Communication: No difficulties   Cognition Arousal/Alertness: Awake/alert Behavior During Therapy: WFL for tasks assessed/performed Overall Cognitive Status: No family/caregiver present to determine baseline cognitive functioning                                 General Comments: Not able to recall if anyone has been in the room. impaired memory, Not the best historian.      General Comments General comments (skin integrity, edema, etc.): Son present during session.    Exercises     Assessment/Plan    PT Assessment Patient needs continued PT services  PT Problem List Decreased strength;Decreased balance;Decreased mobility;Decreased knowledge of use of DME;Decreased cognition;Decreased range of motion;Pain       PT Treatment Interventions Gait training;Stair training;DME instruction;Functional mobility training;Therapeutic activities;Therapeutic exercise;Balance training;Patient/family education    PT Goals (Current goals can be found in the Care Plan section)  Acute Rehab PT Goals Patient Stated Goal: to get this pain better PT Goal Formulation: With patient Time For Goal Achievement: 10/21/19 Potential to Achieve Goals: Fair    Frequency Min 2X/week   Barriers to discharge Inaccessible home environment;Decreased caregiver support stairs, son works    Co-evaluation               AM-PAC PT "6 Clicks" Mobility  Outcome Measure Help needed turning from your back to your side while in a flat bed without using bedrails?: A Little Help needed moving from lying on your back to sitting on the side of a flat bed without using bedrails?: A Lot Help needed moving to and from a bed to a chair (including a wheelchair)?: Total Help needed standing up from a chair using your arms (e.g., wheelchair or bedside chair)?: Total Help needed to walk in hospital room?: Total Help needed climbing 3-5 steps with a railing? : Total 6 Click Score: 9    End of Session Equipment Utilized During Treatment:  Gait belt Activity Tolerance: Patient limited by pain Patient left: in bed;with call bell/phone within reach;with bed alarm set;with family/visitor present Nurse Communication: Mobility status;Need for lift equipment PT Visit Diagnosis: Muscle weakness (generalized) (M62.81);Unsteadiness on feet (R26.81);Pain Pain - Right/Left: Right Pain - part of body: Knee    Time: 1511-1530 PT Time Calculation (min) (ACUTE ONLY): 19 min   Charges:   PT Evaluation $PT Eval Moderate Complexity: 1 Mod          Marisa Severin, PT, DPT Acute Rehabilitation Services Pager [phone] Office [phone]      [place] 10/07/2019, 3:45 PM

## 2019-10-07 NOTE — Addendum Note (Signed)
Addended by: Lalla Brothers T on: 10/07/2019 06:54 AM   Modules accepted: Level of Service

## 2019-10-08 DIAGNOSIS — M11261 Other chondrocalcinosis, right knee: Secondary | ICD-10-CM | POA: Diagnosis present

## 2019-10-08 LAB — BASIC METABOLIC PANEL
Anion gap: 10 (ref 5–15)
BUN: 29 mg/dL — ABNORMAL HIGH (ref 8–23)
CO2: 23 mmol/L (ref 22–32)
Calcium: 8.6 mg/dL — ABNORMAL LOW (ref 8.9–10.3)
Chloride: 104 mmol/L (ref 98–111)
Creatinine, Ser: 1.78 mg/dL — ABNORMAL HIGH (ref 0.44–1.00)
GFR calc Af Amer: 32 mL/min — ABNORMAL LOW (ref >60)
GFR calc non Af Amer: 27 mL/min — ABNORMAL LOW (ref >60)
Glucose, Bld: 95 mg/dL (ref 70–99)
Potassium: 4.1 mmol/L (ref 3.5–5.1)
Sodium: 137 mmol/L (ref 135–145)

## 2019-10-08 LAB — CBC
HCT: 26.1 % — ABNORMAL LOW (ref 36.0–46.0)
Hemoglobin: 8.4 g/dL — ABNORMAL LOW (ref 12.0–15.0)
MCH: 27.2 pg (ref 26.0–34.0)
MCHC: 32.2 g/dL (ref 30.0–36.0)
MCV: 84.5 fL (ref 80.0–100.0)
Platelets: 520 10*3/uL — ABNORMAL HIGH (ref 150–400)
RBC: 3.09 MIL/uL — ABNORMAL LOW (ref 3.87–5.11)
RDW: 18.5 % — ABNORMAL HIGH (ref 11.5–15.5)
WBC: 19.2 10*3/uL — ABNORMAL HIGH (ref 4.0–10.5)
nRBC: 0 % (ref 0.0–0.2)

## 2019-10-08 MED ORDER — LACTATED RINGERS IV SOLN: INTRAVENOUS | Status: AC

## 2019-10-08 NOTE — Progress Notes (Addendum)
NAME:  LURLENE RONDA, MRN:  [id number], DOB:  06-29-1941, LOS: 1 ADMISSION DATE:  10/06/2019  Subjective/Interm hx  No complaints this morning. Still having right knee pain  Objective   Blood pressure 120/60, pulse 88, temperature 100 F (37.8 C), temperature source Oral, resp. rate 15, weight 51.9 kg, SpO2 96 %.     Intake/Output Summary (Last 24 hours) at 10/08/2019 0552 Last data filed at 10/07/2019 2100 Gross per 24 hour  Intake 160 ml  Output 650 ml  Net -490 ml    Examination: GENERAL: in no acute distress CARDIAC: regular rate PULMONARY: course crackles at the right base MSK: still has some pretty significant tenderness in the right knee. Effusion present. Slightly warmer than the left. ROM limited by pain.   Significant Diagnostic Tests:  3/15 L foot xray> Acute appearing slightly impacted fracture at the head of the fifth proximal phalanx 3/15 R foot xray>No acute osseous abnormality.  Arthritis at the first MTP joint 3/15 R knee xray> no acute osseous abnormality. Generalized soft tissue swelling with knee effusion  Micro Data:  R knee synovial fluid culture> NGTD; Gram stain neg--abundant PMNs  Blood cultures>NGTD  Antimicrobials:  Vancomycin 3/15> Rocephin 3/16>  Labs    CBC Latest Ref Rng & Units 10/08/2019 10/07/2019 10/07/2019  WBC 4.0 - 10.5 K/uL 19.2(H) 22.9(H) 23.6(H)  Hemoglobin 12.0 - 15.0 g/dL 8.4(L) 8.6(L) 9.3(L)  Hematocrit 36.0 - 46.0 % 26.1(L) 26.7(L) 28.7(L)  Platelets 150 - 400 K/uL 520(H) 461(H) 446(H)   BMP Latest Ref Rng & Units 10/08/2019 10/07/2019 10/06/2019  Glucose 70 - 99 mg/dL 95 87 128(H)  BUN 8 - 23 mg/dL 29(H) 29(H) 22  Creatinine 0.44 - 1.00 mg/dL 1.78(H) 2.18(H) 1.41(H)  BUN/Creat Ratio 12 - 28 - - -  Sodium 135 - 145 mmol/L 137 135 136  Potassium 3.5 - 5.1 mmol/L 4.1 3.2(L) 3.6  Chloride 98 - 111 mmol/L 104 97(L) 95(L)  CO2 22 - 32 mmol/L 23 22 25   Calcium 8.9 - 10.3 mg/dL 8.6(L) 8.5(L) 9.5    Summary  RENETA NIEHAUS  is a 79 yo female who was admitted to IMTS on 3/15 for right knee pain suspected to be septic arthritis vs pseudogout.  Assessment & Plan:  Active Problems:   Stage III chronic kidney disease   Dementia (HCC)   Septic arthritis (HCC)  Right knee pain with effusion, Fever and Leukocytosis--pseudogout found on synovial fluid analysis Synovial fluid with CCP crystals, WBC 36k Afebrile overnight. Pressures improved. Leukocytosis slightly improved. Cultures pending. Appreciate ortho consult PT recommending SNF Plan Continue vancomycin and rocephin until cultures finalized Tylenol for fever Follow blood/synovial cultures  Acute on chronic iron deficient anemia due to Chronic GI blood loss. Primary issue on last admission at which time she underwent colonoscopy and EGD which revealed numerous gastric ulcers but no source of active bleeding. She received 3U PRCB that admission and was discharged with a hgb of 10.9.  Admission hgb this time was in the mid nines however has been slowly trending down again. Will need to continue to monitor closely and transfusion if <7. Received one iron transfusion last time however will hold off on that this admission as there is still concern for infection with her septic joint.  Hypertension.  Continue to hold home antihypertensives  AKI on CKD IIIB. Improved this morning. Will continue IVF and monitor.  Best practice:  CODE STATUS: FULL Diet: cardiac DVT for prophylaxis: SCDs;  Dispo: PT Recommending SNF. Will discuss  further with patient's son today.   Mitzi Hansen, MD INTERNAL MEDICINE RESIDENT PGY-1 PAGER #: [phone] 10/08/19  5:52 AM

## 2019-10-08 NOTE — Plan of Care (Signed)
  Problem: Safety: Goal: Ability to remain free from injury will improve Outcome: Progressing   Problem: Skin Integrity: Goal: Risk for impaired skin integrity will decrease Outcome: Progressing   

## 2019-10-09 LAB — CBC
HCT: 27.9 % — ABNORMAL LOW (ref 36.0–46.0)
Hemoglobin: 9 g/dL — ABNORMAL LOW (ref 12.0–15.0)
MCH: 27.2 pg (ref 26.0–34.0)
MCHC: 32.3 g/dL (ref 30.0–36.0)
MCV: 84.3 fL (ref 80.0–100.0)
Platelets: 620 10*3/uL — ABNORMAL HIGH (ref 150–400)
RBC: 3.31 MIL/uL — ABNORMAL LOW (ref 3.87–5.11)
RDW: 18.5 % — ABNORMAL HIGH (ref 11.5–15.5)
WBC: 19.7 10*3/uL — ABNORMAL HIGH (ref 4.0–10.5)
nRBC: 0 % (ref 0.0–0.2)

## 2019-10-09 LAB — BASIC METABOLIC PANEL
Anion gap: 14 (ref 5–15)
BUN: 24 mg/dL — ABNORMAL HIGH (ref 8–23)
CO2: 23 mmol/L (ref 22–32)
Calcium: 8.5 mg/dL — ABNORMAL LOW (ref 8.9–10.3)
Chloride: 101 mmol/L (ref 98–111)
Creatinine, Ser: 1.42 mg/dL — ABNORMAL HIGH (ref 0.44–1.00)
GFR calc Af Amer: 41 mL/min — ABNORMAL LOW (ref >60)
GFR calc non Af Amer: 36 mL/min — ABNORMAL LOW (ref >60)
Glucose, Bld: 109 mg/dL — ABNORMAL HIGH (ref 70–99)
Potassium: 4 mmol/L (ref 3.5–5.1)
Sodium: 138 mmol/L (ref 135–145)

## 2019-10-09 NOTE — Plan of Care (Signed)
°  Problem: Education: °Goal: Knowledge of General Education information will improve °Description: Including pain rating scale, medication(s)/side effects and non-pharmacologic comfort measures °Outcome: Progressing °  °Problem: Health Behavior/Discharge Planning: °Goal: Ability to manage health-related needs will improve °Outcome: Progressing °  °Problem: Clinical Measurements: °Goal: Cardiovascular complication will be avoided °Outcome: Progressing °  °

## 2019-10-09 NOTE — Progress Notes (Signed)
Pharmacy Antibiotic Note  Caitlin Cervantes is a 79 y.o. female admitted on 10/06/2019 with  septic arthritis.  Pharmacy has been consulted for Vanco dosing.  ID: day# 4 Vanc for septic arthritis, knee tapped 3/15 pm, prior to starting abx. Bilateral ankle and knee pain. Ortho thinks pseudogout, may consider steroid injection if cultures remain negative - Tmax 99.4, WBC 19.7 down, Scr 1.42 up and down daily. Cultures still IP.   Vanc 3/15 >> CTX 3/16 >>   3/15 blood x 2: IP  3/16 MRSA PCR: negative  3/18: Recommend 1000mg /48 (alt dosing) for AUC 533.8, Scr 1.42   Plan: Vanc 1000mg  IV q48h F/u daily Scr changes and cultures CTX 1 gm IV q24h added per MD Con't abx until cultures result.    Weight: 112 lb 7 oz (51 kg)  Temp (24hrs), Avg:98.9 F (37.2 C), Min:98.4 F (36.9 C), Max:99.4 F (37.4 C)  Recent Labs  Lab 10/06/19 1435 10/07/19 0355 10/07/19 0836 10/08/19 0153 10/09/19 0353  WBC 30.3* 23.6* 22.9* 19.2* 19.7*  CREATININE 1.41* 2.18*  --  1.78* 1.42*    Estimated Creatinine Clearance: 25.4 mL/min (A) (by C-G formula based on SCr of 1.42 mg/dL (H)).    No Known Allergies  Perrion Diesel S. Alford Highland, PharmD, BCPS Clinical Staff Pharmacist [email or web address]  [place] 10/09/2019 11:29 AM

## 2019-10-09 NOTE — Progress Notes (Signed)
UPDATE NOTE  Attempted to contact ortho regarding negative culture results and recommendations moving forward. Unfortunately, was unable to reach them so will try again tomorrow.

## 2019-10-09 NOTE — Progress Notes (Signed)
   NAME:  Caitlin Cervantes, MRN:  [id number], DOB:  08/07/1940, LOS: 2 ADMISSION DATE:  10/06/2019  Subjective/Interm hx  No overnight events. VSS. Afebrile No complaints this morning.  Objective   Blood pressure 128/77, pulse 88, temperature 98.4 F (36.9 C), temperature source Oral, resp. rate 18, weight 51.9 kg, SpO2 95 %.     Intake/Output Summary (Last 24 hours) at 10/09/2019 0535 Last data filed at 10/08/2019 2300 Gross per 24 hour  Intake 900 ml  Output 200 ml  Net 700 ml    Examination: GENERAL: in no acute distress CARDIAC: well perfused PULMONARY: breathing comfortably on room air. No accessory muscle use MSK: continues to have tenderness of the right knee and bilateral ankles. Effusion present on right knee. Right knee and bilateral ankles have limited ROM due to pain. No effusions appreciated on ankles bilaterally SKIN: right knee remains slightly warmer. No drainage from aspiration site. No open wounds on bilateral lower extremities.   Significant Diagnostic Tests:  3/15 L foot xray> Acute appearing slightly impacted fracture at the head of the fifth proximal phalanx 3/15 R foot xray>No acute osseous abnormality.  Arthritis at the first MTP joint 3/15 R knee xray> no acute osseous abnormality. Generalized soft tissue swelling with knee effusion  Micro Data:  R knee synovial fluid culture> NGTD; Gram stain neg--abundant PMNs  Blood cultures>NGTD  Antimicrobials:  Vancomycin 3/15> Rocephin 3/16>  Labs    CBC Latest Ref Rng & Units 10/09/2019 10/08/2019 10/07/2019  WBC 4.0 - 10.5 K/uL 19.7(H) 19.2(H) 22.9(H)  Hemoglobin 12.0 - 15.0 g/dL 9.0(L) 8.4(L) 8.6(L)  Hematocrit 36.0 - 46.0 % 27.9(L) 26.1(L) 26.7(L)  Platelets 150 - 400 K/uL 620(H) 520(H) 461(H)   BMP Latest Ref Rng & Units 10/09/2019 10/08/2019 10/07/2019  Glucose 70 - 99 mg/dL 109(H) 95 87  BUN 8 - 23 mg/dL 24(H) 29(H) 29(H)  Creatinine 0.44 - 1.00 mg/dL 1.42(H) 1.78(H) 2.18(H)  BUN/Creat Ratio 12 - 28 -  - -  Sodium 135 - 145 mmol/L 138 137 135  Potassium 3.5 - 5.1 mmol/L 4.0 4.1 3.2(L)  Chloride 98 - 111 mmol/L 101 104 97(L)  CO2 22 - 32 mmol/L 23 23 22   Calcium 8.9 - 10.3 mg/dL 8.5(L) 8.6(L) 8.5(L)    Summary  Caitlin Cervantes is a 79 yo female who was admitted to IMTS on 3/15 for right knee pain suspected to be septic arthritis vs pseudogout.  Assessment & Plan:  Active Problems:   Stage III chronic kidney disease   Dementia (HCC)   Septic arthritis (HCC)   Pseudogout of knee, right  Right knee pain with effusion. Pseudogout vs septic arthritis -pseudogout found on synovial fluid analysis -Synovial fluid with CCP crystals, WBC 36k. NGTD on culture. -Afebrile overnight. Pressures improved. -white count remains elevated at 20k -no growth on blood cultures -PT recommending SNF which son is agreeable to. TOC consult placed. Plan Continue vancomycin and rocephin until cultures finalized. Will try to discuss with ortho later today. Follow blood/synovial cultures  Acute on chronic iron deficient anemia. Stable.  Hypertension.  Continue to hold home antihypertensives  AKI on CKD IIIB. Improved this morning. Will continue IVF and monitor.  Best practice:  CODE STATUS: FULL Diet: cardiac DVT for prophylaxis: SCDs;  Dispo: PT Recommending SNF which son and pt are agreeable to. TOC consult placed.   Mitzi Hansen, MD INTERNAL MEDICINE RESIDENT PGY-1 PAGER #: [phone] 10/09/19  5:35 AM

## 2019-10-10 DIAGNOSIS — M130 Polyarthritis, unspecified: Secondary | ICD-10-CM | POA: Diagnosis present

## 2019-10-10 LAB — BODY FLUID CULTURE: Culture: NO GROWTH

## 2019-10-10 LAB — CBC
HCT: 28.7 % — ABNORMAL LOW (ref 36.0–46.0)
Hemoglobin: 9.2 g/dL — ABNORMAL LOW (ref 12.0–15.0)
MCH: 26.7 pg (ref 26.0–34.0)
MCHC: 32.1 g/dL (ref 30.0–36.0)
MCV: 83.4 fL (ref 80.0–100.0)
Platelets: 771 10*3/uL — ABNORMAL HIGH (ref 150–400)
RBC: 3.44 MIL/uL — ABNORMAL LOW (ref 3.87–5.11)
RDW: 18.2 % — ABNORMAL HIGH (ref 11.5–15.5)
WBC: 24.5 10*3/uL — ABNORMAL HIGH (ref 4.0–10.5)
nRBC: 0 % (ref 0.0–0.2)

## 2019-10-10 LAB — BASIC METABOLIC PANEL
Anion gap: 16 — ABNORMAL HIGH (ref 5–15)
BUN: 12 mg/dL (ref 8–23)
CO2: 19 mmol/L — ABNORMAL LOW (ref 22–32)
Calcium: 8.7 mg/dL — ABNORMAL LOW (ref 8.9–10.3)
Chloride: 98 mmol/L (ref 98–111)
Creatinine, Ser: 1.23 mg/dL — ABNORMAL HIGH (ref 0.44–1.00)
GFR calc Af Amer: 49 mL/min — ABNORMAL LOW (ref >60)
GFR calc non Af Amer: 43 mL/min — ABNORMAL LOW (ref >60)
Glucose, Bld: 110 mg/dL — ABNORMAL HIGH (ref 70–99)
Potassium: 3.9 mmol/L (ref 3.5–5.1)
Sodium: 133 mmol/L — ABNORMAL LOW (ref 135–145)

## 2019-10-10 LAB — HEPATITIS B SURFACE ANTIGEN: Hepatitis B Surface Ag: NONREACTIVE

## 2019-10-10 LAB — FERRITIN: Ferritin: 899 ng/mL — ABNORMAL HIGH (ref 11–307)

## 2019-10-10 LAB — HEPATITIS B CORE ANTIBODY, IGM: Hep B C IgM: NONREACTIVE

## 2019-10-10 MED ORDER — PREDNISONE 20 MG PO TABS
40.0000 mg | ORAL_TABLET | Freq: Every day | ORAL | Status: DC
  Administered 2019-10-10 – 2019-10-11 (×2): 40 mg via ORAL
  Filled 2019-10-10 (×2): qty 2

## 2019-10-10 NOTE — TOC Initial Note (Addendum)
Transition of Care [facility]) - Initial/Assessment Note    Patient Details  Name: Caitlin Cervantes MRN: [id number] Date of Birth: 12/20/1940  [facility] CM/SW Contact:    Sharin Mons, RN Phone Number: 10/10/2019, 9:18 AM  Clinical Narrative:       Presents with knee pain and fever/ septic arthritis vs pseudogout. Hx of dementia, CKD, seizures, HTN, HLD . Recently hospitalized  3/9-3/11 for severe anemia with gastric ulcers.  RNCM received consult for possible SNF placement at time of discharge. RNCM spoke with patient regarding PT recommendation of SNF placement at time of discharge. Patient reported she  is currently unable to care for self at her home given patient's current physical needs and fall risk. Patient expressed understanding of PT recommendation and is agreeable to SNF placement at time of discharge. Patient reports preference no preference . RNCM discussed insurance authorization process and provided Medicare SNF ratings list via email to pt's son [name] per pt's request. Patient expressed being hopeful for rehab and to feel better soon. No further questions reported at this time. RNCM to continue to follow and assist with discharge planning needs.  SNF referral faxed out...  Expected Discharge Plan: Skilled Nursing Facility Barriers to Discharge: Continued Medical Work up   Patient Goals and CMS Choice        Expected Discharge Plan and Services Expected Discharge Plan: [facility]     Prior Living Arrangements/Services      Activities of Daily Living Home Assistive Devices/Equipment: Eyeglasses ADL Screening (condition at time of admission) Patient's cognitive ability adequate to safely complete daily activities?: Yes Is the patient deaf or have difficulty hearing?: No Does the patient have difficulty seeing, even when wearing glasses/contacts?: No Does the patient have difficulty concentrating, remembering, or making decisions?:  No Patient able to express need for assistance with ADLs?: Yes Does the patient have difficulty dressing or bathing?: No Independently performs ADLs?: Yes (appropriate for developmental age) Does the patient have difficulty walking or climbing stairs?: Yes Weakness of Legs: Right Weakness of Arms/Hands: None  Permission Sought/Granted                  Emotional Assessment              Admission diagnosis:  Septic arthritis [facility]) [M00.9] Patient Active Problem List   Diagnosis Date Noted  . Pseudogout of knee, right 10/08/2019  . Hiatal hernia 10/06/2019  . Knee pain 10/06/2019  . Dementia ([place]) 10/06/2019  . Foot pain 10/06/2019  . Combined gastric and duodenal ulcer 10/06/2019  . Septic arthritis ([place]) 10/06/2019  . Acute renal failure superimposed on stage 3b chronic kidney disease ([place]) 09/30/2019  . Severe anemia 09/30/2019  . Increased anion gap metabolic acidosis 53/61/4431  . Leukocytosis 09/30/2019  . Iron deficiency anemia 09/30/2019  . Ischemic vascular disease 05/05/2019  . Stage III chronic kidney disease 05/22/2018  . Diastolic dysfunction 54/00/8676  . Osteoarthritis of right lower extremity 12/08/2016  . Overweight (BMI 25.0-29.9) 12/08/2016  . Healthcare maintenance 09/17/2015  . Seizures ([place])   . Hyperlipidemia 05/23/2015  . Essential hypertension 05/22/2015   PCP:  Axel Filler, MD Pharmacy:   [facility] [address] - [place], [place] [address] [place] [address] Phone: [phone] Fax: [phone]  [place], [place] [address] Phone: [phone] Fax: [phone]  Social Determinants of Health (SDOH) Interventions    Readmission Risk Interventions No flowsheet data found.

## 2019-10-10 NOTE — Progress Notes (Signed)
NAME:  DEOLINDA FRID, MRN:  [id number], DOB:  Feb 09, 1941, LOS: 3 ADMISSION DATE:  10/06/2019  Subjective/Interm hx  No overnight events. Tmax 100.17F overnight R knee, left ankle, left thumb and right elbow pain.  Objective   Blood pressure 119/63, pulse 85, temperature 99.9 F (37.7 C), temperature source Oral, resp. rate 14, weight 51 kg, SpO2 96 %.     Intake/Output Summary (Last 24 hours) at 10/10/2019 0531 Last data filed at 10/09/2019 2300 Gross per 24 hour  Intake --  Output 1150 ml  Net -1150 ml    Examination: GENERAL: in no acute distress CARDIAC: well perfused PULMONARY: breathing comfortably on room air. No accessory muscle use MSK:  -right knee: tenderness to palpation. Effusion present. ROM limited by pain.  -left ankle: no tenderness to palpation. No effusion. ROM limited by pain -right thumb: marked swelling and erythema primarily at the MP joint and less at the DIP. Pain with ROM. -right elbow: pain with palpation. Small effusion present. ROM limited by pain SKIN: above joints warm   Significant Diagnostic Tests:  3/15 L foot xray> Acute appearing slightly impacted fracture at the head of the fifth proximal phalanx 3/15 R foot xray>No acute osseous abnormality.  Arthritis at the first MTP joint 3/15 R knee xray> no acute osseous abnormality. Generalized soft tissue swelling with knee effusion  Micro Data:  R knee synovial fluid culture> NGTD; Gram stain neg--abundant PMNs  Blood cultures>NGTD  Antimicrobials:  Vancomycin 3/15> Rocephin 3/16>  Labs    CBC Latest Ref Rng & Units 10/09/2019 10/08/2019 10/07/2019  WBC 4.0 - 10.5 K/uL 19.7(H) 19.2(H) 22.9(H)  Hemoglobin 12.0 - 15.0 g/dL 9.0(L) 8.4(L) 8.6(L)  Hematocrit 36.0 - 46.0 % 27.9(L) 26.1(L) 26.7(L)  Platelets 150 - 400 K/uL 620(H) 520(H) 461(H)   BMP Latest Ref Rng & Units 10/09/2019 10/08/2019 10/07/2019  Glucose 70 - 99 mg/dL 109(H) 95 87  BUN 8 - 23 mg/dL 24(H) 29(H) 29(H)  Creatinine 0.44 -  1.00 mg/dL 1.42(H) 1.78(H) 2.18(H)  BUN/Creat Ratio 12 - 28 - - -  Sodium 135 - 145 mmol/L 138 137 135  Potassium 3.5 - 5.1 mmol/L 4.0 4.1 3.2(L)  Chloride 98 - 111 mmol/L 101 104 97(L)  CO2 22 - 32 mmol/L 23 23 22   Calcium 8.9 - 10.3 mg/dL 8.5(L) 8.6(L) 8.5(L)    Summary  CARLYN LEMKE is a 79 yo female who was admitted to IMTS on 3/15 for right knee pain suspected to be septic arthritis vs pseudogout.  Assessment & Plan:  Active Problems:   Stage III chronic kidney disease   Dementia (HCC)   Septic arthritis (HCC)   Pseudogout of knee, right  Polyarthritis involving right knee, right elbow, left thumb, left ankle Pseudogout vs septic arthritis -Synovial fluid with CCP crystals, WBC 36k. NGTD on culture. -Tmax 100.17F overnight.   -white count up this morning at 24k Assessment: unsure what is going on now with the new joint effusions. Could just be an osteoarthritis flair however that does not really fit her whole picture. The increase in her white count is also concerning. If this were a septic joint picture, I would suspect that we should be continuing to see a downtrend in her white count. Between that and the new polyarthritis, this brings up a consideration for an autoimmune type picture possibly. I still think pseudogout is more likely however. Plan Continue vancomycin (tx day #5) and rocephin (tx day #4) until cultures finalized. Will try to discuss with ortho  later today regarding their thoughts on this new joint involvement and persistent leukocytosis Follow blood/synovial cultures  Acute on chronic iron deficient anemia. Stable.  Hypertension.  Continue to hold home antihypertensives  AKI on CKD IIIB. BMP ordered.  Best practice:  CODE STATUS: FULL Diet: cardiac DVT for prophylaxis: lovenox Dispo: PT Recommending SNF which son and pt are agreeable to. TOC consult placed.   Mitzi Hansen, MD INTERNAL MEDICINE RESIDENT PGY-1 PAGER #: [phone] 10/10/19    5:31 AM

## 2019-10-10 NOTE — Plan of Care (Signed)
  Problem: Pain Managment: Goal: General experience of comfort will improve Outcome: Progressing   Problem: Safety: Goal: Ability to remain free from injury will improve Outcome: Progressing   Problem: Skin Integrity: Goal: Risk for impaired skin integrity will decrease Outcome: Progressing   

## 2019-10-10 NOTE — Progress Notes (Signed)
Physical Therapy Treatment Patient Details Name: Caitlin Cervantes MRN: [id number] DOB: June 09, 1941 Today's Date: 10/10/2019    History of Present Illness Patient is a 79 y/o female who presents with right knee pain and fever. Recent hospitalization 3/9-3/11 for severe anemia with gastric ulcers. Admitted with Septic arthritis vs pseudogout. PMH includes dementia, CKD, seizures, HTN, HLD.    PT Comments    Pt confused on arrival thinking it was night time, blinds opened and light on with pt reoriented. Pt reports feeling tight and stiff all over but demonstrates improved mobility and activity this session with ability to perform HEP, standing trials and OOB to chair. Pt without report of significant pain and needs encouragement to maximize function.     Follow Up Recommendations  SNF;Supervision for mobility/OOB     Equipment Recommendations  None recommended by PT    Recommendations for Other Services       Precautions / Restrictions Precautions Precautions: Fall    Mobility  Bed Mobility Overal bed mobility: Needs Assistance Bed Mobility: Rolling;Sidelying to Sit Rolling: Min assist Sidelying to sit: Min assist       General bed mobility comments: cues for sequence with increased time  Transfers Overall transfer level: Needs assistance   Transfers: Sit to/from Stand;Stand Pivot Transfers Sit to Stand: Mod assist Stand pivot transfers: Mod assist;+2 safety/equipment       General transfer comment: mod assist to rise from surface with cues for hand placement and increased time to rise from bed and from recliner. +2 safety to pivot with RW from bed to recliner  Ambulation/Gait                 Stairs             Wheelchair Mobility    Modified Rankin (Stroke Patients Only)       Balance Overall balance assessment: Needs assistance   Sitting balance-Leahy Scale: Fair Sitting balance - Comments: supervision for safety.   Standing balance  support: Bilateral upper extremity supported Standing balance-Leahy Scale: Poor Standing balance comment: bil UE support on RW in standing                            Cognition Arousal/Alertness: Awake/alert Behavior During Therapy: WFL for tasks assessed/performed Overall Cognitive Status: No family/caregiver present to determine baseline cognitive functioning Area of Impairment: Orientation;Memory;Following commands;Safety/judgement                 Orientation Level: Disoriented to;Time;Situation   Memory: Decreased short-term memory Following Commands: Follows one step commands inconsistently;Follows one step commands with increased time Safety/Judgement: Decreased awareness of deficits;Decreased awareness of safety            Exercises General Exercises - Lower Extremity Long Arc Quad: AROM;Both;Seated;10 reps Heel Slides: Both;5 reps;Supine;AAROM Hip ABduction/ADduction: Both;10 reps;Supine;AAROM Hip Flexion/Marching: AROM;Both;10 reps;Seated    General Comments        Pertinent Vitals/Pain Pain Assessment: Faces Faces Pain Scale: Hurts a little bit Pain Location: right hand and knees Pain Descriptors / Indicators: Aching;Sore Pain Intervention(s): Limited activity within patient's tolerance;Monitored during session;Repositioned;RN gave pain meds during session    Home Living                      Prior Function            PT Goals (current goals can now be found in the care plan section) Progress towards PT goals:  Progressing toward goals    Frequency    Min 2X/week      PT Plan Current plan remains appropriate    Co-evaluation              AM-PAC PT "6 Clicks" Mobility   Outcome Measure  Help needed turning from your back to your side while in a flat bed without using bedrails?: A Little Help needed moving from lying on your back to sitting on the side of a flat bed without using bedrails?: A Little Help needed  moving to and from a bed to a chair (including a wheelchair)?: A Lot Help needed standing up from a chair using your arms (e.g., wheelchair or bedside chair)?: A Lot Help needed to walk in hospital room?: Total Help needed climbing 3-5 steps with a railing? : Total 6 Click Score: 12    End of Session Equipment Utilized During Treatment: Gait belt Activity Tolerance: Patient tolerated treatment well Patient left: in chair;with call bell/phone within reach;with chair alarm set Nurse Communication: Mobility status PT Visit Diagnosis: Muscle weakness (generalized) (M62.81);Unsteadiness on feet (R26.81);Pain     Time: 6979-4801 PT Time Calculation (min) (ACUTE ONLY): 21 min  Charges:  $Therapeutic Activity: 8-22 mins                     Tymia Streb P, PT Acute Rehabilitation Services Pager: [phone] Office: [place] 10/10/2019, 1:35 PM

## 2019-10-10 NOTE — Plan of Care (Signed)

## 2019-10-10 NOTE — Progress Notes (Signed)
UPDATE NOTE:  Spoke with Elam Dutch from orthopedics regarding her case. He notes that this is most likely to be from pseudogout and notes that the polyarthritis is more common in pseudogout than true gout. Recommends d/c abx and start steroids.   Further discussed with attending. He agrees with this change however will additionally pursue an autoimmune workup to r/o other causes given her persistent leukocytosis and borderline temperature last night.

## 2019-10-10 NOTE — NC FL2 (Signed)
HOFF [place] MEDICAID FL2 LEVEL OF CARE SCREENING TOOL     IDENTIFICATION  Patient Name: Caitlin Cervantes Birthdate: 10-10-1940 Sex: female Admission Date (Current Location): 10/06/2019  [facility] [place] Number:  Herbalist and Address:  [facility] [place] [address], [place], [place] [address]      Provider Number: [id number]  Attending Physician Name and Address:  Lucious Groves, DO  Relative Name and Phone Number:  Faryn Sieg (WVP)[phone]    Current Level of Care: Hospital Recommended Level of Care: [place] Prior Approval Number:    Date Approved/Denied:   PASRR Number: [id number]  Discharge Plan: SNF    Current Diagnoses: Patient Active Problem List   Diagnosis Date Noted  . Pseudogout of knee, right 10/08/2019  . Hiatal hernia 10/06/2019  . Knee pain 10/06/2019  . Dementia ([place]) 10/06/2019  . Foot pain 10/06/2019  . Combined gastric and duodenal ulcer 10/06/2019  . Septic arthritis ([place]) 10/06/2019  . Acute renal failure superimposed on stage 3b chronic kidney disease ([place]) 09/30/2019  . Severe anemia 09/30/2019  . Increased anion gap metabolic acidosis 38/18/2993  . Leukocytosis 09/30/2019  . Iron deficiency anemia 09/30/2019  . Ischemic vascular disease 05/05/2019  . Stage III chronic kidney disease 05/22/2018  . Diastolic dysfunction 71/69/6789  . Osteoarthritis of right lower extremity 12/08/2016  . Overweight (BMI 25.0-29.9) 12/08/2016  . Healthcare maintenance 09/17/2015  . Seizures ([place])   . Hyperlipidemia 05/23/2015  . Essential hypertension 05/22/2015    Orientation RESPIRATION BLADDER Height & Weight     Self, Situation, Place  Normal Continent Weight: 51 kg Height:     BEHAVIORAL SYMPTOMS/MOOD NEUROLOGICAL BOWEL NUTRITION STATUS      Continent Diet(Refer to d/c summary)  AMBULATORY STATUS COMMUNICATION OF NEEDS Skin   Extensive Assist Verbally Normal                        Personal Care Assistance Level of Assistance  Bathing, Dressing, Feeding Bathing Assistance: Limited assistance Feeding assistance: Independent Dressing Assistance: Limited assistance     Functional Limitations Info  Sight, Hearing, Speech Sight Info: Adequate Hearing Info: Adequate Speech Info: Adequate    SPECIAL CARE FACTORS FREQUENCY  PT (By licensed PT), OT (By licensed OT)     PT Frequency: 5x/ week, evaluate and treat OT Frequency: 5x/ week, evaluate and treat            Contractures Contractures Info: Not present    Additional Factors Info  Code Status, Allergies Code Status Info: full code Allergies Info: No Known Allergies           Current Medications (10/10/2019):  This is the current hospital active medication list Current Facility-Administered Medications  Medication Dose Route Frequency Provider Last Rate Last Admin  . acetaminophen (TYLENOL) tablet 650 mg  650 mg Oral Q6H PRN Seawell, Jaimie A, DO   650 mg at 10/09/19 1827   Or  . acetaminophen (TYLENOL) suppository 650 mg  650 mg Rectal Q6H PRN Seawell, Jaimie A, DO      . atorvastatin (LIPITOR) tablet 40 mg  40 mg Oral q1800 Seawell, Jaimie A, DO   40 mg at 10/09/19 1615  . cefTRIAXone (ROCEPHIN) 1 g in sodium chloride 0.9 % 100 mL IVPB  1 g Intravenous Q24H Christian, Rylee, MD 200 mL/hr at 10/09/19 1441 1 g at 10/09/19 1441  . donepezil (ARICEPT) tablet 5 mg  5 mg Oral  QHS Seawell, Jaimie A, DO   5 mg at 10/09/19 2325  . enoxaparin (LOVENOX) injection 30 mg  30 mg Subcutaneous Q24H Seawell, Jaimie A, DO   30 mg at 10/09/19 1446  . levETIRAcetam (KEPPRA) tablet 500 mg  500 mg Oral BID Seawell, Jaimie A, DO   500 mg at 10/10/19 0900  . pantoprazole (PROTONIX) EC tablet 40 mg  40 mg Oral BID AC Seawell, Jaimie A, DO   40 mg at 10/10/19 0900  . senna-docusate (Senokot-S) tablet 1 tablet  1 tablet Oral QHS PRN Seawell, Jaimie A, DO      . vancomycin (VANCOCIN) IVPB 1000 mg/200 mL premix  1,000 mg  Intravenous Q48H Theotis Burrow, RPH   Stopped at 10/08/19 2131     Discharge Medications: Please see discharge summary for a list of discharge medications.  Relevant Imaging Results:  Relevant Lab Results:   Additional Information SS# [id number]  Sharin Mons, RN

## 2019-10-11 ENCOUNTER — Inpatient Hospital Stay (HOSPITAL_COMMUNITY): Payer: Medicare Other

## 2019-10-11 DIAGNOSIS — D509 Iron deficiency anemia, unspecified: Secondary | ICD-10-CM

## 2019-10-11 DIAGNOSIS — I129 Hypertensive chronic kidney disease with stage 1 through stage 4 chronic kidney disease, or unspecified chronic kidney disease: Secondary | ICD-10-CM

## 2019-10-11 DIAGNOSIS — R509 Fever, unspecified: Secondary | ICD-10-CM

## 2019-10-11 DIAGNOSIS — D473 Essential (hemorrhagic) thrombocythemia: Secondary | ICD-10-CM

## 2019-10-11 DIAGNOSIS — Z79899 Other long term (current) drug therapy: Secondary | ICD-10-CM

## 2019-10-11 DIAGNOSIS — I361 Nonrheumatic tricuspid (valve) insufficiency: Secondary | ICD-10-CM

## 2019-10-11 DIAGNOSIS — N179 Acute kidney failure, unspecified: Secondary | ICD-10-CM

## 2019-10-11 DIAGNOSIS — I340 Nonrheumatic mitral (valve) insufficiency: Secondary | ICD-10-CM

## 2019-10-11 DIAGNOSIS — N1832 Chronic kidney disease, stage 3b: Secondary | ICD-10-CM

## 2019-10-11 DIAGNOSIS — D72829 Elevated white blood cell count, unspecified: Secondary | ICD-10-CM

## 2019-10-11 LAB — HCV AB W REFLEX TO QUANT PCR: HCV Ab: <0.1 s/co ratio (ref 0.0–0.9)

## 2019-10-11 LAB — CBC
HCT: 27.8 % — ABNORMAL LOW (ref 36.0–46.0)
Hemoglobin: 9.1 g/dL — ABNORMAL LOW (ref 12.0–15.0)
MCH: 27.2 pg (ref 26.0–34.0)
MCHC: 32.7 g/dL (ref 30.0–36.0)
MCV: 83 fL (ref 80.0–100.0)
Platelets: 844 10*3/uL — ABNORMAL HIGH (ref 150–400)
RBC: 3.35 MIL/uL — ABNORMAL LOW (ref 3.87–5.11)
RDW: 18.1 % — ABNORMAL HIGH (ref 11.5–15.5)
WBC: 27.2 10*3/uL — ABNORMAL HIGH (ref 4.0–10.5)
nRBC: 0 % (ref 0.0–0.2)

## 2019-10-11 LAB — URINALYSIS, ROUTINE W REFLEX MICROSCOPIC
Bilirubin Urine: NEGATIVE
Glucose, UA: NEGATIVE mg/dL
Ketones, ur: 5 mg/dL — AB
Leukocytes,Ua: NEGATIVE
Nitrite: NEGATIVE
Protein, ur: NEGATIVE mg/dL
Specific Gravity, Urine: 1.017 (ref 1.005–1.030)
pH: 5 (ref 5.0–8.0)

## 2019-10-11 LAB — CULTURE, BLOOD (ROUTINE X 2)
Culture: NO GROWTH
Culture: NO GROWTH

## 2019-10-11 LAB — COMPREHENSIVE METABOLIC PANEL
ALT: 43 U/L (ref 0–44)
AST: 56 U/L — ABNORMAL HIGH (ref 15–41)
Albumin: 1.9 g/dL — ABNORMAL LOW (ref 3.5–5.0)
Alkaline Phosphatase: 110 U/L (ref 38–126)
Anion gap: 12 (ref 5–15)
BUN: 19 mg/dL (ref 8–23)
CO2: 24 mmol/L (ref 22–32)
Calcium: 8.8 mg/dL — ABNORMAL LOW (ref 8.9–10.3)
Chloride: 99 mmol/L (ref 98–111)
Creatinine, Ser: 1.17 mg/dL — ABNORMAL HIGH (ref 0.44–1.00)
GFR calc Af Amer: 52 mL/min — ABNORMAL LOW (ref >60)
GFR calc non Af Amer: 45 mL/min — ABNORMAL LOW (ref >60)
Glucose, Bld: 133 mg/dL — ABNORMAL HIGH (ref 70–99)
Potassium: 3.9 mmol/L (ref 3.5–5.1)
Sodium: 135 mmol/L (ref 135–145)
Total Bilirubin: 0.6 mg/dL (ref 0.3–1.2)
Total Protein: 6.3 g/dL — ABNORMAL LOW (ref 6.5–8.1)

## 2019-10-11 LAB — ECHOCARDIOGRAM COMPLETE
Height: 61 in
Weight: 1897.72 oz

## 2019-10-11 LAB — MAGNESIUM: Magnesium: 1.7 mg/dL (ref 1.7–2.4)

## 2019-10-11 LAB — ANA: Anti Nuclear Antibody (ANA): NEGATIVE

## 2019-10-11 LAB — RHEUMATOID FACTOR: Rheumatoid fact SerPl-aCnc: 21.8 IU/mL — ABNORMAL HIGH (ref 0.0–13.9)

## 2019-10-11 LAB — HCV INTERPRETATION

## 2019-10-11 LAB — HEPATITIS B SURFACE ANTIBODY, QUANTITATIVE: Hep B S AB Quant (Post): <3.1 m[IU]/mL — ABNORMAL LOW

## 2019-10-11 MED ORDER — PREDNISONE 20 MG PO TABS
20.0000 mg | ORAL_TABLET | Freq: Once | ORAL | Status: AC
  Administered 2019-10-11: 20 mg via ORAL
  Filled 2019-10-11: qty 1

## 2019-10-11 MED ORDER — HYDROCODONE-ACETAMINOPHEN 5-325 MG PO TABS
1.0000 | ORAL_TABLET | Freq: Four times a day (QID) | ORAL | Status: DC | PRN
  Administered 2019-10-11: 1 via ORAL
  Filled 2019-10-11: qty 1

## 2019-10-11 MED ORDER — PREDNISONE 20 MG PO TABS
60.0000 mg | ORAL_TABLET | Freq: Every day | ORAL | Status: DC
  Administered 2019-10-12 – 2019-10-13 (×2): 60 mg via ORAL
  Filled 2019-10-11 (×2): qty 3

## 2019-10-11 NOTE — Progress Notes (Signed)
  Echocardiogram 2D Echocardiogram has been performed.  Caitlin Cervantes 10/11/2019, 11:26 AM

## 2019-10-11 NOTE — Plan of Care (Signed)
  Problem: Nutrition: Goal: Adequate nutrition will be maintained 10/11/2019 0907 by Rodell Perna, RN Outcome: Progressing 10/11/2019 0906 by Rodell Perna, RN Outcome: Progressing   Problem: Pain Managment: Goal: General experience of comfort will improve 10/11/2019 0907 by Rodell Perna, RN Outcome: Progressing 10/11/2019 0906 by Rodell Perna, RN Outcome: Progressing   Problem: Safety: Goal: Ability to remain free from injury will improve 10/11/2019 0907 by Rodell Perna, RN Outcome: Progressing 10/11/2019 0906 by Rodell Perna, RN Outcome: Progressing

## 2019-10-11 NOTE — Plan of Care (Signed)
  Problem: Activity: Goal: Risk for activity intolerance will decrease Outcome: Progressing   

## 2019-10-11 NOTE — TOC Progression Note (Signed)
Transition of Care [facility]) - Progression Note    Patient Details  Name: Caitlin Cervantes MRN: [id number] Date of Birth: 1941-04-05  Transition of Care [facility]) CM/SW [place], [place] Phone Number: [phone] 10/11/2019, 2:51 PM  Clinical Narrative:     CSW met with patient and patient's son to discuss the bed offers. CSW provided them with the medicare.gov rating list. Patient's son did research some of th facilities and they chose [facility]>  CSW spoke with Juliann Pulse at [facility] and she stated that they had a bed available however would need updated COVID.  CSW messaged MD [place] that new COVID was needed.  Expected Discharge Plan: Skilled Nursing Facility Barriers to Discharge: Continued Medical Work up  Expected Discharge Plan and Services Expected Discharge Plan: [place]                                               Social Determinants of Health (SDOH) Interventions    Readmission Risk Interventions No flowsheet data found.

## 2019-10-11 NOTE — Plan of Care (Signed)
  Problem: Nutrition: Goal: Adequate nutrition will be maintained Outcome: Progressing   Problem: Pain Managment: Goal: General experience of comfort will improve Outcome: Progressing   

## 2019-10-11 NOTE — Progress Notes (Signed)
NAME:  Caitlin Cervantes, MRN:  [id number], DOB:  March 23, 1941, LOS: 4 ADMISSION DATE:  10/06/2019  Subjective/Interm hx  No overnight events. Afebrile. No improvement in pain this morning  Objective   Blood pressure (!) 153/77, pulse 91, temperature 98.7 F (37.1 C), temperature source Oral, resp. rate 16, weight 54.3 kg, SpO2 100 %.     Intake/Output Summary (Last 24 hours) at 10/11/2019 0455 Last data filed at 10/11/2019 0300 Gross per 24 hour  Intake 360 ml  Output 501 ml  Net -141 ml    Examination: GENERAL: in no acute distress CARDIAC: RRR. Murmur appreciated on LSB PULMONARY: breathing comfortably on room air. No accessory muscle use MSK:  -right knee: nontender to palpation this morning. Effusion present. ROM limited by pain.  -left ankle: no tenderness to palpation. Moderate lateral effusion. ROM improved but still limited -right thumb: marked swelling and erythema primarily at the MP joint and less at the DIP. Pain with ROM and palpation over the MCP. DIP less tender -right elbow: pain with palpation. Small effusion present. Very limited ROM due to pain SKIN: above joints warm   Significant Diagnostic Tests:  3/15 L foot xray> Acute appearing slightly impacted fracture at the head of the fifth proximal phalanx 3/15 R foot xray>No acute osseous abnormality.  Arthritis at the first MTP joint 3/15 R knee xray> no acute osseous abnormality. Generalized soft tissue swelling with knee effusion  Micro Data:  R knee synovial fluid culture> NG; Gram stain neg--abundant PMNs  Blood cultures>NG  Antimicrobials:  Vancomycin 3/15>3/19 Rocephin 3/16>3/19  Labs    CBC Latest Ref Rng & Units 10/11/2019 10/10/2019 10/09/2019  WBC 4.0 - 10.5 K/uL 27.2(H) 24.5(H) 19.7(H)  Hemoglobin 12.0 - 15.0 g/dL 9.1(L) 9.2(L) 9.0(L)  Hematocrit 36.0 - 46.0 % 27.8(L) 28.7(L) 27.9(L)  Platelets 150 - 400 K/uL 844(H) 771(H) 620(H)   BMP Latest Ref Rng & Units 10/10/2019 10/09/2019 10/08/2019    Glucose 70 - 99 mg/dL 110(H) 109(H) 95  BUN 8 - 23 mg/dL 12 24(H) 29(H)  Creatinine 0.44 - 1.00 mg/dL 1.23(H) 1.42(H) 1.78(H)  BUN/Creat Ratio 12 - 28 - - -  Sodium 135 - 145 mmol/L 133(L) 138 137  Potassium 3.5 - 5.1 mmol/L 3.9 4.0 4.1  Chloride 98 - 111 mmol/L 98 101 104  CO2 22 - 32 mmol/L 19(L) 23 23  Calcium 8.9 - 10.3 mg/dL 8.7(L) 8.5(L) 8.6(L)    Summary  Caitlin Cervantes is a 79 yo female who was admitted to IMTS on 3/15 for right knee pain suspected to be autoimmune vs pseudogout.  Assessment & Plan:  Principal Problem:   Polyarthritis Active Problems:   Stage III chronic kidney disease   Dementia (HCC)   Septic arthritis (HCC)   Pseudogout of knee, right  Polyarthritis involving right knee, right elbow, left thumb, left ankle Pseudogout vs autoimmune Leukocytosis, thrombocytosis, fever, elevated ferritin -Synovial fluid with consistent with pseudogout, WBC 36k. NG on culture. -afebrile overnight  -white count continuing to trend up--27k this morning -rheum panel pending -ferritin 900--was 8 as of 10 days ago -HBV negative -urine not that remarkable overall; culture pending Assessment: overall still unsure what is causing the new joint effusions and increasing white count. Right knee aspirate consistent with pseduogout however unsure if we would expect this degree of leukocytosis and other findings with that. No growth on synovial and blood cultures makes infection unlikely. Prednisone started yesterday however white count was still significantly elevated even before that. The elevated ferritin  may be from recent blood transfusion however when put into the context of the white count, progressive thrombocytosis and fevers, it would suggest some kind of inflammatory issue going on.  Adult onset still's disease considered however would expect ferritin to be higher than it is. Plan Will increase prednisone to 60mg  daily--day #2/7 Follow rheum panel  Norco for pain  Acute  on chronic iron deficient anemia. Stable.  Hypertension.  Continue to hold home antihypertensives  AKI on CKD IIIB. Continuing to improve.  Best practice:  CODE STATUS: FULL Diet: cardiac DVT for prophylaxis: lovenox Dispo: SNF. Appreciate TOC's assistance. Will likely be able to discharge when bed is available with close follow up from PCP on effusions and lab results   Mitzi Hansen, MD [place] PGY-1 PAGER #: [phone] 10/11/19  4:55 AM

## 2019-10-11 NOTE — Progress Notes (Signed)
  Date: 10/11/2019  Patient name: Caitlin Cervantes  Medical record number: [id number]  Date of birth: Nov 21, 1940        I have seen and evaluated this patient and I have discussed the plan of care with the house staff. Please see Dr. Chase Picket note for complete details. I concur with her findings and plan.  We examined patient's skin today for signs of endocarditis (looking for a source of infectious polyarthritis).  TTE did not show any concerning findings for endocarditis.  She continues to have pain.  RF +.  Awaiting other labs.  Could certainly be related to pseudogout, though she has a significant # of joints affected.   Sid Falcon, MD 10/11/2019, 8:35 PM

## 2019-10-11 NOTE — Progress Notes (Signed)
UPDATE NOTE:  Pt's son, [name], updated via telephone. Discussed that we are leaning more towards some kind of inflammatory reaction rather than infection at this point.  He notes that he may be going back to work on Monday and that if he does not answer, will be ok to leave a messsage. All questions were answered and he is encouraged to call the pt's floor for any further questions/concerns.

## 2019-10-12 LAB — COMPREHENSIVE METABOLIC PANEL WITH GFR
ALT: 46 U/L — ABNORMAL HIGH (ref 0–44)
AST: 59 U/L — ABNORMAL HIGH (ref 15–41)
Albumin: 2 g/dL — ABNORMAL LOW (ref 3.5–5.0)
Alkaline Phosphatase: 118 U/L (ref 38–126)
Anion gap: 13 (ref 5–15)
BUN: 29 mg/dL — ABNORMAL HIGH (ref 8–23)
CO2: 22 mmol/L (ref 22–32)
Calcium: 9 mg/dL (ref 8.9–10.3)
Chloride: 99 mmol/L (ref 98–111)
Creatinine, Ser: 1.24 mg/dL — ABNORMAL HIGH (ref 0.44–1.00)
GFR calc Af Amer: 49 mL/min — ABNORMAL LOW
GFR calc non Af Amer: 42 mL/min — ABNORMAL LOW
Glucose, Bld: 125 mg/dL — ABNORMAL HIGH (ref 70–99)
Potassium: 4.1 mmol/L (ref 3.5–5.1)
Sodium: 134 mmol/L — ABNORMAL LOW (ref 135–145)
Total Bilirubin: 0.5 mg/dL (ref 0.3–1.2)
Total Protein: 6.8 g/dL (ref 6.5–8.1)

## 2019-10-12 LAB — CBC WITH DIFFERENTIAL/PLATELET
Abs Immature Granulocytes: 0.38 K/uL — ABNORMAL HIGH (ref 0.00–0.07)
Abs Immature Granulocytes: 0.31 10*3/uL — ABNORMAL HIGH (ref 0.00–0.07)
Basophils Absolute: 0 K/uL (ref 0.0–0.1)
Basophils Absolute: 0 10*3/uL (ref 0.0–0.1)
Basophils Relative: 0 %
Basophils Relative: 0 %
Eosinophils Absolute: 0 K/uL (ref 0.0–0.5)
Eosinophils Absolute: 0 10*3/uL (ref 0.0–0.5)
Eosinophils Relative: 0 %
Eosinophils Relative: 0 %
HCT: 28.7 % — ABNORMAL LOW (ref 36.0–46.0)
HCT: 27.9 % — ABNORMAL LOW (ref 36.0–46.0)
Hemoglobin: 9.1 g/dL — ABNORMAL LOW (ref 12.0–15.0)
Hemoglobin: 9.1 g/dL — ABNORMAL LOW (ref 12.0–15.0)
Immature Granulocytes: 1 %
Immature Granulocytes: 1 %
Lymphocytes Relative: 8 %
Lymphocytes Relative: 3 %
Lymphs Abs: 2.2 K/uL (ref 0.7–4.0)
Lymphs Abs: 0.8 10*3/uL (ref 0.7–4.0)
MCH: 26.5 pg (ref 26.0–34.0)
MCH: 27.6 pg (ref 26.0–34.0)
MCHC: 31.7 g/dL (ref 30.0–36.0)
MCHC: 32.6 g/dL (ref 30.0–36.0)
MCV: 83.4 fL (ref 80.0–100.0)
MCV: 84.5 fL (ref 80.0–100.0)
Monocytes Absolute: 2 K/uL — ABNORMAL HIGH (ref 0.1–1.0)
Monocytes Absolute: 1 10*3/uL (ref 0.1–1.0)
Monocytes Relative: 7 %
Monocytes Relative: 4 %
Neutro Abs: 24.7 K/uL — ABNORMAL HIGH (ref 1.7–7.7)
Neutro Abs: 23.3 10*3/uL — ABNORMAL HIGH (ref 1.7–7.7)
Neutrophils Relative %: 84 %
Neutrophils Relative %: 92 %
Platelets: 1039 K/uL (ref 150–400)
Platelets: 1101 10*3/uL (ref 150–400)
RBC: 3.44 MIL/uL — ABNORMAL LOW (ref 3.87–5.11)
RBC: 3.3 MIL/uL — ABNORMAL LOW (ref 3.87–5.11)
RDW: 18.2 % — ABNORMAL HIGH (ref 11.5–15.5)
RDW: 18.2 % — ABNORMAL HIGH (ref 11.5–15.5)
WBC: 29.4 K/uL — ABNORMAL HIGH (ref 4.0–10.5)
WBC: 25.4 10*3/uL — ABNORMAL HIGH (ref 4.0–10.5)
nRBC: 0.1 % (ref 0.0–0.2)
nRBC: 0.1 % (ref 0.0–0.2)

## 2019-10-12 LAB — URINE CULTURE: Culture: >=100000 — AB

## 2019-10-12 LAB — SAVE SMEAR(SSMR), FOR PROVIDER SLIDE REVIEW

## 2019-10-12 LAB — CYCLIC CITRUL PEPTIDE ANTIBODY, IGG/IGA: CCP Antibodies IgG/IgA: 7 units (ref 0–19)

## 2019-10-12 NOTE — Progress Notes (Signed)
  Date: 10/12/2019  Patient name: Caitlin Cervantes  Medical record number: [id number]  Date of birth: [birth year]   This patient's plan of care was discussed with the house staff. Please see Dr. Aurelio Jew note for complete details. I concur with her findings.  We also discussed considering a paraneoplastic process given her inflammatory markers such as MDS, solid tumor.  Most likely she has pseudogout which is presenting in an unusual way.    Sid Falcon, MD 10/12/2019, 8:51 PM

## 2019-10-12 NOTE — Plan of Care (Signed)
  Problem: Safety: Goal: Ability to remain free from injury will improve Outcome: Progressing   

## 2019-10-12 NOTE — Progress Notes (Addendum)
Paged IM team about pt's second critical platelet lab value of 1,101. No new orders at this time. Notified social work about pt d/c to snf, per provider, will hold off on covid test for now.

## 2019-10-12 NOTE — TOC Progression Note (Signed)
Transition of Care [facility]) - Progression Note    Patient Details  Name: Caitlin Cervantes MRN: [id number] Date of Birth: July 17, 1941  Transition of Care [facility]) CM/SW [place], [place] Phone Number: [phone] 10/12/2019, 1:01 PM  Clinical Narrative:     CSW followed up with RN Murray Hodgkins to make sure that patient has a new COVID once patient in near being medically stable for discharge.   TOC team will continue to follow for discharge planning needs.   Expected Discharge Plan: Skilled Nursing Facility Barriers to Discharge: Continued Medical Work up  Expected Discharge Plan and Services Expected Discharge Plan: [place]                                               Social Determinants of Health (SDOH) Interventions    Readmission Risk Interventions No flowsheet data found.

## 2019-10-12 NOTE — Progress Notes (Signed)
   Subjective:   Her pain has started to improve. She is able to move her joints more today. She hasn't taken her pain medication yet.   Objective:  Vital signs in last 24 hours: Vitals:   10/11/19 1433 10/11/19 1946 10/12/19 0308 10/12/19 0319  BP: 132/80 129/77 (!) 162/80   Pulse: 81 89 83   Resp: 14     Temp: 98.9 F (37.2 C) 98.7 F (37.1 C) 98.5 F (36.9 C)   TempSrc: Oral Oral Oral   SpO2:  96% 95%   Weight:    53.1 kg  Height: 5\' 1"  (1.549 m)      Constitution: NAD, appears stated age Eyes: no icterus or injection  Cardio: II/VI LSB murmur, RRR, no LE edema Respiratory: CTA, no w/r/r Abdominal: NTTP, soft, non-distended  MSK: moving all extremities Neuro: normal affect, a&ox3 Skin: c/d/i   Assessment/Plan:  Principal Problem:   Polyarthritis Active Problems:   Stage III chronic kidney disease   Dementia (HCC)   Septic arthritis (HCC)   Pseudogout of knee, right  79yo female admitted with right knee pain after arthrocentesis with elevated neutrophilic WBC count, fevers, and CPPD crystals.   Polyarthritis 2/2 pseudogout vs. Autoimmune etiology Leukocytosis, thrombocytosis Initially thought to be secondary to septic arthritis, this now seems less likely with NG on synovial fluid culture or blood culture and white cell count continuing to trend upward with antibiotics. ECHO without vegetation and she has a history of murmur per chart review.  The pain has spread from her right knee and ankles to her left 5th MCP and right elbow. She was switched to prednisone two days ago with an increase in dose yesterday. Symptomatically she is showing signs of improvement with decrease in her pain and stiffness without medication prior to rounding. However thrombocytosis and leukocytosis with neutrophil predominance continue to increase. However, she also has not had recurrence of fever since switching to steroids.   - RF elevated at 21, CCP wnl, ANA nml, ferritin 900  -  afternoon CBC with diff - will need to monitor increasing thrombocytosis  - blood smear and smear review  - cont. Prednisone 60 mg qd  - cont. norco prn pain   HTN Holding home blood pressure medications   AKI on CKD IIIB Stable. Cont to monitor.   VTE: lovenox IVF: none Diet: regular Code: full   Dispo: Anticipated discharge pending clinical improvement.   Molli Hazard A, DO 10/12/2019, 7:56 AM Pager: [phone]

## 2019-10-12 NOTE — Progress Notes (Signed)
Paged IM team about pt's critical platelet lab value of 1,039. No new orders at this time.

## 2019-10-13 DIAGNOSIS — N1832 Chronic kidney disease, stage 3b: Secondary | ICD-10-CM | POA: Diagnosis present

## 2019-10-13 DIAGNOSIS — K625 Hemorrhage of anus and rectum: Secondary | ICD-10-CM | POA: Diagnosis not present

## 2019-10-13 DIAGNOSIS — Z9089 Acquired absence of other organs: Secondary | ICD-10-CM | POA: Diagnosis not present

## 2019-10-13 DIAGNOSIS — M11261 Other chondrocalcinosis, right knee: Secondary | ICD-10-CM | POA: Diagnosis not present

## 2019-10-13 DIAGNOSIS — R5383 Other fatigue: Secondary | ICD-10-CM | POA: Diagnosis not present

## 2019-10-13 DIAGNOSIS — Z8379 Family history of other diseases of the digestive system: Secondary | ICD-10-CM | POA: Diagnosis not present

## 2019-10-13 DIAGNOSIS — Z79899 Other long term (current) drug therapy: Secondary | ICD-10-CM | POA: Diagnosis not present

## 2019-10-13 DIAGNOSIS — Z20822 Contact with and (suspected) exposure to covid-19: Secondary | ICD-10-CM | POA: Diagnosis present

## 2019-10-13 DIAGNOSIS — Z806 Family history of leukemia: Secondary | ICD-10-CM | POA: Diagnosis not present

## 2019-10-13 DIAGNOSIS — N183 Chronic kidney disease, stage 3 unspecified: Secondary | ICD-10-CM | POA: Diagnosis not present

## 2019-10-13 DIAGNOSIS — Z836 Family history of other diseases of the respiratory system: Secondary | ICD-10-CM | POA: Diagnosis not present

## 2019-10-13 DIAGNOSIS — D509 Iron deficiency anemia, unspecified: Secondary | ICD-10-CM | POA: Diagnosis not present

## 2019-10-13 DIAGNOSIS — M109 Gout, unspecified: Secondary | ICD-10-CM | POA: Diagnosis not present

## 2019-10-13 DIAGNOSIS — M1120 Other chondrocalcinosis, unspecified site: Secondary | ICD-10-CM | POA: Diagnosis not present

## 2019-10-13 DIAGNOSIS — M2550 Pain in unspecified joint: Secondary | ICD-10-CM | POA: Diagnosis not present

## 2019-10-13 DIAGNOSIS — L8990 Pressure ulcer of unspecified site, unspecified stage: Secondary | ICD-10-CM | POA: Insufficient documentation

## 2019-10-13 DIAGNOSIS — Z9889 Other specified postprocedural states: Secondary | ICD-10-CM | POA: Diagnosis not present

## 2019-10-13 DIAGNOSIS — K219 Gastro-esophageal reflux disease without esophagitis: Secondary | ICD-10-CM | POA: Diagnosis not present

## 2019-10-13 DIAGNOSIS — K5790 Diverticulosis of intestine, part unspecified, without perforation or abscess without bleeding: Secondary | ICD-10-CM | POA: Diagnosis not present

## 2019-10-13 DIAGNOSIS — I10 Essential (primary) hypertension: Secondary | ICD-10-CM | POA: Diagnosis not present

## 2019-10-13 DIAGNOSIS — R000 Tachycardia, unspecified: Secondary | ICD-10-CM | POA: Diagnosis not present

## 2019-10-13 DIAGNOSIS — Z8611 Personal history of tuberculosis: Secondary | ICD-10-CM | POA: Diagnosis not present

## 2019-10-13 DIAGNOSIS — L98419 Non-pressure chronic ulcer of buttock with unspecified severity: Secondary | ICD-10-CM | POA: Diagnosis not present

## 2019-10-13 DIAGNOSIS — R569 Unspecified convulsions: Secondary | ICD-10-CM | POA: Diagnosis not present

## 2019-10-13 DIAGNOSIS — K5900 Constipation, unspecified: Secondary | ICD-10-CM | POA: Diagnosis not present

## 2019-10-13 DIAGNOSIS — D72829 Elevated white blood cell count, unspecified: Secondary | ICD-10-CM | POA: Diagnosis not present

## 2019-10-13 DIAGNOSIS — Z9851 Tubal ligation status: Secondary | ICD-10-CM | POA: Diagnosis not present

## 2019-10-13 DIAGNOSIS — F331 Major depressive disorder, recurrent, moderate: Secondary | ICD-10-CM | POA: Diagnosis not present

## 2019-10-13 DIAGNOSIS — Z8261 Family history of arthritis: Secondary | ICD-10-CM | POA: Diagnosis not present

## 2019-10-13 DIAGNOSIS — K5730 Diverticulosis of large intestine without perforation or abscess without bleeding: Secondary | ICD-10-CM | POA: Diagnosis present

## 2019-10-13 DIAGNOSIS — Z03818 Encounter for observation for suspected exposure to other biological agents ruled out: Secondary | ICD-10-CM | POA: Diagnosis not present

## 2019-10-13 DIAGNOSIS — R52 Pain, unspecified: Secondary | ICD-10-CM | POA: Diagnosis not present

## 2019-10-13 DIAGNOSIS — F0390 Unspecified dementia without behavioral disturbance: Secondary | ICD-10-CM | POA: Diagnosis present

## 2019-10-13 DIAGNOSIS — F0391 Unspecified dementia with behavioral disturbance: Secondary | ICD-10-CM | POA: Diagnosis not present

## 2019-10-13 DIAGNOSIS — G40909 Epilepsy, unspecified, not intractable, without status epilepticus: Secondary | ICD-10-CM | POA: Diagnosis present

## 2019-10-13 DIAGNOSIS — M4125 Other idiopathic scoliosis, thoracolumbar region: Secondary | ICD-10-CM | POA: Diagnosis present

## 2019-10-13 DIAGNOSIS — D62 Acute posthemorrhagic anemia: Secondary | ICD-10-CM | POA: Diagnosis present

## 2019-10-13 DIAGNOSIS — K269 Duodenal ulcer, unspecified as acute or chronic, without hemorrhage or perforation: Secondary | ICD-10-CM | POA: Diagnosis not present

## 2019-10-13 DIAGNOSIS — K264 Chronic or unspecified duodenal ulcer with hemorrhage: Secondary | ICD-10-CM | POA: Diagnosis present

## 2019-10-13 DIAGNOSIS — Z902 Acquired absence of lung [part of]: Secondary | ICD-10-CM | POA: Diagnosis not present

## 2019-10-13 DIAGNOSIS — R58 Hemorrhage, not elsewhere classified: Secondary | ICD-10-CM | POA: Diagnosis not present

## 2019-10-13 DIAGNOSIS — K259 Gastric ulcer, unspecified as acute or chronic, without hemorrhage or perforation: Secondary | ICD-10-CM | POA: Diagnosis not present

## 2019-10-13 DIAGNOSIS — R531 Weakness: Secondary | ICD-10-CM | POA: Diagnosis not present

## 2019-10-13 DIAGNOSIS — M25569 Pain in unspecified knee: Secondary | ICD-10-CM | POA: Diagnosis not present

## 2019-10-13 DIAGNOSIS — M6281 Muscle weakness (generalized): Secondary | ICD-10-CM | POA: Diagnosis not present

## 2019-10-13 DIAGNOSIS — R627 Adult failure to thrive: Secondary | ICD-10-CM | POA: Diagnosis not present

## 2019-10-13 DIAGNOSIS — Z8669 Personal history of other diseases of the nervous system and sense organs: Secondary | ICD-10-CM | POA: Diagnosis not present

## 2019-10-13 DIAGNOSIS — M1909 Primary osteoarthritis, other specified site: Secondary | ICD-10-CM | POA: Diagnosis present

## 2019-10-13 DIAGNOSIS — R5381 Other malaise: Secondary | ICD-10-CM | POA: Diagnosis not present

## 2019-10-13 DIAGNOSIS — R0902 Hypoxemia: Secondary | ICD-10-CM | POA: Diagnosis not present

## 2019-10-13 DIAGNOSIS — Z7401 Bed confinement status: Secondary | ICD-10-CM | POA: Diagnosis not present

## 2019-10-13 DIAGNOSIS — D500 Iron deficiency anemia secondary to blood loss (chronic): Secondary | ICD-10-CM | POA: Diagnosis not present

## 2019-10-13 DIAGNOSIS — Z8249 Family history of ischemic heart disease and other diseases of the circulatory system: Secondary | ICD-10-CM | POA: Diagnosis not present

## 2019-10-13 DIAGNOSIS — E785 Hyperlipidemia, unspecified: Secondary | ICD-10-CM | POA: Diagnosis present

## 2019-10-13 DIAGNOSIS — I959 Hypotension, unspecified: Secondary | ICD-10-CM | POA: Diagnosis present

## 2019-10-13 DIAGNOSIS — K449 Diaphragmatic hernia without obstruction or gangrene: Secondary | ICD-10-CM | POA: Diagnosis present

## 2019-10-13 DIAGNOSIS — M130 Polyarthritis, unspecified: Secondary | ICD-10-CM | POA: Diagnosis not present

## 2019-10-13 DIAGNOSIS — K922 Gastrointestinal hemorrhage, unspecified: Secondary | ICD-10-CM | POA: Diagnosis not present

## 2019-10-13 DIAGNOSIS — I129 Hypertensive chronic kidney disease with stage 1 through stage 4 chronic kidney disease, or unspecified chronic kidney disease: Secondary | ICD-10-CM | POA: Diagnosis present

## 2019-10-13 DIAGNOSIS — K254 Chronic or unspecified gastric ulcer with hemorrhage: Secondary | ICD-10-CM | POA: Diagnosis present

## 2019-10-13 DIAGNOSIS — F339 Major depressive disorder, recurrent, unspecified: Secondary | ICD-10-CM | POA: Diagnosis not present

## 2019-10-13 LAB — BASIC METABOLIC PANEL
Anion gap: 11 (ref 5–15)
BUN: 32 mg/dL — ABNORMAL HIGH (ref 8–23)
CO2: 25 mmol/L (ref 22–32)
Calcium: 8.8 mg/dL — ABNORMAL LOW (ref 8.9–10.3)
Chloride: 100 mmol/L (ref 98–111)
Creatinine, Ser: 1.28 mg/dL — ABNORMAL HIGH (ref 0.44–1.00)
GFR calc Af Amer: 47 mL/min — ABNORMAL LOW (ref >60)
GFR calc non Af Amer: 41 mL/min — ABNORMAL LOW (ref >60)
Glucose, Bld: 108 mg/dL — ABNORMAL HIGH (ref 70–99)
Potassium: 3.8 mmol/L (ref 3.5–5.1)
Sodium: 136 mmol/L (ref 135–145)

## 2019-10-13 LAB — CBC
HCT: 26.2 % — ABNORMAL LOW (ref 36.0–46.0)
Hemoglobin: 8.4 g/dL — ABNORMAL LOW (ref 12.0–15.0)
MCH: 27.2 pg (ref 26.0–34.0)
MCHC: 32.1 g/dL (ref 30.0–36.0)
MCV: 84.8 fL (ref 80.0–100.0)
Platelets: 1035 10*3/uL (ref 150–400)
RBC: 3.09 MIL/uL — ABNORMAL LOW (ref 3.87–5.11)
RDW: 18 % — ABNORMAL HIGH (ref 11.5–15.5)
WBC: 23.8 10*3/uL — ABNORMAL HIGH (ref 4.0–10.5)
nRBC: 0.1 % (ref 0.0–0.2)

## 2019-10-13 LAB — PROTIME-INR
INR: 1.1 (ref 0.8–1.2)
Prothrombin Time: 14.3 seconds (ref 11.4–15.2)

## 2019-10-13 LAB — SARS CORONAVIRUS 2 (TAT 6-24 HRS): SARS Coronavirus 2: NEGATIVE

## 2019-10-13 LAB — APTT: aPTT: 38 seconds — ABNORMAL HIGH (ref 24–36)

## 2019-10-13 LAB — PARVOVIRUS B19 ANTIBODY, IGG AND IGM
Parovirus B19 IgG Abs: 5.2 index — ABNORMAL HIGH (ref 0.0–0.8)
Parovirus B19 IgM Abs: 0.2 index (ref 0.0–0.8)

## 2019-10-13 MED ORDER — AMLODIPINE BESYLATE 2.5 MG PO TABS
2.5000 mg | ORAL_TABLET | Freq: Every day | ORAL | Status: DC
  Administered 2019-10-13: 2.5 mg via ORAL
  Filled 2019-10-13: qty 1

## 2019-10-13 MED ORDER — PREDNISONE 20 MG PO TABS: 60.0000 mg | ORAL_TABLET | Freq: Every day | ORAL | 0 refills | Status: AC

## 2019-10-13 NOTE — Discharge Summary (Addendum)
Name: KANESHA CADLE MRN: [id number] DOB: 1940/10/09 79 y.o. PCP: Axel Filler, MD  Date of Admission: 10/06/2019  5:44 PM Date of Discharge:  Attending Physician: Lucious Groves, DO  Discharge Diagnosis: 1. Acute exacerbation of Polyarthritis due to suspected pseudogout  Discharge Medications: Allergies as of 10/13/2019   No Known Allergies      Medication List     STOP taking these medications    aspirin EC 81 MG tablet   hydrochlorothiazide 25 MG tablet Commonly known as: HYDRODIURIL   lisinopril 40 MG tablet Commonly known as: ZESTRIL       TAKE these medications    amLODipine 2.5 MG tablet Commonly known as: NORVASC TAKE ONE TABLET BY MOUTH DAILY   atorvastatin 40 MG tablet Commonly known as: LIPITOR TAKE 1 TABLET (40 MG TOTAL) BY MOUTH DAILY AT 6 PM.   donepezil 5 MG tablet Commonly known as: Aricept Take 1 tablet (5 mg total) by mouth at bedtime.   iron polysaccharides 150 MG capsule Commonly known as: Nu-Iron Take 1 capsule (150 mg total) by mouth daily.   levETIRAcetam 500 MG tablet Commonly known as: KEPPRA TAKE ONE TABLET BY MOUTH TWICE A DAY   pantoprazole 40 MG tablet Commonly known as: PROTONIX Take 1 tablet (40 mg total) by mouth 2 (two) times daily before a meal.   predniSONE 20 MG tablet Commonly known as: DELTASONE Take 3 tablets (60 mg total) by mouth daily with breakfast for 5 days. Start taking on: October 14, 2019        Disposition and follow-up:   Ms.Ashlee SHARDAI STAR was discharged from [facility] in Stable condition.  At the hospital follow up visit please address:  1.  Asymmetric Polyarthritis due to suspected pseudogout. Right knee aspirate culture negative. +CCP crystals consistent with pseudogout. Will need outpatient rheumatology evaluation. Discharged to complete 7d course of prednisone 60mg  -Mild elevation in RF. ANA, anti ccp neg. -thrombocytosis (plat>54million), leukocytosis (24k).     2. Hypertension. She was fairly normotensive during hospitalization. Blood pressure did begin to trend up on days prior to discharge and home dose of 2.5mg  amlodipine was restarted. I did continue to hold her lisinopril and hctz at discharge. Please re-evaluate pressures at time of hospital follow up.  Labs / imaging needed at time of follow-up: CBC  Pending labs/ test needing follow-up: vWB assay  Follow-up Appointments: Rheumatology. PCP 3-5d. Contact information for after-discharge care     Destination     [facility] Preferred SNF .   Service: Skilled Nursing Contact information: [address] [place] Hospital Course: LAQUITA HARLAN is a 79 yo female who presented to the internal medicine clinic on 10/06/19 for right knee pain and bilateral ankle pain that began after being discharged from the hospital around 3/9 for severe anemia.  In the clinic, she was found to be febrile with a white count of 30k. Right knee effusion was aspirated and revealed CCP crystals indicating a pseudogout. Being unable to r/o septic arthritis, she was additionally started on vancomycin and rocephin was added the following day. Synovial fluid and blood cultures remained negative. White count continued to be significantly elevated and she remained febrile intermittently. By the time cultures finalized on 3/19, she had actually began to have more joint involvement including in her right elbow and left thumb. RF, ANA and anti-CCP  was collected. Antibiotics were discontinued due to low chance of infection and prednisone was initiated. After the prednisone was started, she improved fairly rapidly. RF was found to be mildly elevated at 22. ANA, anti-ccp were negative.  Discharge Vitals:   BP 130/69 (BP Location: Left Arm)   Pulse 82   Temp 98.6 F (37 C) (Oral)   Resp 17   Ht 5\' 1"  (1.549 m)   Wt 52.5 kg   LMP  (LMP Unknown)    SpO2 93%   BMI 21.87 kg/m   Pertinent Labs, Studies, and Procedures:  CBC Latest Ref Rng & Units 10/13/2019 10/12/2019 10/12/2019  WBC 4.0 - 10.5 K/uL 23.8(H) 25.4(H) 29.4(H)  Hemoglobin 12.0 - 15.0 g/dL 8.4(L) 9.1(L) 9.1(L)  Hematocrit 36.0 - 46.0 % 26.2(L) 27.9(L) 28.7(L)  Platelets 150 - 400 K/uL 1,035(HH) 1,101(HH) 1,039(HH)   BMP Latest Ref Rng & Units 10/13/2019 10/12/2019 10/11/2019  Glucose 70 - 99 mg/dL 108(H) 125(H) 133(H)  BUN 8 - 23 mg/dL 32(H) 29(H) 19  Creatinine 0.44 - 1.00 mg/dL 1.28(H) 1.24(H) 1.17(H)  BUN/Creat Ratio 12 - 28 - - -  Sodium 135 - 145 mmol/L 136 134(L) 135  Potassium 3.5 - 5.1 mmol/L 3.8 4.1 3.9  Chloride 98 - 111 mmol/L 100 99 99  CO2 22 - 32 mmol/L 25 22 24   Calcium 8.9 - 10.3 mg/dL 8.8(L) 9.0 8.8(L)      Signed: Mitzi Hansen, MD 10/13/2019, 2:46 PM   Pager: [phone]

## 2019-10-13 NOTE — Progress Notes (Signed)
UPDATE NOTE  Attempted to update son however no answer. HIPPA compliant VM left. Will try to reach out again tomorrow.

## 2019-10-13 NOTE — TOC Transition Note (Addendum)
Transition of Care [facility]) - CM/SW Discharge Note   Patient Details  Name: Caitlin Cervantes MRN: [id number] Date of Birth: 1941/01/21  [facility] CM/SW Contact:  Sharin Mons, RN Phone Number: [phone] 10/13/2019, 2:26 PM   Clinical Narrative:     Patient will DC to: SNF/ [facility] Anticipated DC date: 10/13/2019 Family notified:[name] ( son) Transport by: Corey Harold   Per MD patient ready for DC today once updated COVID results . RN, patient, patient's family, and facility notified of DC. Discharge Summary and FL2  will be sent to facility when available, MD aware.. RN to call report prior to discharge [phone]). Rm # [phone] A.DC packet on chart. Ambulance transport will be  requested for patient once COVID results.   NCM will continue to monitor.....  10/13/2019 @ 1705  NCM arranged  PT[facility] for  pt's transportation to [facility]. NCM made pt/ son aware.   Final next level of care: Skilled Nursing Facility([facility]) Barriers to Discharge: Other (comment)(COVID pending)   Patient Goals and CMS Choice     Discharge Placement     Discharge Plan and Services     Social Determinants of Health (SDOH) Interventions     Readmission Risk Interventions No flowsheet data found.

## 2019-10-13 NOTE — Plan of Care (Signed)

## 2019-10-13 NOTE — Progress Notes (Signed)
NAME:  Caitlin Cervantes, MRN:  [id number], DOB:  Aug 29, 1940, LOS: 6 ADMISSION DATE:  10/06/2019  Subjective/Interm hx  No overnight events.VSS Pt noting improvement in pain since starting steroids. Discussed that she should follow up with rheumatology outpatient  Objective   Blood pressure (!) 160/80, pulse 72, temperature 98 F (36.7 C), temperature source Oral, resp. rate 17, height 5\' 1"  (1.549 m), weight 52.5 kg, SpO2 100 %.     Intake/Output Summary (Last 24 hours) at 10/13/2019 0543 Last data filed at 10/12/2019 1900 Gross per 24 hour  Intake 240 ml  Output -  Net 240 ml    Examination: GENERAL: in no acute distress CARDIAC: RRR. Murmur appreciated on LSB PULMONARY: breathing comfortably on room air. No accessory muscle use MSK:  -right knee: nontender to palpation this morning. Effusion present. ROM improved from Saturday -left ankle: no tenderness to palpation. No pain with ROM -right thumb: swelling improved and erythema resolved. No pain to palpation -right elbow: no pain with palpation. Minimal pain with ROM this morning. SKIN: no joint warmth this morning   Significant Diagnostic Tests:  3/15 L foot xray> Acute appearing slightly impacted fracture at the head of the fifth proximal phalanx 3/15 R foot xray>No acute osseous abnormality.  Arthritis at the first MTP joint 3/15 R knee xray> no acute osseous abnormality. Generalized soft tissue swelling with knee effusion  Micro Data:  R knee synovial fluid culture> NG; Gram stain neg--abundant PMNs  Blood cultures>NG  Antimicrobials:  Vancomycin 3/15>3/19 Rocephin 3/16>3/19  Labs    CBC Latest Ref Rng & Units 10/12/2019 10/12/2019 10/11/2019  WBC 4.0 - 10.5 K/uL 25.4(H) 29.4(H) 27.2(H)  Hemoglobin 12.0 - 15.0 g/dL 9.1(L) 9.1(L) 9.1(L)  Hematocrit 36.0 - 46.0 % 27.9(L) 28.7(L) 27.8(L)  Platelets 150 - 400 K/uL 1,101(HH) 1,039(HH) 844(H)   BMP Latest Ref Rng & Units 10/12/2019 10/11/2019 10/10/2019  Glucose 70 -  99 mg/dL 125(H) 133(H) 110(H)  BUN 8 - 23 mg/dL 29(H) 19 12  Creatinine 0.44 - 1.00 mg/dL 1.24(H) 1.17(H) 1.23(H)  BUN/Creat Ratio 12 - 28 - - -  Sodium 135 - 145 mmol/L 134(L) 135 133(L)  Potassium 3.5 - 5.1 mmol/L 4.1 3.9 3.9  Chloride 98 - 111 mmol/L 99 99 98  CO2 22 - 32 mmol/L 22 24 19(L)  Calcium 8.9 - 10.3 mg/dL 9.0 8.8(L) 8.7(L)    Summary  Caitlin Cervantes is a 79 yo female who was admitted to [facility] on 3/15 for right knee pain suspected to be autoimmune vs pseudogout.  Assessment & Plan:  Principal Problem:   Polyarthritis Active Problems:   Stage III chronic kidney disease   Dementia (HCC)   Septic arthritis (HCC)   Pseudogout of knee, right  Polyarthritis involving right knee, right elbow, left thumb, left ankle Pseudogout vs autoimmune Leukocytosis, thrombocytosis, fever, elevated ferritin -Synovial fluid with consistent with pseudogout, WBC 36k. NG on culture. -HBV negative -UC with significant yeast -RF 22, ANA neg, anti CCP neg. ferritin 900--was 8 as of 10 days ago Assessment: overall, significant improvement clinically Saturday. Plan Will increase prednisone to 60mg  daily--day #4/7 Norco for pain  Acute on chronic iron deficient anemia. Stable.  Hypertension.  Home amlodipine dose restarted. Will evaluate over tonight and consider restarting the lisinopril   AKI on CKD IIIB. Renal function stable.  Best practice:  CODE STATUS: FULL Diet: cardiac DVT for prophylaxis: lovenox Dispo: medically stable for discharge to SNF. Will need rheum follow up outpatient   [place],  MD INTERNAL MEDICINE RESIDENT PGY-1 PAGER #: [phone] 10/13/19  5:43 AM

## 2019-10-13 NOTE — Plan of Care (Addendum)
Pt discharging to SNF, called report to Mark, Therapist, sports at [phone]. Discharge instructions given to pt and pt stated understanding. All personal belongings returned to pt. No questions or concerns voiced at this time. Pending PTAR for transport to pickup.   Problem: Education: Goal: Knowledge of General Education information will improve Description: Including pain rating scale, medication(s)/side effects and non-pharmacologic comfort measures 10/13/2019 1712 by Stevan Born, RN Outcome: Completed/Met 10/13/2019 1145 by Stevan Born, RN Outcome: Progressing   Problem: Health Behavior/Discharge Planning: Goal: Ability to manage health-related needs will improve 10/13/2019 1712 by Stevan Born, RN Outcome: Completed/Met 10/13/2019 1145 by Stevan Born, RN Outcome: Progressing   Problem: Clinical Measurements: Goal: Ability to maintain clinical measurements within normal limits will improve Outcome: Completed/Met Goal: Will remain free from infection 10/13/2019 1712 by Stevan Born, RN Outcome: Completed/Met 10/13/2019 1145 by Stevan Born, RN Outcome: Progressing Goal: Diagnostic test results will improve Outcome: Completed/Met Goal: Respiratory complications will improve Outcome: Completed/Met Goal: Cardiovascular complication will be avoided Outcome: Completed/Met   Problem: Activity: Goal: Risk for activity intolerance will decrease 10/13/2019 1712 by Stevan Born, RN Outcome: Completed/Met 10/13/2019 1145 by Stevan Born, RN Outcome: Progressing   Problem: Nutrition: Goal: Adequate nutrition will be maintained Outcome: Completed/Met   Problem: Coping: Goal: Level of anxiety will decrease Outcome: Completed/Met   Problem: Elimination: Goal: Will not experience complications related to bowel motility Outcome: Completed/Met Goal: Will not experience complications related to urinary retention Outcome: Completed/Met   Problem: Pain Managment: Goal:  General experience of comfort will improve 10/13/2019 1712 by Stevan Born, RN Outcome: Completed/Met 10/13/2019 1145 by Stevan Born, RN Outcome: Progressing   Problem: Safety: Goal: Ability to remain free from injury will improve 10/13/2019 1712 by Stevan Born, RN Outcome: Completed/Met 10/13/2019 1145 by Stevan Born, RN Outcome: Progressing   Problem: Skin Integrity: Goal: Risk for impaired skin integrity will decrease 10/13/2019 1712 by Stevan Born, RN Outcome: Completed/Met 10/13/2019 1145 by Stevan Born, RN Outcome: Progressing

## 2019-10-13 NOTE — Progress Notes (Signed)
Transport here to pick up Caitlin Cervantes notified.

## 2019-10-13 NOTE — Plan of Care (Signed)

## 2019-10-14 LAB — PATHOLOGIST SMEAR REVIEW

## 2019-10-15 LAB — FACTOR 8 RISTOCETIN COFACTOR: Ristocetin Co-factor, Plasma: 201 % — ABNORMAL HIGH (ref 50–200)

## 2019-10-15 LAB — VON WILLEBRAND ANTIGEN: Von Willebrand Antigen, Plasma: >597 % — ABNORMAL HIGH (ref 50–200)

## 2019-10-15 LAB — FACTOR 8 ASSAY: Coagulation Factor VIII: 377 % — ABNORMAL HIGH (ref 56–140)

## 2019-10-16 DIAGNOSIS — L98419 Non-pressure chronic ulcer of buttock with unspecified severity: Secondary | ICD-10-CM | POA: Diagnosis not present

## 2019-10-16 DIAGNOSIS — M109 Gout, unspecified: Secondary | ICD-10-CM | POA: Diagnosis not present

## 2019-10-16 DIAGNOSIS — R531 Weakness: Secondary | ICD-10-CM | POA: Diagnosis not present

## 2019-10-16 DIAGNOSIS — I10 Essential (primary) hypertension: Secondary | ICD-10-CM | POA: Diagnosis not present

## 2019-10-16 LAB — CULTURE, BLOOD (ROUTINE X 2)
Culture: NO GROWTH
Culture: NO GROWTH
Special Requests: ADEQUATE
Special Requests: ADEQUATE

## 2019-10-17 ENCOUNTER — Other Ambulatory Visit: Payer: Self-pay | Admitting: *Deleted

## 2019-10-17 NOTE — Patient Outreach (Signed)
Member screened for potential [facility] Care Management needs as a benefit of [place] [employer].  Member is currently receiving skilled therapy at [facility].  Update received from facility SW. Member will return home with family post SNF dc.   Member's PCP, Dr. Evette Doffing is with [facility] Management team. Will make [facility] aware writer is following while in SNF.   Will continue to follow along for transition plans. Will plan outreach to family when appropriate.   Marthenia Rolling, MSN-Ed, RN,BSN [facility] Coordinator [phone] [facility]) [phone]  (Toll free office)

## 2019-10-23 DIAGNOSIS — L98419 Non-pressure chronic ulcer of buttock with unspecified severity: Secondary | ICD-10-CM | POA: Diagnosis not present

## 2019-10-27 ENCOUNTER — Encounter (HOSPITAL_COMMUNITY): Payer: Self-pay | Admitting: Emergency Medicine

## 2019-10-27 ENCOUNTER — Inpatient Hospital Stay (HOSPITAL_COMMUNITY)
Admission: EM | Admit: 2019-10-27 | Discharge: 2019-10-30 | DRG: 378 | Disposition: A | Discharge status: Skilled Nursing Facility | Payer: Medicare Other | Source: Skilled Nursing Facility | Attending: Internal Medicine | Admitting: Internal Medicine

## 2019-10-27 ENCOUNTER — Emergency Department (HOSPITAL_COMMUNITY): Payer: Medicare Other

## 2019-10-27 ENCOUNTER — Other Ambulatory Visit: Payer: Self-pay

## 2019-10-27 DIAGNOSIS — Z902 Acquired absence of lung [part of]: Secondary | ICD-10-CM | POA: Diagnosis not present

## 2019-10-27 DIAGNOSIS — Z79899 Other long term (current) drug therapy: Secondary | ICD-10-CM

## 2019-10-27 DIAGNOSIS — Z8669 Personal history of other diseases of the nervous system and sense organs: Secondary | ICD-10-CM | POA: Diagnosis not present

## 2019-10-27 DIAGNOSIS — I959 Hypotension, unspecified: Secondary | ICD-10-CM | POA: Diagnosis present

## 2019-10-27 DIAGNOSIS — R0902 Hypoxemia: Secondary | ICD-10-CM | POA: Diagnosis not present

## 2019-10-27 DIAGNOSIS — L98419 Non-pressure chronic ulcer of buttock with unspecified severity: Secondary | ICD-10-CM | POA: Diagnosis not present

## 2019-10-27 DIAGNOSIS — N1832 Chronic kidney disease, stage 3b: Secondary | ICD-10-CM | POA: Diagnosis present

## 2019-10-27 DIAGNOSIS — K922 Gastrointestinal hemorrhage, unspecified: Secondary | ICD-10-CM | POA: Diagnosis present

## 2019-10-27 DIAGNOSIS — Z9089 Acquired absence of other organs: Secondary | ICD-10-CM

## 2019-10-27 DIAGNOSIS — I129 Hypertensive chronic kidney disease with stage 1 through stage 4 chronic kidney disease, or unspecified chronic kidney disease: Secondary | ICD-10-CM | POA: Diagnosis present

## 2019-10-27 DIAGNOSIS — K264 Chronic or unspecified duodenal ulcer with hemorrhage: Secondary | ICD-10-CM | POA: Diagnosis not present

## 2019-10-27 DIAGNOSIS — Z7401 Bed confinement status: Secondary | ICD-10-CM | POA: Diagnosis not present

## 2019-10-27 DIAGNOSIS — Z8249 Family history of ischemic heart disease and other diseases of the circulatory system: Secondary | ICD-10-CM | POA: Diagnosis not present

## 2019-10-27 DIAGNOSIS — R52 Pain, unspecified: Secondary | ICD-10-CM | POA: Diagnosis not present

## 2019-10-27 DIAGNOSIS — Z836 Family history of other diseases of the respiratory system: Secondary | ICD-10-CM

## 2019-10-27 DIAGNOSIS — R58 Hemorrhage, not elsewhere classified: Secondary | ICD-10-CM | POA: Diagnosis not present

## 2019-10-27 DIAGNOSIS — D500 Iron deficiency anemia secondary to blood loss (chronic): Secondary | ICD-10-CM | POA: Diagnosis not present

## 2019-10-27 DIAGNOSIS — K254 Chronic or unspecified gastric ulcer with hemorrhage: Secondary | ICD-10-CM | POA: Diagnosis present

## 2019-10-27 DIAGNOSIS — Z9889 Other specified postprocedural states: Secondary | ICD-10-CM | POA: Diagnosis not present

## 2019-10-27 DIAGNOSIS — K625 Hemorrhage of anus and rectum: Secondary | ICD-10-CM | POA: Diagnosis not present

## 2019-10-27 DIAGNOSIS — M2550 Pain in unspecified joint: Secondary | ICD-10-CM | POA: Diagnosis not present

## 2019-10-27 DIAGNOSIS — N183 Chronic kidney disease, stage 3 unspecified: Secondary | ICD-10-CM | POA: Diagnosis not present

## 2019-10-27 DIAGNOSIS — K5900 Constipation, unspecified: Secondary | ICD-10-CM | POA: Diagnosis not present

## 2019-10-27 DIAGNOSIS — Z8261 Family history of arthritis: Secondary | ICD-10-CM

## 2019-10-27 DIAGNOSIS — R5381 Other malaise: Secondary | ICD-10-CM | POA: Diagnosis not present

## 2019-10-27 DIAGNOSIS — M6281 Muscle weakness (generalized): Secondary | ICD-10-CM | POA: Diagnosis not present

## 2019-10-27 DIAGNOSIS — D62 Acute posthemorrhagic anemia: Secondary | ICD-10-CM | POA: Diagnosis present

## 2019-10-27 DIAGNOSIS — M1909 Primary osteoarthritis, other specified site: Secondary | ICD-10-CM | POA: Diagnosis present

## 2019-10-27 DIAGNOSIS — M1120 Other chondrocalcinosis, unspecified site: Secondary | ICD-10-CM | POA: Diagnosis not present

## 2019-10-27 DIAGNOSIS — Z03818 Encounter for observation for suspected exposure to other biological agents ruled out: Secondary | ICD-10-CM | POA: Diagnosis not present

## 2019-10-27 DIAGNOSIS — Z20822 Contact with and (suspected) exposure to covid-19: Secondary | ICD-10-CM | POA: Diagnosis present

## 2019-10-27 DIAGNOSIS — D72829 Elevated white blood cell count, unspecified: Secondary | ICD-10-CM | POA: Diagnosis not present

## 2019-10-27 DIAGNOSIS — K5790 Diverticulosis of intestine, part unspecified, without perforation or abscess without bleeding: Secondary | ICD-10-CM | POA: Diagnosis not present

## 2019-10-27 DIAGNOSIS — G40909 Epilepsy, unspecified, not intractable, without status epilepticus: Secondary | ICD-10-CM | POA: Diagnosis present

## 2019-10-27 DIAGNOSIS — Z8611 Personal history of tuberculosis: Secondary | ICD-10-CM

## 2019-10-27 DIAGNOSIS — Z806 Family history of leukemia: Secondary | ICD-10-CM

## 2019-10-27 DIAGNOSIS — Z8379 Family history of other diseases of the digestive system: Secondary | ICD-10-CM

## 2019-10-27 DIAGNOSIS — K5730 Diverticulosis of large intestine without perforation or abscess without bleeding: Secondary | ICD-10-CM | POA: Diagnosis present

## 2019-10-27 DIAGNOSIS — F0390 Unspecified dementia without behavioral disturbance: Secondary | ICD-10-CM | POA: Diagnosis present

## 2019-10-27 DIAGNOSIS — D509 Iron deficiency anemia, unspecified: Secondary | ICD-10-CM | POA: Diagnosis not present

## 2019-10-27 DIAGNOSIS — R000 Tachycardia, unspecified: Secondary | ICD-10-CM | POA: Diagnosis not present

## 2019-10-27 DIAGNOSIS — M4125 Other idiopathic scoliosis, thoracolumbar region: Secondary | ICD-10-CM | POA: Diagnosis present

## 2019-10-27 DIAGNOSIS — Z9851 Tubal ligation status: Secondary | ICD-10-CM | POA: Diagnosis not present

## 2019-10-27 DIAGNOSIS — K259 Gastric ulcer, unspecified as acute or chronic, without hemorrhage or perforation: Secondary | ICD-10-CM | POA: Diagnosis not present

## 2019-10-27 DIAGNOSIS — K269 Duodenal ulcer, unspecified as acute or chronic, without hemorrhage or perforation: Secondary | ICD-10-CM | POA: Diagnosis not present

## 2019-10-27 DIAGNOSIS — E785 Hyperlipidemia, unspecified: Secondary | ICD-10-CM | POA: Diagnosis present

## 2019-10-27 DIAGNOSIS — K449 Diaphragmatic hernia without obstruction or gangrene: Secondary | ICD-10-CM | POA: Diagnosis present

## 2019-10-27 LAB — CBC WITH DIFFERENTIAL/PLATELET
Abs Immature Granulocytes: 0.13 10*3/uL — ABNORMAL HIGH (ref 0.00–0.07)
Basophils Absolute: 0 10*3/uL (ref 0.0–0.1)
Basophils Relative: 0 %
Eosinophils Absolute: 0 10*3/uL (ref 0.0–0.5)
Eosinophils Relative: 0 %
HCT: 23.9 % — ABNORMAL LOW (ref 36.0–46.0)
Hemoglobin: 7.3 g/dL — ABNORMAL LOW (ref 12.0–15.0)
Immature Granulocytes: 1 %
Lymphocytes Relative: 8 %
Lymphs Abs: 1.4 10*3/uL (ref 0.7–4.0)
MCH: 27.4 pg (ref 26.0–34.0)
MCHC: 30.5 g/dL (ref 30.0–36.0)
MCV: 89.8 fL (ref 80.0–100.0)
Monocytes Absolute: 1.1 10*3/uL — ABNORMAL HIGH (ref 0.1–1.0)
Monocytes Relative: 7 %
Neutro Abs: 13.9 10*3/uL — ABNORMAL HIGH (ref 1.7–7.7)
Neutrophils Relative %: 84 %
Platelets: 318 10*3/uL (ref 150–400)
RBC: 2.66 MIL/uL — ABNORMAL LOW (ref 3.87–5.11)
RDW: 19.8 % — ABNORMAL HIGH (ref 11.5–15.5)
WBC: 16.6 10*3/uL — ABNORMAL HIGH (ref 4.0–10.5)
nRBC: 0 % (ref 0.0–0.2)

## 2019-10-27 LAB — COMPREHENSIVE METABOLIC PANEL
ALT: 19 U/L (ref 0–44)
AST: 26 U/L (ref 15–41)
Albumin: 2.1 g/dL — ABNORMAL LOW (ref 3.5–5.0)
Alkaline Phosphatase: 73 U/L (ref 38–126)
Anion gap: 12 (ref 5–15)
BUN: 18 mg/dL (ref 8–23)
CO2: 20 mmol/L — ABNORMAL LOW (ref 22–32)
Calcium: 7.8 mg/dL — ABNORMAL LOW (ref 8.9–10.3)
Chloride: 102 mmol/L (ref 98–111)
Creatinine, Ser: 1.23 mg/dL — ABNORMAL HIGH (ref 0.44–1.00)
GFR calc Af Amer: 49 mL/min — ABNORMAL LOW (ref >60)
GFR calc non Af Amer: 43 mL/min — ABNORMAL LOW (ref >60)
Glucose, Bld: 173 mg/dL — ABNORMAL HIGH (ref 70–99)
Potassium: 3.9 mmol/L (ref 3.5–5.1)
Sodium: 134 mmol/L — ABNORMAL LOW (ref 135–145)
Total Bilirubin: 0.8 mg/dL (ref 0.3–1.2)
Total Protein: 5.3 g/dL — ABNORMAL LOW (ref 6.5–8.1)

## 2019-10-27 LAB — POC OCCULT BLOOD, ED: Fecal Occult Bld: POSITIVE — AB

## 2019-10-27 LAB — PREPARE RBC (CROSSMATCH)

## 2019-10-27 LAB — LIPASE, BLOOD: Lipase: 31 U/L (ref 11–51)

## 2019-10-27 LAB — PROTIME-INR
INR: 1 (ref 0.8–1.2)
Prothrombin Time: 13.4 seconds (ref 11.4–15.2)

## 2019-10-27 LAB — RESPIRATORY PANEL BY RT PCR (FLU A&B, COVID)
Influenza A by PCR: NEGATIVE
Influenza B by PCR: NEGATIVE
SARS Coronavirus 2 by RT PCR: NEGATIVE

## 2019-10-27 MED ORDER — LEVETIRACETAM 500 MG PO TABS: 500.0000 mg | ORAL_TABLET | Freq: Two times a day (BID) | ORAL | Status: DC

## 2019-10-27 MED ORDER — DONEPEZIL HCL 5 MG PO TABS: 5.0000 mg | ORAL_TABLET | Freq: Every day | ORAL | Status: DC

## 2019-10-27 MED ORDER — PANTOPRAZOLE SODIUM 40 MG IV SOLR
40.0000 mg | Freq: Two times a day (BID) | INTRAVENOUS | Status: DC
  Administered 2019-10-28 – 2019-10-29 (×4): 40 mg via INTRAVENOUS
  Filled 2019-10-27 (×4): qty 40

## 2019-10-27 MED ORDER — LACTATED RINGERS IV SOLN: INTRAVENOUS | Status: AC

## 2019-10-27 MED ORDER — SODIUM CHLORIDE 0.9 % IV SOLN
80.0000 mg | Freq: Once | INTRAVENOUS | Status: AC
  Administered 2019-10-27: 80 mg via INTRAVENOUS
  Filled 2019-10-27: qty 80

## 2019-10-27 MED ORDER — SODIUM CHLORIDE 0.9 % IV SOLN
10.0000 mL/h | Freq: Once | INTRAVENOUS | Status: AC
  Administered 2019-10-28: 10 mL/h via INTRAVENOUS

## 2019-10-27 MED ORDER — LEVETIRACETAM IN NACL 500 MG/100ML IV SOLN
500.0000 mg | Freq: Two times a day (BID) | INTRAVENOUS | Status: DC
  Administered 2019-10-28 – 2019-10-30 (×5): 500 mg via INTRAVENOUS
  Filled 2019-10-27 (×7): qty 100

## 2019-10-27 NOTE — ED Provider Notes (Signed)
Emergency Department Provider Note   I have reviewed the triage vital signs and the nursing notes.   HISTORY  Chief Complaint Rectal Bleeding   HPI Caitlin Cervantes is a 79 y.o. female with PMH of CKD, HTN, HLD, anemia, PUD presents to the emergency department with passage of blood clots with BRB starting today.  Patient has had 2 recent admissions and is currently at a skilled nursing facility who noticed the bleeding today.  She is not anticoagulated.  She is not having symptoms such as fatigue, shortness of breath, abdominal pain.  She denies vomiting blood.  She was not aware that she was bleeding until it was noticed by her nurse today.   In review of the charts the patient had an admission in early March 2021 for acute blood loss anemia.  She had upper and lower endoscopy with multiple duodenal/gastric ulcers.  She received 3 units of PRBC with improved hemoglobin and was discharged home.  She has since had an admission for an inflammatory type arthritis and is completing a dose of steroid for that.   GI: [facility] - Dr. Therisa Doyne (endoscopy 10/02/19) PCP: Lalla Brothers, MD    Past Medical History:  Diagnosis Date   Anemia 0/03/3266   Diastolic dysfunction 08/17/5807   Grade I   Essential hypertension 05/22/2015   History of active tuberculosis 07/25/1947   s/p left lower lobectomy at age 38 and two years in a local [place] [facility]   Hyperlipidemia 05/23/2015   Idiopathic scoliosis of thoracolumbar spine 12/08/2016   Osteoarthritis of right lower extremity 12/08/2016   Overweight (BMI 25.0-29.9) 12/08/2016   Seasonal allergic rhinitis due to pollen 12/08/2016   Seizures ([place])    Possible seizure disorder marked by stareing events.   Stage III chronic kidney disease 05/22/2018    Patient Active Problem List   Diagnosis Date Noted   Acute GI bleeding 10/27/2019   Pressure injury of skin 10/13/2019   Polyarthritis 10/10/2019   Pseudogout of knee, right  10/08/2019   Hiatal hernia 10/06/2019   Knee pain 10/06/2019   Dementia ([place]) 10/06/2019   Foot pain 10/06/2019   Combined gastric and duodenal ulcer 10/06/2019   Acute renal failure superimposed on stage 3b chronic kidney disease ([place]) 09/30/2019   Increased anion gap metabolic acidosis 98/33/8250   Leukocytosis 09/30/2019   Iron deficiency anemia 09/30/2019   Ischemic vascular disease 05/05/2019   Stage III chronic kidney disease 53/97/6734   Diastolic dysfunction 19/37/9024   Osteoarthritis of right lower extremity 12/08/2016   Overweight (BMI 25.0-29.9) 12/08/2016   Healthcare maintenance 09/17/2015   Seizures ([place])    Hyperlipidemia 05/23/2015    Past Surgical History:  Procedure Laterality Date   BIOPSY  10/02/2019   Procedure: BIOPSY;  Surgeon: Ronnette Juniper, MD;  Location: [place];  Service: Gastroenterology;;   COLONOSCOPY WITH PROPOFOL N/A 10/02/2019   Procedure: COLONOSCOPY WITH PROPOFOL;  Surgeon: Ronnette Juniper, MD;  Location: [place];  Service: Gastroenterology;  Laterality: N/A;   ESOPHAGOGASTRODUODENOSCOPY (EGD) WITH PROPOFOL N/A 10/02/2019   Procedure: ESOPHAGOGASTRODUODENOSCOPY (EGD) WITH PROPOFOL;  Surgeon: Ronnette Juniper, MD;  Location: [place];  Service: Gastroenterology;  Laterality: N/A;   LUNG LOBECTOMY Left    TB   POLYPECTOMY  10/02/2019   Procedure: POLYPECTOMY;  Surgeon: Ronnette Juniper, MD;  Location: [place];  Service: Gastroenterology;;   TONSILLECTOMY     TUBAL LIGATION Bilateral     Allergies Patient has no known allergies.  Family History  Problem Relation Age  of Onset   Leukemia Mother 18   Hypertension Sister    Obstructive Sleep Apnea Sister    Osteoarthritis Sister    Hernia Sister        Umbilical   Lactose intolerance Sister    Healthy Son    Seizures Neg Hx     Social History Social History   Tobacco Use   Smoking status: Never Smoker   Smokeless tobacco: Never Used  Substance Use  Topics   Alcohol use: No    Alcohol/week: 0.0 standard drinks   Drug use: No    Review of Systems  Constitutional: No fever/chills Eyes: No visual changes. ENT: No sore throat. Cardiovascular: Denies chest pain. Respiratory: Denies shortness of breath. Gastrointestinal: No abdominal pain.  No nausea, no vomiting.  No diarrhea.  No constipation. Rectal bleeding.  Genitourinary: Negative for dysuria. Musculoskeletal: Negative for back pain. Skin: Negative for rash. Neurological: Negative for headaches, focal weakness or numbness.  10-point ROS otherwise negative.  ____________________________________________   PHYSICAL EXAM:  VITAL SIGNS: Vitals:   10/28/19 1030 10/28/19 1100  BP: 109/63 103/60  Pulse: 76 74  Resp: 19 20  Temp:    SpO2: 99% 98%    Constitutional: Alert and oriented. Well appearing and in no acute distress. Eyes: Conjunctivae are normal.  Head: Atraumatic. Nose: No congestion/rhinnorhea. Mouth/Throat: Mucous membranes are moist.  Neck: No stridor.   Cardiovascular: Tachycardia. Good peripheral circulation. Grossly normal heart sounds.   Respiratory: Normal respiratory effort.  No retractions. Lungs CTAB. Gastrointestinal: Soft and nontender. No distention.  Rectal exam performed with nurse chaperone and patient's verbal consent.  She has large volume clot in her diaper.  No gross melena. Musculoskeletal: No lower extremity tenderness nor edema. No gross deformities of extremities. Neurologic:  Normal speech and language. No gross focal neurologic deficits are appreciated.  Skin:  Skin is warm, dry and intact. No rash noted.   ____________________________________________   LABS (all labs ordered are listed, but only abnormal results are displayed)  Labs Reviewed  COMPREHENSIVE METABOLIC PANEL - Abnormal; Notable for the following components:      Result Value   Sodium 134 (*)    CO2 20 (*)    Glucose, Bld 173 (*)    Creatinine, Ser 1.23 (*)     Calcium 7.8 (*)    Total Protein 5.3 (*)    Albumin 2.1 (*)    GFR calc non Af Amer 43 (*)    GFR calc Af Amer 49 (*)    All other components within normal limits  CBC WITH DIFFERENTIAL/PLATELET - Abnormal; Notable for the following components:   WBC 16.6 (*)    RBC 2.66 (*)    Hemoglobin 7.3 (*)    HCT 23.9 (*)    RDW 19.8 (*)    Neutro Abs 13.9 (*)    Monocytes Absolute 1.1 (*)    Abs Immature Granulocytes 0.13 (*)    All other components within normal limits  CBC - Abnormal; Notable for the following components:   WBC 11.2 (*)    RBC 3.81 (*)    Hemoglobin 11.1 (*)    HCT 33.2 (*)    RDW 15.9 (*)    All other components within normal limits  COMPREHENSIVE METABOLIC PANEL - Abnormal; Notable for the following components:   Creatinine, Ser 1.24 (*)    Calcium 8.1 (*)    Total Protein 5.4 (*)    Albumin 2.2 (*)    GFR calc non Af Wyvonnia Lora  42 (*)    GFR calc Af Amer 49 (*)    All other components within normal limits  POC OCCULT BLOOD, ED - Abnormal; Notable for the following components:   Fecal Occult Bld POSITIVE (*)    All other components within normal limits  RESPIRATORY PANEL BY RT PCR (FLU A&B, COVID)  LIPASE, BLOOD  PROTIME-INR  TYPE AND SCREEN  PREPARE RBC (CROSSMATCH)   ____________________________________________  EKG  None _______________________________  RADIOLOGY  NM GI Blood Loss  Result Date: 10/28/2019 CLINICAL DATA:  Bright red EXAM: NUCLEAR MEDICINE GASTROINTESTINAL BLEEDING SCAN TECHNIQUE: Sequential abdominal images were obtained following intravenous administration of Tc-19mlabeled red blood cells. RADIOPHARMACEUTICALS:  26 mCi Tc-96mertechnetate in-vitro labeled red cells. COMPARISON:  There is normal distribution of radiotracer in the abdomen. There is accumulation of radiotracer inferior to the aortic bifurcation likely within the patient's pelvis. This radiotracer does not appear to migrate and is favored to represent radiotracer within the  urinary bladder. There is no evidence for active GI bleeding. FINDINGS: No evidence for active GI bleeding on this study. Electronically Signed   By: ChConstance Holster.D.   On: 10/28/2019 01:01    ____________________________________________   PROCEDURES  Procedure(s) performed:   Procedures  CRITICAL CARE Performed by: JoMargette Fastotal critical care time: 35 minutes Critical care time was exclusive of separately billable procedures and treating other patients. Critical care was necessary to treat or prevent imminent or life-threatening deterioration. Critical care was time spent personally by me on the following activities: development of treatment plan with patient and/or surrogate as well as nursing, discussions with consultants, evaluation of patient's response to treatment, examination of patient, obtaining history from patient or surrogate, ordering and performing treatments and interventions, ordering and review of laboratory studies, ordering and review of radiographic studies, pulse oximetry and re-evaluation of patient's condition.  JoNanda QuintonMD Emergency Medicine  ____________________________________________   INITIAL IMPRESSION / ASSESSMENT AND PLAN / ED COURSE  Pertinent labs & imaging results that were available during my care of the patient were reviewed by me and considered in my medical decision making (see chart for details).   Patient presents emergency department with GI bleeding noticed by nursing staff.  On arrival she is tachycardic with blood pressures in the high 80s to mid 9039Jystolic.  She is in no acute distress.  She is awake and alert.  She is conversational.  Some mild cognitive impairment noted during her last admission which seems baseline for her now.  Large volume clot noted on exam rectally.  She was scoped by [facility]ie [facility]er[facility] last month with multiple duodenal and gastric ulcers.  Starting PPI and will send for type and screen.  Patient's mental  status is baseline.  Vitals are somewhat concerning with tachycardia and soft blood pressures.  Will hold on emergency release blood follow her vitals closely.   07:26 PM  Spoke with Dr. KaTherisa Doyneith GI. Recommends GI bleed scan which was ordered and PRBC. Suspect diverticular bleed clinically.   07:45 PM  Spoke with NM tech. They will come in and do her bleeding study STAT.   NM bleed study pending. Remaining labs reviewed. Vitals have stabilized. Will admit pending NM study to IM teaching service.   Discussed patient's case with IMTS to request admission. Patient and family (if present) updated with plan. Care transferred to IM teaching service.  I reviewed all nursing notes, vitals, pertinent old records, EKGs, labs, imaging (as available).  ____________________________________________  FINAL CLINICAL IMPRESSION(S) / ED DIAGNOSES  Final diagnoses:  Gastrointestinal hemorrhage, unspecified gastrointestinal hemorrhage type     MEDICATIONS GIVEN DURING THIS VISIT:  Medications  pantoprazole (PROTONIX) injection 40 mg (40 mg Intravenous Given 10/28/19 0937)  lactated ringers infusion ( Intravenous Hold 10/28/19 0249)  levETIRAcetam (KEPPRA) IVPB 500 mg/100 mL premix (0 mg Intravenous Stopped 10/28/19 1109)  pantoprazole (PROTONIX) 80 mg in sodium chloride 0.9 % 100 mL IVPB (0 mg Intravenous Stopped 10/27/19 2010)  0.9 %  sodium chloride infusion (0 mL/hr Intravenous Stopped 10/28/19 0450)  technetium labeled red blood cells (ULTRATAG) injection kit 25 millicurie (25 millicuries Intravenous Contrast Given 10/28/19 0030)    Note:  This document was prepared using Dragon voice recognition software and may include unintentional dictation errors.  Nanda Quinton, MD, [facility] Emergency Medicine    Morenike Cuff, Wonda [facility], MD 10/28/19 [phone]

## 2019-10-27 NOTE — H&P (Signed)
Date: 10/28/2019               Patient Name:  Caitlin Cervantes MRN: [id number]  DOB: Jun 05, 1941 Age / Sex: 79 y.o., female   PCP: Axel Filler, MD         Medical Service: Internal Medicine Teaching Service         Attending Physician: Dr. Aldine Contes, MD    First Contact: Dr. Court Joy Pager: [phone]  Second Contact: Dr. Truman Hayward Pager: [phone]       After Hours (After 5p/  First Contact Pager: [phone]  weekends / holidays): Second Contact Pager: [phone]   Chief Complaint: BRBPR  History of Present Illness: Caitlin Cervantes has a PMHx significant for Prior GI Bleed (Gastric and duodenal non-bleeding ulcers on EGD), Diverticulosis, hypertension, hyperlipidemia, iron deficiency anemia, CKD III, dementia and pseudogout who presented to [facility] with hematochezia.   Caitlin Cervantes states she was in her usual state of health until bright red blood was noted in her underwear today. She states she was told her stool was dark and black for the last few days. She denies abdominal pain and diarrhea. She denies symptomatic bleeding including chest pain, SOB, dizziness and lightheadedness. She denies coughing up any blood. She also denies fevers, chills, sore throat, rhinorrhea, dysuria, falls and anxiety. She has been compliant with her medications including her PPI. She has been avoiding nsaids per prior recommendations.   Meds:  Current Meds  Medication Sig  . amLODipine (NORVASC) 2.5 MG tablet TAKE ONE TABLET BY MOUTH DAILY (Patient taking differently: Take 2.5 mg by mouth daily. )  . atorvastatin (LIPITOR) 40 MG tablet TAKE 1 TABLET (40 MG TOTAL) BY MOUTH DAILY AT 6 PM.  . donepezil (ARICEPT) 5 MG tablet Take 1 tablet (5 mg total) by mouth at bedtime.  . iron polysaccharides (NU-IRON) 150 MG capsule Take 1 capsule (150 mg total) by mouth daily.  Marland Kitchen levETIRAcetam (KEPPRA) 500 MG tablet TAKE ONE TABLET BY MOUTH TWICE A DAY (Patient taking differently: Take 500 mg by mouth 2 (two) times  daily. )  . pantoprazole (PROTONIX) 40 MG tablet Take 1 tablet (40 mg total) by mouth 2 (two) times daily before a meal.   Allergies: Allergies as of 10/27/2019  . (No Known Allergies)   Past Medical History:  Diagnosis Date  . Anemia 09/30/2019  . Diastolic dysfunction 5/40/0867   Grade I  . Essential hypertension 05/22/2015  . History of active tuberculosis 07/25/1947   s/p left lower lobectomy at age 24 and two years in a local [place] [facility]  . Hyperlipidemia 05/23/2015  . Idiopathic scoliosis of thoracolumbar spine 12/08/2016  . Osteoarthritis of right lower extremity 12/08/2016  . Overweight (BMI 25.0-29.9) 12/08/2016  . Seasonal allergic rhinitis due to pollen 12/08/2016  . Seizures ([place])    Possible seizure disorder marked by stareing events.  . Stage III chronic kidney disease 05/22/2018   Past Surgical History:  Procedure Laterality Date  . BIOPSY  10/02/2019   Procedure: BIOPSY;  Surgeon: Ronnette Juniper, MD;  Location: [facility] ENDOSCOPY;  Service: Gastroenterology;;  . COLONOSCOPY WITH PROPOFOL N/A 10/02/2019   Procedure: COLONOSCOPY WITH PROPOFOL;  Surgeon: Ronnette Juniper, MD;  Location: [place];  Service: Gastroenterology;  Laterality: N/A;  . ESOPHAGOGASTRODUODENOSCOPY (EGD) WITH PROPOFOL N/A 10/02/2019   Procedure: ESOPHAGOGASTRODUODENOSCOPY (EGD) WITH PROPOFOL;  Surgeon: Ronnette Juniper, MD;  Location: [facility];  Service: Gastroenterology;  Laterality: N/A;  . LUNG LOBECTOMY Left    TB  .  POLYPECTOMY  10/02/2019   Procedure: POLYPECTOMY;  Surgeon: Ronnette Juniper, MD;  Location: [facility] [place];  Service: Gastroenterology;;  . TONSILLECTOMY    . TUBAL LIGATION Bilateral    Family History:  Family History  Problem Relation Age of Onset  . Leukemia Mother 29  . Hypertension Sister   . Obstructive Sleep Apnea Sister   . Osteoarthritis Sister   . Hernia Sister        Umbilical  . Lactose intolerance Sister   . Healthy Son   . Seizures Neg Hx    Social History:   Social History   Tobacco Use  . Smoking status: Never Smoker  . Smokeless tobacco: Never Used  Substance Use Topics  . Alcohol use: No    Alcohol/week: 0.0 standard drinks  . Drug use: No   Review of Systems: A complete ROS was negative except as per HPI.   Physical Exam: Blood pressure 98/64, pulse 87, resp. rate 12, SpO2 100 %. Physical Exam  Labs: Results for orders placed or performed during the hospital encounter of 10/27/19 (from the past 24 hour(s))  Respiratory Panel by RT PCR (Flu A&B, Covid) - Nasopharyngeal Swab     Status: None   Collection Time: 10/27/19  5:48 PM   Specimen: Nasopharyngeal Swab  Result Value Ref Range   SARS Coronavirus 2 by RT PCR NEGATIVE NEGATIVE   Influenza A by PCR NEGATIVE NEGATIVE   Influenza B by PCR NEGATIVE NEGATIVE  Comprehensive metabolic panel     Status: Abnormal   Collection Time: 10/27/19  6:00 PM  Result Value Ref Range   Sodium 134 (L) 135 - 145 mmol/L   Potassium 3.9 3.5 - 5.1 mmol/L   Chloride 102 98 - 111 mmol/L   CO2 20 (L) 22 - 32 mmol/L   Glucose, Bld 173 (H) 70 - 99 mg/dL   BUN 18 8 - 23 mg/dL   Creatinine, Ser 1.23 (H) 0.44 - 1.00 mg/dL   Calcium 7.8 (L) 8.9 - 10.3 mg/dL   Total Protein 5.3 (L) 6.5 - 8.1 g/dL   Albumin 2.1 (L) 3.5 - 5.0 g/dL   AST 26 15 - 41 U/L   ALT 19 0 - 44 U/L   Alkaline Phosphatase 73 38 - 126 U/L   Total Bilirubin 0.8 0.3 - 1.2 mg/dL   GFR calc non Af Amer 43 (L) >60 mL/min   GFR calc Af Amer 49 (L) >60 mL/min   Anion gap 12 5 - 15  Lipase, blood     Status: None   Collection Time: 10/27/19  6:00 PM  Result Value Ref Range   Lipase 31 11 - 51 U/L  CBC with Differential     Status: Abnormal   Collection Time: 10/27/19  6:00 PM  Result Value Ref Range   WBC 16.6 (H) 4.0 - 10.5 K/uL   RBC 2.66 (L) 3.87 - 5.11 MIL/uL   Hemoglobin 7.3 (L) 12.0 - 15.0 g/dL   HCT 23.9 (L) 36.0 - 46.0 %   MCV 89.8 80.0 - 100.0 fL   MCH 27.4 26.0 - 34.0 pg   MCHC 30.5 30.0 - 36.0 g/dL   RDW 19.8 (H)  11.5 - 15.5 %   Platelets 318 150 - 400 K/uL   nRBC 0.0 0.0 - 0.2 %   Neutrophils Relative % 84 %   Neutro Abs 13.9 (H) 1.7 - 7.7 K/uL   Lymphocytes Relative 8 %   Lymphs Abs 1.4 0.7 - 4.0 K/uL  Monocytes Relative 7 %   Monocytes Absolute 1.1 (H) 0.1 - 1.0 K/uL   Eosinophils Relative 0 %   Eosinophils Absolute 0.0 0.0 - 0.5 K/uL   Basophils Relative 0 %   Basophils Absolute 0.0 0.0 - 0.1 K/uL   Immature Granulocytes 1 %   Abs Immature Granulocytes 0.13 (H) 0.00 - 0.07 K/uL  Protime-INR     Status: None   Collection Time: 10/27/19  6:00 PM  Result Value Ref Range   Prothrombin Time 13.4 11.4 - 15.2 seconds   INR 1.0 0.8 - 1.2  Type and screen [place]     Status: None (Preliminary result)   Collection Time: 10/27/19  6:06 PM  Result Value Ref Range   ABO/RH(D) B POS    Antibody Screen NEG    Sample Expiration      10/30/2019,2359 Performed at [facility], [place] [address], [place], [place] [address]    Unit Number [id number]    Blood Component Type RED CELLS,LR    Unit division 00    Status of Unit ALLOCATED    Transfusion Status OK TO TRANSFUSE    Crossmatch Result Compatible    Unit Number [id number]    Blood Component Type RED CELLS,LR    Unit division 00    Status of Unit ALLOCATED    Transfusion Status OK TO TRANSFUSE    Crossmatch Result Compatible   POC occult blood, ED Provider will collect     Status: Abnormal   Collection Time: 10/27/19  6:35 PM  Result Value Ref Range   Fecal Occult Bld POSITIVE (A) NEGATIVE  Prepare RBC (crossmatch)     Status: None   Collection Time: 10/27/19  7:00 PM  Result Value Ref Range   Order Confirmation      ORDER PROCESSED BY BLOOD BANK Performed at [facility], [place] [address] [place], [place] [address]     EKG: personally reviewed my interpretation is: na  CXR: personally reviewed my interpretation is: na  NM GI Blood Loss  Result Date: 10/28/2019 CLINICAL DATA:  Bright red EXAM:  NUCLEAR MEDICINE GASTROINTESTINAL BLEEDING SCAN TECHNIQUE: Sequential abdominal images were obtained following intravenous administration of Tc-66m labeled red blood cells. RADIOPHARMACEUTICALS:  26 mCi Tc-56m pertechnetate in-vitro labeled red cells. COMPARISON:  There is normal distribution of radiotracer in the abdomen. There is accumulation of radiotracer inferior to the aortic bifurcation likely within the patient's pelvis. This radiotracer does not appear to migrate and is favored to represent radiotracer within the urinary bladder. There is no evidence for active GI bleeding. FINDINGS: No evidence for active GI bleeding on this study. Electronically Signed   By: Constance Holster M.D.   On: 10/28/2019 01:01   Assessment: Ms. Isais has a PMHx significant for Prior GI Bleed (Gastric and duodenal non-bleeding ulcers on EGD), diverticulosis, hypertension, hyperlipidemia, iron deficiency anemia, CKD III, dementia and pseudogout who presented to [facility] with painless hematochezia thought to be most consistent with a diverticular bleed without active bleeding on NM imaging.   Plan by Problem:  BRBPR: -presenting with painless BRBPR -pt reports she was asymptomatic (no chest pain, SOB, dizziness or LOC) although she was tachycardic and hypotensive on admission which have both resolved with fluids -Hgb 7.3 on admission -GI consulted in the ED and thinks this is a diverticular bleed and recommended a nuclear study and plan for possible IR intervention; NM study negative for active bleeding -pt stabilized with fluids and will  receive blood transfusion overnight  -pt has had prior admissions for symptomatic anemia due to upper GI bleeding from gastric ulcers and cameron ulcer from hiatal hernia but this bleeding episode presumed to be lower per GI -however, pt does report a few days of black stools concerning for upper GI bleeding and this bright red blood could be a brisk upper GI bleed; she was  loaded with protonix in the ED  Plan: -protonix 40 BID -proceed with blood transfusion -fu post transfusion H&H -GI consult in AM -daily CBC  Leukocytosis: -this is chronic but decreased from prior levels in the mid to upper 20s from last admission -may be due to inflammation -no fevers or infectious sx  Plan: -fu CBC in the AM  CKD III -Creatinine of 1.23 which is around her baseline of 1.2-1.3  Plan: -avoid nephrotoxic medications  Hypertension: -holding home amlodipine due to low BP on admission -could consider restarting in the am  HLD: -continue home atorvastatin  Hx of Seizures: -continue home keppra 500 mg daily  Principal Problem:   Acute GI bleeding   Dispo: Admit patient to Inpatient with expected length of stay greater than 2 midnights.  Signed: Al Decant, MD 10/28/2019, 1:12 AM  Pager: [phone]

## 2019-10-27 NOTE — ED Triage Notes (Signed)
Pt arrives to ED from [facility] with complaints of bright red rectal bleeding starting today. Patient states she has no pain and did not realize she was bleeding until the RN at the NH found it today.

## 2019-10-27 NOTE — ED Notes (Signed)
Caitlin Cervantes son [phone] looking for an update

## 2019-10-28 DIAGNOSIS — Z8669 Personal history of other diseases of the nervous system and sense organs: Secondary | ICD-10-CM

## 2019-10-28 DIAGNOSIS — Z8719 Personal history of other diseases of the digestive system: Secondary | ICD-10-CM

## 2019-10-28 DIAGNOSIS — Z79899 Other long term (current) drug therapy: Secondary | ICD-10-CM

## 2019-10-28 DIAGNOSIS — K259 Gastric ulcer, unspecified as acute or chronic, without hemorrhage or perforation: Secondary | ICD-10-CM

## 2019-10-28 DIAGNOSIS — D72829 Elevated white blood cell count, unspecified: Secondary | ICD-10-CM

## 2019-10-28 DIAGNOSIS — M1120 Other chondrocalcinosis, unspecified site: Secondary | ICD-10-CM

## 2019-10-28 DIAGNOSIS — E785 Hyperlipidemia, unspecified: Secondary | ICD-10-CM

## 2019-10-28 DIAGNOSIS — N183 Chronic kidney disease, stage 3 unspecified: Secondary | ICD-10-CM

## 2019-10-28 DIAGNOSIS — K269 Duodenal ulcer, unspecified as acute or chronic, without hemorrhage or perforation: Secondary | ICD-10-CM

## 2019-10-28 DIAGNOSIS — I129 Hypertensive chronic kidney disease with stage 1 through stage 4 chronic kidney disease, or unspecified chronic kidney disease: Secondary | ICD-10-CM

## 2019-10-28 DIAGNOSIS — K922 Gastrointestinal hemorrhage, unspecified: Secondary | ICD-10-CM

## 2019-10-28 DIAGNOSIS — K5790 Diverticulosis of intestine, part unspecified, without perforation or abscess without bleeding: Secondary | ICD-10-CM

## 2019-10-28 DIAGNOSIS — Z9889 Other specified postprocedural states: Secondary | ICD-10-CM

## 2019-10-28 DIAGNOSIS — F0390 Unspecified dementia without behavioral disturbance: Secondary | ICD-10-CM

## 2019-10-28 DIAGNOSIS — D62 Acute posthemorrhagic anemia: Secondary | ICD-10-CM

## 2019-10-28 LAB — COMPREHENSIVE METABOLIC PANEL
ALT: 20 U/L (ref 0–44)
AST: 29 U/L (ref 15–41)
Albumin: 2.2 g/dL — ABNORMAL LOW (ref 3.5–5.0)
Alkaline Phosphatase: 71 U/L (ref 38–126)
Anion gap: 11 (ref 5–15)
BUN: 18 mg/dL (ref 8–23)
CO2: 22 mmol/L (ref 22–32)
Calcium: 8.1 mg/dL — ABNORMAL LOW (ref 8.9–10.3)
Chloride: 105 mmol/L (ref 98–111)
Creatinine, Ser: 1.24 mg/dL — ABNORMAL HIGH (ref 0.44–1.00)
GFR calc Af Amer: 49 mL/min — ABNORMAL LOW (ref >60)
GFR calc non Af Amer: 42 mL/min — ABNORMAL LOW (ref >60)
Glucose, Bld: 96 mg/dL (ref 70–99)
Potassium: 4.2 mmol/L (ref 3.5–5.1)
Sodium: 138 mmol/L (ref 135–145)
Total Bilirubin: 0.8 mg/dL (ref 0.3–1.2)
Total Protein: 5.4 g/dL — ABNORMAL LOW (ref 6.5–8.1)

## 2019-10-28 LAB — CBC
HCT: 33.2 % — ABNORMAL LOW (ref 36.0–46.0)
Hemoglobin: 11.1 g/dL — ABNORMAL LOW (ref 12.0–15.0)
MCH: 29.1 pg (ref 26.0–34.0)
MCHC: 33.4 g/dL (ref 30.0–36.0)
MCV: 87.1 fL (ref 80.0–100.0)
Platelets: 240 10*3/uL (ref 150–400)
RBC: 3.81 MIL/uL — ABNORMAL LOW (ref 3.87–5.11)
RDW: 15.9 % — ABNORMAL HIGH (ref 11.5–15.5)
WBC: 11.2 10*3/uL — ABNORMAL HIGH (ref 4.0–10.5)
nRBC: 0 % (ref 0.0–0.2)

## 2019-10-28 MED ORDER — TECHNETIUM TC 99M-LABELED RED BLOOD CELLS IV KIT
25.0000 | PACK | Freq: Once | INTRAVENOUS | Status: AC | PRN
  Administered 2019-10-28: 25 via INTRAVENOUS

## 2019-10-28 NOTE — ED Notes (Signed)
Tele  [phone] son Caitlin Cervantes would like an update

## 2019-10-28 NOTE — ED Notes (Signed)
Blood consent obtained from patient, hard copy in Medical Records and other copy is with patient.

## 2019-10-28 NOTE — ED Notes (Signed)
Blood Bank called to verify release of blood.

## 2019-10-28 NOTE — ED Notes (Signed)
Pt being transfused at this time.Can not draw labs.

## 2019-10-28 NOTE — Hospital Course (Addendum)
#   Lower GI Bleed: Patient with approximately 4 days of bright red blood per rectum and dark stools. Patient was asymptomatic but tachycardic and mildly hypotensive on admission with hemoglobin of 7.3. During prior encounter (09/30/19-10/02/19), patient had asymptomatic anemia to hemoglobin of 3.9. At that time, EGD demonstrated non-bleeding gastric and duodenal ulcers. Biopsies were without evidence for H. Pylori or dysplasia on pathologic analysis. Colonoscopy demonstrated diverticulosis in distal colon and polyps in the transverse colon. During this encounter, GI was consulted, patient placed on IV fluids, transfused 2 units PRBCS, and given protonix IV BID. Post-transfusion hemoglobin of 11.2. Nuclear medicine GI bleeding scan was performed and was without evidence for bleeding.        EGD (10/02/2019, Prior hospitalization): IMPRESSION: Widely patent [name]. - 6 cm hiatal hernia. - Non-bleeding gastric ulcers with pigmented material. Biopsied. - Erythematous mucosa in the antrum. Biopsied. - Non-bleeding duodenal ulcers with a clean ulcer base (Forrest Class III). Biopsied.  FINAL MICROSCOPIC DIAGNOSIS:  A. DUODENUM, BIOPSY:  - Peptic duodenitis.  - No dysplasia or malignancy.   B. STOMACH, ANTRUM, BIOPSY:  - Reactive gastropathy.  - Warthin-Starry is negative for Helicobacter pylori.  - No intestinal metaplasia, dysplasia, or malignancy.   C. STOMACH, ABNORMAL BODY MUCOSA, BIOPSY:  - Reactive gastropathy with erosions.  - No intestinal metaplasia, dysplasia, or malignancy.   D. COLON, TRANSVERSE, POLYPECTOMY:  - Tubular adenoma.  - No high grade dysplasia or malignancy.  Colonoscopy (10/02/2019, Prior hospitalization): IMPRESSION: - Preparation of the colon was fair. - Diverticulosis in the sigmoid colon, in the descending colon, in the transverse colon and at the hepatic flexure. - Stool in the transverse colon, at the hepatic flexure, in the ascending colon, in the  cecum and at the appendiceal orifice. - Three 3 to 5 mm polyps in the transverse colon, removed piecemeal using a cold biopsy forceps. Resected and retrieved. - The examination was otherwise normal on direct and retroflexion views.  Echocardiogram (10/11/19, Prior hospitalization): IMPRESSION: 1. Left ventricular ejection fraction, by estimation, is 70 to 75%. The  left ventricle has hyperdynamic function. The left ventricle has no  regional wall motion abnormalities. There is mild left ventricular  hypertrophy (LV wall thickness measurements  are not correct). Left ventricular diastolic parameters are consistent  with Grade I diastolic dysfunction (impaired relaxation). The LVOT is  relatively small with turbulent flow (could correlate with a systolic  murmur).   2. Right ventricular systolic function is normal. The right ventricular  size is normal.   3. The mitral valve is grossly normal with mild annular calcification.  Mild mitral valve regurgitation.   4. The aortic valve is tricuspid. Aortic valve regurgitation is not  visualized. Mild aortic valve sclerosis is present, with no evidence of  aortic valve stenosis.   5. IVC not visualized.   6. Trivial circumferential pericardial effusion.   NM GI Bleed Loss (10/27/2019): FINDINGS: There is normal distribution of radiotracer in the abdomen. There is accumulation of radiotracer inferior to the aortic bifurcation likely within the patient's pelvis. This radiotracer does not appear to migrate and is favored to represent radiotracer within the urinary bladder. There is no evidence for active GI bleeding.   IMPRESSION: No evidence for active GI bleeding on this study.

## 2019-10-28 NOTE — Progress Notes (Addendum)
Subjective:  Patient seen at bedside. Patient reports she had about 4 days of bloody/dark stools. Patient reports she feels well, denies pain, difficulty breathing. Patient counseled on plan of care, all questions answered.  Objective:    Vital Signs (last 24 hours): Vitals:   10/28/19 0845 10/28/19 0930 10/28/19 1000 10/28/19 1030  BP: 121/63 120/67 105/63 109/63  Pulse: 77 83 75 76  Resp: 19 16 (!) 22 19  Temp:      TempSrc:      SpO2: 99% 98% 99% 99%   Physical Exam: General Alert and answers questions appropriately, no acute distress  Cardiac Regular rate and rhythm, no murmurs, rubs, or gallops  Pulmonary Clear to auscultation bilaterally without wheezes, rhonchi, or rales  Abdominal Soft, non-tender, without distention. Bowel sounds present    EGD (10/02/2019, Prior hospitalization): IMPRESSION: Widely patent [name]. - 6 cm hiatal hernia. - Non-bleeding gastric ulcers with pigmented material. Biopsied. - Erythematous mucosa in the antrum. Biopsied. - Non-bleeding duodenal ulcers with a clean ulcer base (Forrest Class III). Biopsied.  FINAL MICROSCOPIC DIAGNOSIS:  A. DUODENUM, BIOPSY:  - Peptic duodenitis.  - No dysplasia or malignancy.   B. STOMACH, ANTRUM, BIOPSY:  - Reactive gastropathy.  - Warthin-Starry is negative for Helicobacter pylori.  - No intestinal metaplasia, dysplasia, or malignancy.   C. STOMACH, ABNORMAL BODY MUCOSA, BIOPSY:  - Reactive gastropathy with erosions.  - No intestinal metaplasia, dysplasia, or malignancy.   D. COLON, TRANSVERSE, POLYPECTOMY:  - Tubular adenoma.  - No high grade dysplasia or malignancy.  Colonoscopy (10/02/2019, Prior hospitalization): IMPRESSION: - Preparation of the colon was fair. - Diverticulosis in the sigmoid colon, in the descending colon, in the transverse colon and at the hepatic flexure. - Stool in the transverse colon, at the hepatic flexure, in the ascending colon, in the cecum and at the  appendiceal orifice. - Three 3 to 5 mm polyps in the transverse colon, removed piecemeal using a cold biopsy forceps. Resected and retrieved. - The examination was otherwise normal on direct and retroflexion views.  Echocardiogram (10/11/19, Prior hospitalization): IMPRESSION: 1. Left ventricular ejection fraction, by estimation, is 70 to 75%. The  left ventricle has hyperdynamic function. The left ventricle has no  regional wall motion abnormalities. There is mild left ventricular  hypertrophy (LV wall thickness measurements  are not correct). Left ventricular diastolic parameters are consistent  with Grade I diastolic dysfunction (impaired relaxation). The LVOT is  relatively small with turbulent flow (could correlate with a systolic  murmur).  2. Right ventricular systolic function is normal. The right ventricular  size is normal.  3. The mitral valve is grossly normal with mild annular calcification.  Mild mitral valve regurgitation.  4. The aortic valve is tricuspid. Aortic valve regurgitation is not  visualized. Mild aortic valve sclerosis is present, with no evidence of  aortic valve stenosis.  5. IVC not visualized.  6. Trivial circumferential pericardial effusion.   NM GI Bleed Loss (10/27/2019): FINDINGS: There is normal distribution of radiotracer in the abdomen. There is accumulation of radiotracer inferior to the aortic bifurcation likely within the patient's pelvis. This radiotracer does not appear to migrate and is favored to represent radiotracer within the urinary bladder. There is no evidence for active GI bleeding.  IMPRESSION: No evidence for active GI bleeding on this study.  CBC Latest Ref Rng & Units 10/28/2019 10/27/2019 10/13/2019  WBC 4.0 - 10.5 K/uL 11.2(H) 16.6(H) 23.8(H)  Hemoglobin 12.0 - 15.0 g/dL 11.1(L) 7.3(L) 8.4(L)  Hematocrit  36.0 - 46.0 % 33.2(L) 23.9(L) 26.2(L)  Platelets 150 - 400 K/uL 240 318 1,035(HH)   BMP Latest Ref Rng & Units  10/28/2019 10/27/2019 10/13/2019  Glucose 70 - 99 mg/dL 96 173(H) 108(H)  BUN 8 - 23 mg/dL 18 18 32(H)  Creatinine 0.44 - 1.00 mg/dL 1.24(H) 1.23(H) 1.28(H)  BUN/Creat Ratio 12 - 28 - - -  Sodium 135 - 145 mmol/L 138 134(L) 136  Potassium 3.5 - 5.1 mmol/L 4.2 3.9 3.8  Chloride 98 - 111 mmol/L 105 102 100  CO2 22 - 32 mmol/L 22 20(L) 25  Calcium 8.9 - 10.3 mg/dL 8.1(L) 7.8(L) 8.8(L)    Assessment/Plan:   Principal Problem:   Acute GI bleeding  Patient is a 79 year old female with PMH significant for prior GI bleed (gastic and duodenal non-bleeding ulcers on EGD), diverticulosis, hypertension, hyperlipidemia, iron deficiency anemia, CKD stage III, dementia, and pseudogout who presented to [facility] with painless hematochezia thought to be most consistent with a diverticular bleed  # Lower GI Bleed: Patient with approximately 4 days of bright red blood per rectum and dark stools. Patient was asymptomatic but tachycardic and mildly hypotensive on admission with hemoglobin of 7.3. During prior encounter (09/30/19-10/02/19), patient had asymptomatic anemia to hemoglobin of 3.9. At that time, EGD demonstrated non-bleeding gastric and duodenal ulcers. Biopsies were without evidence for H. Pylori or dysplasia on pathologic analysis. Colonoscopy demonstrated diverticulosis in distal colon and polyps in the transverse colon. During this encounter, GI was consulted, patient placed on IV fluids, transfused 2 units PRBCS, and given protonix IV BID. Post-transfusion hemoglobin of 11.2. Nuclear medicine GI bleeding scan was performed and was without evidence for bleeding. *GI following, we appreciate their continued recommendations. They are considering repeat EGD with capsule deployment if hemoglobin does not remain stable *Goal hemoglobin > 8 *Continue protonix 40 mg IV BID *Daily CBC *Full liquid diet  # History of seizures: Continue home keppra 500 mg daily  PT/OT: Consulted Diet: Full liquid DVT Ppx:  SCDs, no pharmacologic VTE ppx 2/2 bleeding Admit Status: Inpatient Dispo: Anticipated discharge pending clinical improvement  Jeanmarie Hubert, MD 10/28/2019, 10:47 AM

## 2019-10-28 NOTE — Progress Notes (Signed)
Caitlin Cervantes is a 79 y.o. female patient admitted from ED awake, alert - oriented  X 4 - no acute distress noted.  VSS - Blood pressure 100/83, pulse 82, temperature 98 F (36.7 C), temperature source Oral, resp. rate 18, SpO2 97 %.    IV in place, occlusive dsg intact without redness.  Orientation to room, and floor completed with information packet given to patient. Call light within reach, patient able to voice, and demonstrate understanding.  Skin, clean-dry- intact without evidence of bruising, or skin tears.   No evidence of skin break down noted on exam.    Will cont to eval and treat per MD orders.  Jeanella Craze, RN

## 2019-10-28 NOTE — Consult Note (Signed)
Referring Provider:  EDP Primary Care Physician:  Axel Filler, MD Primary Gastroenterologist: Althia Forts  Reason for Consultation: GI bleed  HPI: Caitlin Cervantes is a 79 y.o. female with past medical history mentioned below presented to the hospital with recurrent GI bleed.  She was recently discharged from hospital after being treated for severe symptomatic anemia with hemoglobin of 3.9.    EGD October 02, 2019 showed 6 cm hiatal hernia with Cameron's lesions, and multiple scattered tiny gastric and duodenal ulcers.  Biopsies were negative for H. pylori.  Colonoscopy showed 3 transverse colon polyps and diverticulosis.  Pathology showed tubular adenoma.  She was discharged to facility on October 13, 2019 with hemoglobin of 8.4.  Nursing staff at facility noted some dark-colored stool once he was transferred back to hospital for further evaluation.  Occult blood was positive  Patient denies seeing any bright blood in the stool.  She denies any abdominal pain, nausea and vomiting.  Denies reflux, dysphagia and odynophagia.  GI bleeding scan negative   Past Medical History:  Diagnosis Date  . Anemia 09/30/2019  . Diastolic dysfunction 11/09/6220   Grade I  . Essential hypertension 05/22/2015  . History of active tuberculosis 07/25/1947   s/p left lower lobectomy at age 40 and two years in a local [facility]  . Hyperlipidemia 05/23/2015  . Idiopathic scoliosis of thoracolumbar spine 12/08/2016  . Osteoarthritis of right lower extremity 12/08/2016  . Overweight (BMI 25.0-29.9) 12/08/2016  . Seasonal allergic rhinitis due to pollen 12/08/2016  . Seizures ([place])    Possible seizure disorder marked by stareing events.  . Stage III chronic kidney disease 05/22/2018    Past Surgical History:  Procedure Laterality Date  . BIOPSY  10/02/2019   Procedure: BIOPSY;  Surgeon: Ronnette Juniper, MD;  Location: [facility] ENDOSCOPY;  Service: Gastroenterology;;  . COLONOSCOPY WITH PROPOFOL N/A  10/02/2019   Procedure: COLONOSCOPY WITH PROPOFOL;  Surgeon: Ronnette Juniper, MD;  Location: [place] [facility];  Service: Gastroenterology;  Laterality: N/A;  . ESOPHAGOGASTRODUODENOSCOPY (EGD) WITH PROPOFOL N/A 10/02/2019   Procedure: ESOPHAGOGASTRODUODENOSCOPY (EGD) WITH PROPOFOL;  Surgeon: Ronnette Juniper, MD;  Location: [place];  Service: Gastroenterology;  Laterality: N/A;  . LUNG LOBECTOMY Left    TB  . POLYPECTOMY  10/02/2019   Procedure: POLYPECTOMY;  Surgeon: Ronnette Juniper, MD;  Location: [place];  Service: Gastroenterology;;  . TONSILLECTOMY    . TUBAL LIGATION Bilateral     Prior to Admission medications   Medication Sig Start Date End Date Taking? Authorizing Provider  amLODipine (NORVASC) 2.5 MG tablet TAKE ONE TABLET BY MOUTH DAILY Patient taking differently: Take 2.5 mg by mouth daily.  08/12/19  Yes Axel Filler, MD  atorvastatin (LIPITOR) 40 MG tablet TAKE 1 TABLET (40 MG TOTAL) BY MOUTH DAILY AT 6 PM. 08/12/19  Yes Axel Filler, MD  donepezil (ARICEPT) 5 MG tablet Take 1 tablet (5 mg total) by mouth at bedtime. 10/06/19 10/05/20 Yes Neva Seat, MD  iron polysaccharides (NU-IRON) 150 MG capsule Take 1 capsule (150 mg total) by mouth daily. 10/02/19  Yes Christian, Rylee, MD  levETIRAcetam (KEPPRA) 500 MG tablet TAKE ONE TABLET BY MOUTH TWICE A DAY Patient taking differently: Take 500 mg by mouth 2 (two) times daily.  08/12/19  Yes Axel Filler, MD  pantoprazole (PROTONIX) 40 MG tablet Take 1 tablet (40 mg total) by mouth 2 (two) times daily before a meal. 10/02/19 12/01/19 Yes Christian, Rylee, MD    Scheduled Meds: . pantoprazole ([place]) IV  40 mg Intravenous Q12H   Continuous Infusions: . lactated ringers Stopped (10/28/19 0249)  . levETIRAcetam     PRN Meds:.  Allergies as of 10/27/2019  . (No Known Allergies)    Family History  Problem Relation Age of Onset  . Leukemia Mother 34  . Hypertension Sister   . Obstructive Sleep Apnea  Sister   . Osteoarthritis Sister   . Hernia Sister        Umbilical  . Lactose intolerance Sister   . Healthy Son   . Seizures Neg Hx     Social History   Socioeconomic History  . Marital status: Single    Spouse name: Not on file  . Number of children: 1  . Years of education: HS  . Highest education level: Not on file  Occupational History  . Occupation: Retired    Comment: Worked in Scientist, research (medical) and office work  Tobacco Use  . Smoking status: Never Smoker  . Smokeless tobacco: Never Used  Substance and Sexual Activity  . Alcohol use: No    Alcohol/week: 0.0 standard drinks  . Drug use: No  . Sexual activity: Not Currently    Birth control/protection: None  Other Topics Concern  . Not on file  Social History Narrative   Lives with her son in an apartment (2nd floor unit) in [place].  Never married.  No pets currently. She is right handed.    Social Determinants of Health   Financial Resource Strain:   . Difficulty of Paying Living Expenses:   Food Insecurity:   . Worried About Charity fundraiser in the Last Year:   . Arboriculturist in the Last Year:   Transportation Needs:   . Film/video editor (Medical):   Marland Kitchen Lack of Transportation (Non-Medical):   Physical Activity:   . Days of Exercise per Week:   . Minutes of Exercise per Session:   Stress:   . Feeling of Stress :   Social Connections:   . Frequency of Communication with Friends and Family:   . Frequency of Social Gatherings with Friends and Family:   . Attends Religious Services:   . Active Member of Clubs or Organizations:   . Attends Archivist Meetings:   Marland Kitchen Marital Status:   Intimate Partner Violence:   . Fear of Current or Ex-Partner:   . Emotionally Abused:   Marland Kitchen Physically Abused:   . Sexually Abused:     Review of Systems: Review of Systems  Constitutional: Negative for chills and fever.  HENT: Negative for hearing loss and tinnitus.   Eyes: Negative for blurred vision and  double vision.  Respiratory: Negative for cough and hemoptysis.   Cardiovascular: Negative for chest pain and palpitations.  Gastrointestinal: Positive for melena. Negative for abdominal pain, constipation, diarrhea, heartburn, nausea and vomiting.  Genitourinary: Negative for dysuria and urgency.  Musculoskeletal: Positive for joint pain and myalgias.  Skin: Negative for itching and rash.  Neurological: Negative for seizures and loss of consciousness.  Endo/Heme/Allergies: Bruises/bleeds easily.  Psychiatric/Behavioral: Negative for substance abuse. The patient is not nervous/anxious.     Physical Exam: Vital signs: Vitals:   10/28/19 0722 10/28/19 0730  BP: 123/69 132/77  Pulse: 77 82  Resp: 20 16  Temp: 98.2 F (36.8 C)   SpO2:  100%   Physical Exam  Constitutional: She is oriented to person, place, and time. She appears well-developed and well-nourished. No distress.  HENT:  Head: Normocephalic and atraumatic.  Mouth/Throat:  Oropharynx is clear and moist.  Eyes: EOM are normal. No scleral icterus.  Cardiovascular: Normal rate and normal heart sounds.  No murmur heard. Pulmonary/Chest: Effort normal. No respiratory distress.  Ant exam only  Abdominal: Soft. Bowel sounds are normal. She exhibits no distension. There is no abdominal tenderness. There is no rebound and no guarding.  Musculoskeletal:        General: No edema. Normal range of motion.     Cervical back: Normal range of motion and neck supple.  Neurological: She is alert and oriented to person, place, and time.  Skin: Skin is warm. No erythema.  Psychiatric: She has a normal mood and affect. Judgment and thought content normal.  Vitals reviewed.   GI:  Lab Results: Recent Labs    10/27/19 1800  WBC 16.6*  HGB 7.3*  HCT 23.9*  PLT 318   BMET Recent Labs    10/27/19 1800  NA 134*  K 3.9  CL 102  CO2 20*  GLUCOSE 173*  BUN 18  CREATININE 1.23*  CALCIUM 7.8*   LFT Recent Labs     10/27/19 1800  PROT 5.3*  ALBUMIN 2.1*  AST 26  ALT 19  ALKPHOS 73  BILITOT 0.8   PT/INR Recent Labs    10/27/19 1800  LABPROT 13.4  INR 1.0     Studies/Results: NM GI Blood Loss  Result Date: 10/28/2019 CLINICAL DATA:  Bright red EXAM: NUCLEAR MEDICINE GASTROINTESTINAL BLEEDING SCAN TECHNIQUE: Sequential abdominal images were obtained following intravenous administration of Tc-34m labeled red blood cells. RADIOPHARMACEUTICALS:  26 mCi Tc-56m pertechnetate in-vitro labeled red cells. COMPARISON:  There is normal distribution of radiotracer in the abdomen. There is accumulation of radiotracer inferior to the aortic bifurcation likely within the patient's pelvis. This radiotracer does not appear to migrate and is favored to represent radiotracer within the urinary bladder. There is no evidence for active GI bleeding. FINDINGS: No evidence for active GI bleeding on this study. Electronically Signed   By: Constance Holster M.D.   On: 10/28/2019 01:01    Impression/Plan: -Dark stools, occult blood positive stool.  Could be from Cameron's lesions or tiny gastric and duodenal ulcers that was seen on recent EGD.  GI bleeding scan negative. -Chronic blood loss anemia.  Mild drop in hemoglobin noted  Recommendations ------------------------ -Continue IV twice daily PPI -Monitor H&H.  Transfuse to keep hemoglobin around 8 -Okay to start full liquid diet -If no improvement in hemoglobin with blood transfusion, recommend repeat EGD with deployment of capsule -GI will follow    LOS: 1 day   Otis Brace  MD, FACP 10/28/2019, 8:05 AM  Contact #  [phone]

## 2019-10-28 NOTE — ED Notes (Signed)
Lunch Tray Ordered @ 1052.  

## 2019-10-28 NOTE — ED Notes (Signed)
RN notified that blood is ready. Tech sent to obtain one unit.

## 2019-10-29 LAB — BPAM RBC
Blood Product Expiration Date: 202105052359
Blood Product Expiration Date: 202105052359
ISSUE DATE / TIME: 202104060230
ISSUE DATE / TIME: 202104060454
Unit Type and Rh: 7300
Unit Type and Rh: 7300

## 2019-10-29 LAB — TYPE AND SCREEN
ABO/RH(D): B POS
Antibody Screen: NEGATIVE
Unit division: 0
Unit division: 0

## 2019-10-29 LAB — CBC
HCT: 31.4 % — ABNORMAL LOW (ref 36.0–46.0)
Hemoglobin: 10.4 g/dL — ABNORMAL LOW (ref 12.0–15.0)
MCH: 29.3 pg (ref 26.0–34.0)
MCHC: 33.1 g/dL (ref 30.0–36.0)
MCV: 88.5 fL (ref 80.0–100.0)
Platelets: 208 10*3/uL (ref 150–400)
RBC: 3.55 MIL/uL — ABNORMAL LOW (ref 3.87–5.11)
RDW: 16 % — ABNORMAL HIGH (ref 11.5–15.5)
WBC: 10.6 10*3/uL — ABNORMAL HIGH (ref 4.0–10.5)
nRBC: 0 % (ref 0.0–0.2)

## 2019-10-29 LAB — BASIC METABOLIC PANEL
Anion gap: 11 (ref 5–15)
BUN: 16 mg/dL (ref 8–23)
CO2: 23 mmol/L (ref 22–32)
Calcium: 8.1 mg/dL — ABNORMAL LOW (ref 8.9–10.3)
Chloride: 104 mmol/L (ref 98–111)
Creatinine, Ser: 1.2 mg/dL — ABNORMAL HIGH (ref 0.44–1.00)
GFR calc Af Amer: 51 mL/min — ABNORMAL LOW (ref >60)
GFR calc non Af Amer: 44 mL/min — ABNORMAL LOW (ref >60)
Glucose, Bld: 82 mg/dL (ref 70–99)
Potassium: 3.9 mmol/L (ref 3.5–5.1)
Sodium: 138 mmol/L (ref 135–145)

## 2019-10-29 LAB — SARS CORONAVIRUS 2 (TAT 6-24 HRS): SARS Coronavirus 2: NEGATIVE

## 2019-10-29 MED ORDER — POLYETHYLENE GLYCOL 3350 17 G PO PACK
17.0000 g | PACK | Freq: Every day | ORAL | Status: DC
  Administered 2019-10-29 – 2019-10-30 (×2): 17 g via ORAL
  Filled 2019-10-29 (×2): qty 1

## 2019-10-29 NOTE — Progress Notes (Signed)
Occupational Therapy Evaluation Patient Details Name: Caitlin Cervantes MRN: [id number] DOB: 1940/08/05 Today's Date: 10/29/2019    History of Present Illness 79 year old female admitted 10/27/19 with BRBPR. Patient with acute blood loss anemia secondary to GI bleed. Of note, patient with recent admission 09/30/19-10/02/19 for severe anemia with gastric ulcers and another admission mid to late March 2021 for R knee pain and fever. It appears she was then discharged to SNF. PMH: GI bleed (gastic and duodenal non-bleeding ulcers on EGD), diverticulosis, hypertension, hyperlipidemia, iron deficiency anemia, CKD stage III, dementia, and pseudogout    Clinical Impression   PTA, pt's lives with son. Pt was Independent with ADLs and mobility prior to recent medical issues in March of this year. Pt has currently been at [facility] for short-term rehab. Pt presents with decreased strength, endurance, and standing balance. Pt Min A for bed mobility to sit EOB, require assistance to scoot forward. Pt Min A for initial sit to stand with therapist assistance needed to catch/maintain balance, as pt had difficulty during this transitional movement. Pt Min A to min guard for mobility with RW, requiring assistance to maneuver around foot of bed and sequence safely. Pt will require Min A for LB ADLs due to deficits listed above. Recommend short term rehab at SNF to return to PLOF. Will continue to follow acutely.    Follow Up Recommendations  SNF;Supervision/Assistance - 24 hour    Equipment Recommendations  Other (comment)(TBD, based on progress with short term rehab at [facility])    Recommendations for Other Services       Precautions / Restrictions Precautions Precautions: Fall;Other (comment) Precaution Comments: full liquid diet, goal hemoglobin >8, hx seizures Restrictions Weight Bearing Restrictions: No      Mobility Bed Mobility Overal bed mobility: Needs Assistance Bed Mobility: Supine to Sit     Supine to  sit: Min assist     General bed mobility comments: Min A to scoot forward on bed  Transfers Overall transfer level: Needs assistance Equipment used: Rolling walker (2 wheeled) Transfers: Sit to/from [facility] Sit to Stand: Min assist Stand pivot transfers: Min guard       General transfer comment: Min A for initial sit to stand with RW to catch/maintain balance with RW, progressing to min guard at end of session     Balance Overall balance assessment: Needs assistance Sitting-balance support: Feet supported Sitting balance-Leahy Scale: Fair     Standing balance support: Bilateral upper extremity supported Standing balance-Leahy Scale: Poor                             ADL either performed or assessed with clinical judgement   ADL Overall ADL's : Needs assistance/impaired Eating/Feeding: Independent;Sitting   Grooming: Supervision/safety;Standing   Upper Body Bathing: Supervision/ safety;Sitting   Lower Body Bathing: Minimal assistance;Sit to/from stand   Upper Body Dressing : Supervision/safety;Sitting   Lower Body Dressing: Minimal assistance;Sit to/from stand   Toilet Transfer: Minimal assistance;RW;Stand-pivot   Toileting- Water quality scientist and Hygiene: Min guard;Sit to/from stand       Functional mobility during ADLs: Minimal assistance;Rolling walker(Min A progressing to min guard with RW) General ADL Comments: Pt with decreased strength, activity tolerance, and strength impacting ability to safely complete LB ADLs and other tasks in standing without Min A     Vision         Perception     Praxis      Pertinent  Vitals/Pain Pain Assessment: No/denies pain     Hand Dominance Right   Extremity/Trunk Assessment Upper Extremity Assessment Upper Extremity Assessment: Generalized weakness   Lower Extremity Assessment Lower Extremity Assessment: Defer to PT evaluation       Communication  Communication Communication: No difficulties   Cognition Arousal/Alertness: Awake/alert Behavior During Therapy: WFL for tasks assessed/performed Overall Cognitive Status: History of cognitive impairments - at baseline Area of Impairment: Following commands;Memory                 Orientation Level: Disoriented to;Time;Situation   Memory: Decreased short-term memory Following Commands: Follows one step commands inconsistently;Follows one step commands with increased time Safety/Judgement: Decreased awareness of deficits;Decreased awareness of safety         General Comments  Pt on RA, O2 stats WFL. Pt HR increased to 139 during session     Exercises     Shoulder Instructions      Home Living Family/patient expects to be discharged to:: Private residence Living Arrangements: Children(son works outside of the home ) Available Help at Discharge: Family;Available PRN/intermittently Type of Home: Apartment Home Access: Stairs to enter Entrance Stairs-Number of Steps: FOS Entrance Stairs-Rails: Right Home Layout: One level     Bathroom Shower/Tub: Teacher, early years/pre: Standard     Home Equipment: None   Additional Comments: Patient typically sponge bathed. No AD at home, son confirmed.      Prior Functioning/Environment Level of Independence: Independent        Comments: Independent mobility without AD prior to March admissions.        OT Problem List: Decreased strength;Decreased activity tolerance;Impaired balance (sitting and/or standing);Decreased knowledge of use of DME or AE;Decreased safety awareness      OT Treatment/Interventions: Self-care/ADL training;Therapeutic exercise;Energy conservation;DME and/or AE instruction;Therapeutic activities;Patient/family education    OT Goals(Current goals can be found in the care plan section) Acute Rehab OT Goals Patient Stated Goal: to rest  OT Goal Formulation: With patient Time For Goal  Achievement: 11/12/19 Potential to Achieve Goals: Good ADL Goals Pt Will Perform Grooming: with modified independence;standing Pt Will Perform Lower Body Bathing: with supervision;sit to/from stand Pt Will Perform Lower Body Dressing: with min assist;sit to/from stand;sitting/lateral leans Pt Will Transfer to Toilet: with supervision;ambulating;regular height toilet Pt Will Perform Toileting - Clothing Manipulation and hygiene: with modified independence;sitting/lateral leans;sit to/from stand Pt Will Perform Tub/Shower Transfer: with supervision;Stand pivot transfer Pt/caregiver will Perform Home Exercise Program: Increased strength;Both right and left upper extremity;With theraband;With Supervision;With written HEP provided  OT Frequency: Min 2X/week   Barriers to D/C:            Co-evaluation              AM-PAC OT "6 Clicks" Daily Activity     Outcome Measure Help from another person eating meals?: None Help from another person taking care of personal grooming?: A Little Help from another person toileting, which includes using toliet, bedpan, or urinal?: A Little Help from another person bathing (including washing, rinsing, drying)?: A Little Help from another person to put on and taking off regular upper body clothing?: A Little Help from another person to put on and taking off regular lower body clothing?: A Little 6 Click Score: 19   End of Session Equipment Utilized During Treatment: Gait belt;Rolling walker Nurse Communication: Mobility status  Activity Tolerance: Patient tolerated treatment well;Patient limited by fatigue Patient left: in chair;with call bell/phone within reach;with chair alarm set  OT Visit  Diagnosis: Unsteadiness on feet (R26.81);Other abnormalities of gait and mobility (R26.89);Muscle weakness (generalized) (M62.81)                Time: 4862-8241 OT Time Calculation (min): 18 min Charges:  OT General Charges $OT Visit: 1 Visit OT  Evaluation $OT Eval Moderate Complexity: 1 Mod  Layla Maw, OTR/L  Layla Maw 10/29/2019, 2:49 PM

## 2019-10-29 NOTE — Evaluation (Signed)
Physical Therapy Evaluation Patient Details Name: Caitlin Cervantes MRN: [id number] DOB: [birth year] Today's Date: 10/29/2019   History of Present Illness  79 year old female admitted 10/27/19 with BRBPR. Patient with acute blood loss anemia secondary to GI bleed. Of note, patient with recent admission 09/30/19-10/02/19 for severe anemia with gastric ulcers and another admission mid to late March 2021 for R knee pain and fever. It appears she was then discharged to SNF. PMH: GI bleed (gastic and duodenal non-bleeding ulcers on EGD), diverticulosis, hypertension, hyperlipidemia, iron deficiency anemia, CKD stage III, dementia, and pseudogout     Clinical Impression  Patient presents with decreased overall strength, impaired balance, gait dysfunction, and is below her PLOF of independent prior to hospital admissions in early March 2021. Patient requires one person assist for ambulation in room with RW with HR up to 140-145 bpm. Extensive time spent assisting patient with repositioning to increase comfort in chair as patient requesting to go back to bed after only OOB to chair approx 10 minutes. Continued recommendation for discharge to SNF for short term rehabilitation. Son and patient verbalized understanding. Son in agreement.    Follow Up Recommendations SNF    Equipment Recommendations  Rolling walker with 5" wheels       Precautions / Restrictions Precautions Precautions: Fall;Other (comment) Precaution Comments: full liquid diet, goal hemoglobin >8, hx seizures Restrictions Weight Bearing Restrictions: No      Mobility  Bed Mobility   Bed Mobility: Supine to Sit     Supine to sit: Supervision        Transfers Overall transfer level: Needs assistance Equipment used: Rolling walker (2 wheeled) Transfers: Sit to/from [facility] Sit to Stand: Min assist Stand pivot transfers: Min assist       General transfer comment: minA with RW stand-step transfer EOB>chair,  sit<>stand from chair with minA, cues for hand placement, decreased eccentric control with stand>sit  Ambulation/Gait Ambulation/Gait assistance: Min assist Gait Distance (Feet): 35 Feet Assistive device: Rolling walker (2 wheeled) Gait Pattern/deviations: (path deviations, difficulty with obstacle negotiation on R) Gait velocity: decreased   General Gait Details: HR up to 140-145bpm with ambulation. Pulse oximeter not picking up reading.         Balance Overall balance assessment: Needs assistance Sitting-balance support: Feet supported Sitting balance-Leahy Scale: Good     Standing balance support: Bilateral upper extremity supported Standing balance-Leahy Scale: Poor          Pertinent Vitals/Pain Pain Assessment: No/denies pain(denies pain at start of session) Faces Pain Scale: Hurts little more Pain Location: back, sitting in chair Pain Intervention(s): Repositioned    Home Living Family/patient expects to be discharged to:: Private residence Living Arrangements: Children((son, he works outside the home)) Available Help at Discharge: Family;Available PRN/intermittently Type of Home: Apartment Home Access: Stairs to enter Entrance Stairs-Rails: Right Entrance Stairs-Number of Steps: FOS Home Layout: One level Home Equipment: None Additional Comments: Patient typically sponge bathed. No AD at home, son confirmed.    Prior Function Level of Independence: Independent         Comments: Independent mobility without AD prior to March admissions.        Extremity/Trunk Assessment        Lower Extremity Assessment Lower Extremity Assessment: RLE deficits/detail;LLE deficits/detail RLE Deficits / Details: grossly 4/5 LLE Deficits / Details: grossly 4/5       Communication      Cognition Arousal/Alertness: Awake/alert   Overall Cognitive Status: History of cognitive impairments - at baseline  Area of Impairment: Following commands;Memory          General Comments: Patient grossly oriented to place, situation, time (but not month), and person.       General Comments General comments (skin integrity, edema, etc.): Patient on room air. Son present part way through session. He confirmed that the plan is for patient to return to SNF for short term rehabilitation.        Assessment/Plan    PT Assessment Patient needs continued PT services  PT Problem List Decreased strength;Decreased activity tolerance;Decreased balance;Decreased mobility;Decreased knowledge of use of DME;Decreased safety awareness;Pain       PT Treatment Interventions DME instruction;Gait training;Stair training;Functional mobility training;Therapeutic activities;Therapeutic exercise;Balance training;Patient/family education    PT Goals (Current goals can be found in the Care Plan section)  Acute Rehab PT Goals Patient Stated Goal: to go home but understands why she has to go back to rehab PT Goal Formulation: With patient/family Time For Goal Achievement: 11/11/19 Potential to Achieve Goals: Good    Frequency Min 2X/week   Barriers to discharge Inaccessible home environment;Decreased caregiver support stairs to enter, son works       AM-PAC PT "6 Clicks" Mobility  Outcome Measure Help needed turning from your back to your side while in a flat bed without using bedrails?: None Help needed moving from lying on your back to sitting on the side of a flat bed without using bedrails?: None Help needed moving to and from a bed to a chair (including a wheelchair)?: A Little Help needed standing up from a chair using your arms (e.g., wheelchair or bedside chair)?: A Little Help needed to walk in hospital room?: A Little Help needed climbing 3-5 steps with a railing? : A Lot 6 Click Score: 19    End of Session Equipment Utilized During Treatment: Gait belt Activity Tolerance: Patient limited by fatigue Patient left: in chair;with call bell/phone within  reach;with chair alarm set Nurse Communication: Mobility status PT Visit Diagnosis: Muscle weakness (generalized) (M62.81);Unsteadiness on feet (R26.81);Pain    Time: 6578-4696 PT Time Calculation (min) (ACUTE ONLY): 47 min   Charges:   PT Evaluation $PT Eval Moderate Complexity: 1 Mod PT Treatments $Therapeutic Activity: 8-22 mins        Birdie Hopes, PT, DPT Acute Rehab [phone] office    Birdie Hopes 10/29/2019, 9:46 AM

## 2019-10-29 NOTE — NC FL2 (Signed)
  [place] LEVEL OF CARE SCREENING TOOL     IDENTIFICATION  Patient Name: Caitlin Cervantes Birthdate: 09-Sep-1940 Sex: female Admission Date (Current Location): 10/27/2019  [facility] [place] Number:  Herbalist and Address:  [facility], [place] [address] [place], [place] [address]      Provider Number: [id number]  Attending Physician Name and Address:  Aldine Contes, MD  Relative Name and Phone Number:  Susan Bleich (IEP)[phone], [phone]    Current Level of Care: Hospital Recommended Level of Care: [place] Prior Approval Number:    Date Approved/Denied:   PASRR Number: [id number]  Discharge Plan: SNF    Current Diagnoses: Patient Active Problem List   Diagnosis Date Noted  . Acute GI bleeding 10/27/2019  . Pressure injury of skin 10/13/2019  . Polyarthritis 10/10/2019  . Pseudogout of knee, right 10/08/2019  . Hiatal hernia 10/06/2019  . Knee pain 10/06/2019  . Dementia ([place]) 10/06/2019  . Foot pain 10/06/2019  . Combined gastric and duodenal ulcer 10/06/2019  . Acute renal failure superimposed on stage 3b chronic kidney disease ([place]) 09/30/2019  . Increased anion gap metabolic acidosis 02/54/2706  . Leukocytosis 09/30/2019  . Iron deficiency anemia 09/30/2019  . Ischemic vascular disease 05/05/2019  . Stage III chronic kidney disease 05/22/2018  . Diastolic dysfunction 23/76/2831  . Osteoarthritis of right lower extremity 12/08/2016  . Overweight (BMI 25.0-29.9) 12/08/2016  . Healthcare maintenance 09/17/2015  . Seizures ([place])   . Hyperlipidemia 05/23/2015    Orientation RESPIRATION BLADDER Height & Weight     Self, Situation, Place, Time  Normal External catheter Weight:   Height:     BEHAVIORAL SYMPTOMS/MOOD NEUROLOGICAL BOWEL NUTRITION STATUS  (none) (none) Continent Diet(see d/c summary)  AMBULATORY STATUS COMMUNICATION OF NEEDS Skin   Extensive Assist Verbally PU Stage and  Appropriate Care([place] Midstage 2)                       Personal Care Assistance Level of Assistance    Bathing Assistance: Limited assistance Feeding assistance: Independent Dressing Assistance: Limited assistance     Functional Limitations Info  Sight, Hearing, Speech Sight Info: Adequate Hearing Info: Adequate Speech Info: Adequate    SPECIAL CARE FACTORS FREQUENCY  PT (By licensed PT), OT (By licensed OT)     PT Frequency: 5x/week OT Frequency: 5x/week            Contractures Contractures Info: Not present    Additional Factors Info  Code Status Code Status Info: Partial Code Allergies Info: no known allergiex           Current Medications (10/29/2019):  This is the current hospital active medication list Current Facility-Administered Medications  Medication Dose Route Frequency Provider Last Rate Last Admin  . levETIRAcetam (KEPPRA) IVPB 500 mg/100 mL premix  500 mg Intravenous Q12H Agyei, Obed K, MD 400 mL/hr at 10/29/19 0948 500 mg at 10/29/19 0948  . pantoprazole (PROTONIX) injection 40 mg  40 mg Intravenous Q12H Agyei, Obed K, MD   40 mg at 10/29/19 0941  . polyethylene glycol (MIRALAX / GLYCOLAX) packet 17 g  17 g Oral Daily Brahmbhatt, Parag, MD   17 g at 10/29/19 5176     Discharge Medications: Please see discharge summary for a list of discharge medications.  Relevant Imaging Results:  Relevant Lab Results:   Additional Information SS# [id number]  Bethann Berkshire, LCSW

## 2019-10-29 NOTE — TOC Progression Note (Addendum)
Transition of Care [facility]) - Progression Note   Patient Details  Name: Caitlin Cervantes MRN: [id number] Date of Birth: 03/04/1941  Transition of Care [facility]) CM/SW [place], [place] Phone Number: 10/29/2019, 2:45 PM  Clinical Narrative:      CSW met with pt. And son, [name], to discuss SNF facility choice. CSW shared bed offers received and medicare ratings of facilities. Pt. And son discussed and decided that pt would return to [facility]. Pt initially refused [facility] because she thought she might be able to return home instead and reports that she did have a positive experience there previously. CSW has reached out to [facility] to accept bed offer. Covid test ordered.    Expected Discharge Plan: [place] Barriers to Discharge: Continued Medical Work up  Expected Discharge Plan and Services Expected Discharge Plan: [place] arrangements for the past 2 months: [place], [place]                                       Social Determinants of Health (SDOH) Interventions    Readmission Risk Interventions No flowsheet data found.

## 2019-10-29 NOTE — Progress Notes (Signed)
[facility] Gastroenterology Progress Note  Caitlin Cervantes 79 y.o. July 08, 1941  CC: Anemia, dark stools, occult blood positive stool   Subjective: Patient seen and examined at bedside.  Family at bedside.  Patient denies any overt bleeding.  Hemoglobin improved appropriately with 2 units of blood transfusion.  Complaining of constipation with no bowel movement since admission.  ROS : Afebrile.  Negative for chest pain   Objective: Vital signs in last 24 hours: Vitals:   10/29/19 0313 10/29/19 0314  BP: 118/64   Pulse: 72 69  Resp: 15 20  Temp: 98.4 F (36.9 C)   SpO2: 100% 100%    Physical Exam:  General.  Elderly, cooperative, not in acute distress Abdomen.  Soft, nontender, nondistended, bowel sounds present.  No peritoneal signs Psych.  Mood and affect normal Neuro.  Alert/oriented x3  Lab Results: Recent Labs    10/28/19 0836 10/29/19 0244  NA 138 138  K 4.2 3.9  CL 105 104  CO2 22 23  GLUCOSE 96 82  BUN 18 16  CREATININE 1.24* 1.20*  CALCIUM 8.1* 8.1*   Recent Labs    10/27/19 1800 10/28/19 0836  AST 26 29  ALT 19 20  ALKPHOS 73 71  BILITOT 0.8 0.8  PROT 5.3* 5.4*  ALBUMIN 2.1* 2.2*   Recent Labs    10/27/19 1800 10/27/19 1800 10/28/19 0836 10/29/19 0244  WBC 16.6*   < > 11.2* 10.6*  NEUTROABS 13.9*  --   --   --   HGB 7.3*   < > 11.1* 10.4*  HCT 23.9*   < > 33.2* 31.4*  MCV 89.8   < > 87.1 88.5  PLT 318   < > 240 208   < > = values in this interval not displayed.   Recent Labs    10/27/19 1800  LABPROT 13.4  INR 1.0      Assessment/Plan: -Dark stools, occult blood positive stool.  Could be from Cameron's lesions or tiny gastric and duodenal ulcers that was seen on recent EGD.  GI bleeding scan negative. -Chronic blood loss anemia.  Mild drop in hemoglobin noted -Constipation.  Recommendations ------------------------ -Appropriate rise in hemoglobin from blood transfusion. -Continue IV PPI for now. -Advance diet to soft. -Add  MiraLAX -Hopefully discharge tomorrow if hemoglobin stable.   Otis Brace MD, [place] 10/29/2019, 9:21 AM  Contact #  [phone]

## 2019-10-29 NOTE — TOC Initial Note (Addendum)
Transition of Care (TOC) - Initial/Assessment Note   CSW received consult for possible SNF placement at time of discharge. CSW spoke with patient regarding PT recommendation of SNF placement at time of discharge. Patient reported that patient is unable to return home given patient's current physical needs and fall risk. Patient expressed understanding of PT recommendation and is agreeable to SNF placement at time of discharge. CSW informed pt. Of potential copay days at [facility] after pt. uses her 20 days of medicare SNF coverage.Patient came from [facility] [place] though she said she does not want to return to [facility]; did not give a clear reason .   CSW called son and informed son of plan for SNF. CSW also informed son of potential copay days at [facility] after pt. uses her 20 days of medicare SNF coverage. CSW also informed son of potential benefits to pursuing medicaid as a long-term strategy for his mother's care. Son is agreeable to plans and will follow up with his mother.   CSW completed SNF workup, waiting on bed offers. CSW to continue to follow and assist with discharge planning needs.  Patient Details  Name: Caitlin Cervantes MRN: [id number] Date of Birth: July 27, 1940  Transition of Care [facility]) CM/SW Contact:    Bethann Berkshire, [place] Phone Number: 10/29/2019, 12:13 PM  Clinical Narrative:                   Expected Discharge Plan: Skilled Nursing Facility Barriers to Discharge: Continued Medical Work up   Patient Goals and CMS Choice Patient states their goals for this hospitalization and ongoing recovery are:: Rehab facility      Expected Discharge Plan and Services Expected Discharge Plan: [place]       Living arrangements for the past 2 months: [place], [place]                                      Prior Living Arrangements/Services Living arrangements for the past 2 months: [place], [place] Lives  with:: Self, Facility Resident Patient language and need for interpreter reviewed:: Yes Do you feel safe going back to the place where you live?: No   Need SNF  Need for Family Participation in Patient Care: Yes (Comment)(Son, [name] [phone]) Care giver support system in place?: No (comment)   Criminal Activity/Legal Involvement Pertinent to Current Situation/Hospitalization: No - Comment as needed  Activities of Daily Living      Permission Sought/Granted Permission sought to share information with : Family Supports Permission granted to share information with : Yes, Verbal Permission Granted  Share Information with NAME: Evans,[name] (Sister) , Yailen Zemaitis (Son)           Emotional Assessment Appearance:: Appears stated age Attitude/Demeanor/Rapport: Lethargic, Gracious Affect (typically observed): Pleasant, Calm Orientation: : Oriented to Self, Oriented to Place, Oriented to Situation, Oriented to  Time Alcohol / Substance Use: Not Applicable Psych Involvement: No (comment)  Admission diagnosis:  Acute GI bleeding [K92.2] Gastrointestinal hemorrhage, unspecified gastrointestinal hemorrhage type [K92.2] Patient Active Problem List   Diagnosis Date Noted  . Acute GI bleeding 10/27/2019  . Pressure injury of skin 10/13/2019  . Polyarthritis 10/10/2019  . Pseudogout of knee, right 10/08/2019  . Hiatal hernia 10/06/2019  . Knee pain 10/06/2019  . Dementia ([place]) 10/06/2019  . Foot pain 10/06/2019  . Combined gastric and duodenal  ulcer 10/06/2019  . Acute renal failure superimposed on stage 3b chronic kidney disease ([place]) 09/30/2019  . Increased anion gap metabolic acidosis 70/34/0352  . Leukocytosis 09/30/2019  . Iron deficiency anemia 09/30/2019  . Ischemic vascular disease 05/05/2019  . Stage III chronic kidney disease 05/22/2018  . Diastolic dysfunction 48/18/5909  . Osteoarthritis of right lower extremity 12/08/2016  . Overweight (BMI 25.0-29.9) 12/08/2016   . Healthcare maintenance 09/17/2015  . Seizures ([place])   . Hyperlipidemia 05/23/2015   PCP:  Axel Filler, MD Pharmacy:   [facility] [address] - [place], [place] [address] [place] [address] Phone: [phone] Fax: [phone]  [place], [place] [address] Phone: [phone] Fax: [phone]     Social Determinants of Health (SDOH) Interventions    Readmission Risk Interventions No flowsheet data found.

## 2019-10-29 NOTE — Progress Notes (Signed)
  Subjective:  Patient seen at bedside. Patient denies bleeding, dizziness, difficulty breathing. Patient counseled on plan of care, all questions answered.   Objective:    Vital Signs (last 24 hours): Vitals:   10/29/19 0256 10/29/19 0312 10/29/19 0313 10/29/19 0314  BP:   118/64   Pulse: 70 75 72 69  Resp: (!) 21 14 15 20   Temp:   98.4 F (36.9 C)   TempSrc:   Oral   SpO2: 96% 100% 100% 100%    Physical Exam: General Alert and answers questions appropriately, no acute distress  Cardiac Regular rate and rhythm, no murmurs, rubs, or gallops  Pulmonary Clear to auscultation bilaterally without wheezes, rhonchi, or rales   CBC Latest Ref Rng & Units 10/29/2019 10/28/2019 10/27/2019  WBC 4.0 - 10.5 K/uL 10.6(H) 11.2(H) 16.6(H)  Hemoglobin 12.0 - 15.0 g/dL 10.4(L) 11.1(L) 7.3(L)  Hematocrit 36.0 - 46.0 % 31.4(L) 33.2(L) 23.9(L)  Platelets 150 - 400 K/uL 208 240 318   BMP Latest Ref Rng & Units 10/29/2019 10/28/2019 10/27/2019  Glucose 70 - 99 mg/dL 82 96 173(H)  BUN 8 - 23 mg/dL 16 18 18   Creatinine 0.44 - 1.00 mg/dL 1.20(H) 1.24(H) 1.23(H)  BUN/Creat Ratio 12 - 28 - - -  Sodium 135 - 145 mmol/L 138 138 134(L)  Potassium 3.5 - 5.1 mmol/L 3.9 4.2 3.9  Chloride 98 - 111 mmol/L 104 105 102  CO2 22 - 32 mmol/L 23 22 20(L)  Calcium 8.9 - 10.3 mg/dL 8.1(L) 8.1(L) 7.8(L)    Assessment/Plan:   Principal Problem:   Acute GI bleeding  Patient is a 79 year old female with past medical history significant for prior GI bleed, diverticulosis, hypertension, hyperlipidemia, iron with anemia, CKD stage III, dementia, and pseudogout who presented to [facility] on 10/27/2019 with painless hematochezia thought to be secondary to diverticular bleed.  # Lower GI Bleed: Patient with 4-day history of bright red blood per rectum and dark stools.  Patient was asymptomatic but mildly tachycardic and hypertensive on admission but hemoglobin of 7.3.  GI was consulted, patient received 2 units PRBCs, started on  Protonix IV twice daily.  Hemoglobin with appropriate response, 10.6 this AM.  Nuclear medicine scan on admission was without evidence for bleeding. *GI following, we appreciate their continued recommendations.  They are considering repeat EGD with capsule deployment if hemoglobin does not remain stable *Goal hemoglobin greater than 8 *Continue Protonix 40 mg IV twice daily *Advance from full liquid to soft diet *Daily CBC  # History of seizures: Continue home keppra 500 mg daily  PT/OT: Consulted, PT recommend SNF, will followup with patient Diet: Soft diet DVT Ppx: SCDs, no pharmacologic VTE ppx 2/2 bleeding Admit Status: Inpatient Dispo: Anticipated discharge tomorrow if hemoglobin remains stable  Jeanmarie Hubert, MD 10/29/2019, 6:42 AM

## 2019-10-30 DIAGNOSIS — L98419 Non-pressure chronic ulcer of buttock with unspecified severity: Secondary | ICD-10-CM | POA: Diagnosis not present

## 2019-10-30 LAB — CBC
HCT: 33.5 % — ABNORMAL LOW (ref 36.0–46.0)
Hemoglobin: 11.1 g/dL — ABNORMAL LOW (ref 12.0–15.0)
MCH: 28.9 pg (ref 26.0–34.0)
MCHC: 33.1 g/dL (ref 30.0–36.0)
MCV: 87.2 fL (ref 80.0–100.0)
Platelets: 189 10*3/uL (ref 150–400)
RBC: 3.84 MIL/uL — ABNORMAL LOW (ref 3.87–5.11)
RDW: 16.7 % — ABNORMAL HIGH (ref 11.5–15.5)
WBC: 10.5 10*3/uL (ref 4.0–10.5)
nRBC: 0 % (ref 0.0–0.2)

## 2019-10-30 LAB — BASIC METABOLIC PANEL
Anion gap: 10 (ref 5–15)
BUN: 11 mg/dL (ref 8–23)
CO2: 22 mmol/L (ref 22–32)
Calcium: 8 mg/dL — ABNORMAL LOW (ref 8.9–10.3)
Chloride: 104 mmol/L (ref 98–111)
Creatinine, Ser: 0.99 mg/dL (ref 0.44–1.00)
GFR calc Af Amer: >60 mL/min (ref >60)
GFR calc non Af Amer: 55 mL/min — ABNORMAL LOW (ref >60)
Glucose, Bld: 93 mg/dL (ref 70–99)
Potassium: 4.2 mmol/L (ref 3.5–5.1)
Sodium: 136 mmol/L (ref 135–145)

## 2019-10-30 MED ORDER — PANTOPRAZOLE SODIUM 40 MG PO TBEC: 40.0000 mg | DELAYED_RELEASE_TABLET | Freq: Two times a day (BID) | ORAL | 0 refills | Status: AC

## 2019-10-30 MED ORDER — PANTOPRAZOLE SODIUM 40 MG PO TBEC
40.0000 mg | DELAYED_RELEASE_TABLET | Freq: Two times a day (BID) | ORAL | Status: DC
  Administered 2019-10-30: 40 mg via ORAL
  Filled 2019-10-30: qty 1

## 2019-10-30 NOTE — Progress Notes (Signed)
Pt discharged via [facility] to [facility]. IV removed, peri care provided before DC. All belongings sent with pt. [name] (son) updated transportation arrived.

## 2019-10-30 NOTE — Progress Notes (Signed)
  Subjective:  Patient examined bedside. Denies knowledge of any bleeding overnight. Agreeable to going to rehab. Patient reports she feels well and looking forward to discharge. All questions and concerns addressed.    Objective:    Vital Signs (last 24 hours): Vitals:   10/29/19 0314 10/29/19 1200 10/29/19 2000 10/30/19 0500  BP:  119/76 (!) 148/71 140/72  Pulse: 69 77 (!) 49 70  Resp: 20 20 19 10   Temp:  98.6 F (37 C) 99.1 F (37.3 C) 98.2 F (36.8 C)  TempSrc:  Oral Oral Oral  SpO2: 100% 98% 95% 99%   Physical Exam: General Alert and answers questions appropriately, no acute distress  Cardiac Regular rate and rhythm, no murmurs, rubs, or gallops  Pulmonary Clear to auscultation bilaterally without wheezes, rhonchi, or rales   CBC Latest Ref Rng & Units 10/29/2019 10/28/2019 10/27/2019  WBC 4.0 - 10.5 K/uL 10.6(H) 11.2(H) 16.6(H)  Hemoglobin 12.0 - 15.0 g/dL 10.4(L) 11.1(L) 7.3(L)  Hematocrit 36.0 - 46.0 % 31.4(L) 33.2(L) 23.9(L)  Platelets 150 - 400 K/uL 208 240 318    Assessment/Plan:   Principal Problem:   Acute GI bleeding   Patient is a 79 year old female with past medical history significant for prior GI bleed, diverticulosis, hypertension, hyperlipidemia, iron deficiency anemia, CKD stage III, dementia, and pseudogout who presented to [facility] on 10/27/2019 with painless hematochezia thought to be secondary to diverticular bleed.  # Lower GI Bleed: Patient presented with 4-day history of bright red blood per rectum and dark stools.  Patient was asymptomatic but mildly tachycardic and hypotensive on admission with hemoglobin of 7.3.  GI was consulted and patient received 2 units PRBCs, started on Protonix IV twice daily.  Hemoglobin was with appropriate response, 10.6-day following transfusion, hemoglobin this AM of 11.1 *GI signed off *Goal hemoglobin greater than 8 *Will discharge on pantoprazole 40 mg twice daily for 4 weeks followed by once daily per GI  recommendation *Will discharge with instructions to advance diet as tolerated  PT/OT: Recommend SNF, rolling walker with 5 inch wheels Diet: Soft DVT Ppx: SCDs Admit Status: Inpatient Dispo: Medical cleared for discharge, discharge pending SNF placement  Jeanmarie Hubert, MD 10/30/2019, 6:00 AM

## 2019-10-30 NOTE — Progress Notes (Signed)
Internal Medicine Attending:   I saw and examined the patient. I reviewed the resident's note and I agree with the resident's findings and plan as documented in the resident's note.  Patient was initially made to the hospital with worsening anemia and bright of blood per rectum as well as episodes of dark stool.  Patient's hemoglobin has stabilized status post 2 units PRBC on admission.  No further episodes of bleeding here.  We will continue with PPI twice daily for 4 weeks followed by once daily PPI.  No further work-up at this time.  GI follow-up and recommendations appreciated.  Patient stable for DC to SNF today.

## 2019-10-30 NOTE — Discharge Summary (Signed)
Name: Caitlin Cervantes MRN: [id number] DOB: [birth year] 79 y.o. PCP: Axel Filler, MD  Date of Admission: 10/27/2019  5:36 PM Date of Discharge: 10/30/2019 Attending Physician: Aldine Contes, MD  Discharge Diagnosis: 1. Lower GI bleed  Discharge Medications: Allergies as of 10/30/2019   No Known Allergies     Medication List    TAKE these medications   amLODipine 2.5 MG tablet Commonly known as: NORVASC TAKE ONE TABLET BY MOUTH DAILY   atorvastatin 40 MG tablet Commonly known as: LIPITOR TAKE 1 TABLET (40 MG TOTAL) BY MOUTH DAILY AT 6 PM.   donepezil 5 MG tablet Commonly known as: Aricept Take 1 tablet (5 mg total) by mouth at bedtime.   iron polysaccharides 150 MG capsule Commonly known as: Nu-Iron Take 1 capsule (150 mg total) by mouth daily.   levETIRAcetam 500 MG tablet Commonly known as: KEPPRA TAKE ONE TABLET BY MOUTH TWICE A DAY   pantoprazole 40 MG tablet Commonly known as: PROTONIX Take 1 tablet (40 mg total) by mouth 2 (two) times daily for 28 days. What changed: when to take this            Durable Medical Equipment  (From admission, onward)         Start     Ordered   10/30/19 0605  For home use only DME Walker rolling  Once    Question Answer Comment  Walker: With [place] Wheels   Patient needs a walker to treat with the following condition Muscular deconditioning      10/30/19 0605          Disposition and follow-up:   [name] was discharged from [facility] in Stable condition.  At the hospital follow up visit please address:  1.  Please assess patient for signs of recurrent bleeding.  Per GI recommendation, patient to be on pantoprazole 40 mg twice daily for 4 weeks followed by pantoprazole 40 mg once daily  2.  Labs / imaging needed at time of follow-up: CBC (recommended to be checked in approximately 5 days)  3.  Pending labs/ test needing follow-up: None  Follow-up Appointments:  Contact information for after-discharge care    Destination    [facility] Preferred SNF .   Service: Skilled Nursing Contact information: [address] [place] [facility] Course by problem list: # Lower GI Bleed: Patient with approximately 4 days of bright red blood per rectum and dark stools. Patient was asymptomatic but tachycardic and mildly hypotensive on admission with hemoglobin of 7.3. During prior encounter (09/30/19-10/02/19), patient had asymptomatic anemia to hemoglobin of 3.9. At that time, EGD demonstrated non-bleeding gastric and duodenal ulcers. Biopsies were without evidence for H. Pylori or dysplasia on pathologic analysis. Colonoscopy demonstrated diverticulosis in distal colon and polyps in the transverse colon. During this current encounter, GI was consulted, patient placed on IV fluids, transfused 2 units PRBCS, and given protonix IV BID. Post-transfusion hemoglobin of 11.2. Nuclear medicine GI bleeding scan was performed and was without evidence for bleeding. Patient remained without signs of recurrent bleeding and hemoglobin remained stable, value of 11.1 on day of discharge. Patient was on soft diet on day of discharge and was discharged with instructions to increase to regular diet as tolerated.  Discharge Vitals:   BP 140/72 (BP Location: Right Arm)   Pulse 70   Temp 98.2 F (36.8  C) (Oral)   Resp 10   LMP  (LMP Unknown)   SpO2 99%   Pertinent Labs, Studies, and Procedures:   CBC Latest Ref Rng & Units 10/30/2019 10/29/2019 10/28/2019  WBC 4.0 - 10.5 K/uL 10.5 10.6(H) 11.2(H)  Hemoglobin 12.0 - 15.0 g/dL 11.1(L) 10.4(L) 11.1(L)  Hematocrit 36.0 - 46.0 % 33.5(L) 31.4(L) 33.2(L)  Platelets 150 - 400 K/uL 189 208 240   BMP Latest Ref Rng & Units 10/30/2019 10/29/2019 10/28/2019  Glucose 70 - 99 mg/dL 93 82 96  BUN 8 - 23 mg/dL 11 16 18   Creatinine 0.44 - 1.00 mg/dL 0.99 1.20(H) 1.24(H)  BUN/Creat Ratio  12 - 28 - - -  Sodium 135 - 145 mmol/L 136 138 138  Potassium 3.5 - 5.1 mmol/L 4.2 3.9 4.2  Chloride 98 - 111 mmol/L 104 104 105  CO2 22 - 32 mmol/L 22 23 22   Calcium 8.9 - 10.3 mg/dL 8.0(L) 8.1(L) 8.1(L)   NM GI Bleed Loss (10/27/2019): FINDINGS: There is normal distribution of radiotracer in the abdomen. There is accumulation of radiotracer inferior to the aortic bifurcation likely within the patient's pelvis. This radiotracer does not appear to migrate and is favored to represent radiotracer within the urinary bladder. There is no evidence for active GI bleeding.   IMPRESSION: No evidence for active GI bleeding on this study.  EGD (10/02/2019, Prior hospitalization): IMPRESSION: Widely patent [name]. - 6 cm hiatal hernia. - Non-bleeding gastric ulcers with pigmented material. Biopsied. - Erythematous mucosa in the antrum. Biopsied. - Non-bleeding duodenal ulcers with a clean ulcer base (Forrest Class III). Biopsied.  FINAL MICROSCOPIC DIAGNOSIS:  A. DUODENUM, BIOPSY:  - Peptic duodenitis.  - No dysplasia or malignancy.   B. STOMACH, ANTRUM, BIOPSY:  - Reactive gastropathy.  - Warthin-Starry is negative for Helicobacter pylori.  - No intestinal metaplasia, dysplasia, or malignancy.   C. STOMACH, ABNORMAL BODY MUCOSA, BIOPSY:  - Reactive gastropathy with erosions.  - No intestinal metaplasia, dysplasia, or malignancy.   D. COLON, TRANSVERSE, POLYPECTOMY:  - Tubular adenoma.  - No high grade dysplasia or malignancy.  Colonoscopy (10/02/2019, Prior hospitalization): IMPRESSION: - Preparation of the colon was fair. - Diverticulosis in the sigmoid colon, in the descending colon, in the transverse colon and at the hepatic flexure. - Stool in the transverse colon, at the hepatic flexure, in the ascending colon, in the cecum and at the appendiceal orifice. - Three 3 to 5 mm polyps in the transverse colon, removed piecemeal using a cold biopsy forceps. Resected and  retrieved. - The examination was otherwise normal on direct and retroflexion views.  Echocardiogram (10/11/19, Prior hospitalization): IMPRESSION: 1. Left ventricular ejection fraction, by estimation, is 70 to 75%. The  left ventricle has hyperdynamic function. The left ventricle has no  regional wall motion abnormalities. There is mild left ventricular  hypertrophy (LV wall thickness measurements  are not correct). Left ventricular diastolic parameters are consistent  with Grade I diastolic dysfunction (impaired relaxation). The LVOT is  relatively small with turbulent flow (could correlate with a systolic  murmur).   2. Right ventricular systolic function is normal. The right ventricular  size is normal.   3. The mitral valve is grossly normal with mild annular calcification.  Mild mitral valve regurgitation.   4. The aortic valve is tricuspid. Aortic valve regurgitation is not  visualized. Mild aortic valve sclerosis is present, with no evidence of  aortic valve stenosis.   5. IVC not visualized.   6. Trivial circumferential pericardial  effusion.   Discharge Instructions: Discharge Instructions    Diet - low sodium heart healthy   Complete by: As directed    Discharge instructions   Complete by: As directed    You were seen in the hospital for rectal bleeding. You were given a blood transfusion and your blood counts have remained stable. Please call your doctor or return to the hospital if you have signs of new bleeding. After you are discharged from the rehab facility, please schedule an appointment with your primary care doctor (Dr. Evette Doffing).  In about 5 days you should get your blood counts checked again. These can be checked at the rehabilitation facility you are going to.  Thank you for allowing [place] to be part of your medical care!   Increase activity slowly   Complete by: As directed       Signed: Jeanmarie Hubert, MD 10/30/2019, 10:13 AM

## 2019-10-30 NOTE — TOC Transition Note (Signed)
Transition of Care [facility]) - CM/SW Discharge Note   Patient Details  Name: ANYE BROSE MRN: [id number] Date of Birth: [birth year]  [facility]) CM/SW Contact:  Bethann Berkshire, [place] Phone Number: 10/30/2019, 11:59 AM   Clinical Narrative:     Patient will DC to: [place] date: 10/30/19 Family notified: Son, [name] Transport by: Corey Harold   Per MD patient ready for DC to [facility]. RN, patient, patient's family, and facility notified of DC. Discharge Summary and FL2 sent to facility. RN to call report prior to discharge ([phone], Room 107a). DC packet on chart. Ambulance transport requested for patient.   CSW will sign off for now as social work intervention is no longer needed. Please consult [place] again if new needs arise.    Final next level of care: Skilled Nursing Facility Barriers to Discharge: Continued Medical Work up   Patient Goals and CMS Choice Patient states their goals for this hospitalization and ongoing recovery are:: Rehab facility   Choice offered to / list presented to : Patient, Adult Children  Discharge Placement   Existing PASRR number confirmed : 10/30/19          Patient chooses bed at: [facility] Patient to be transferred to facility by: [place] Name of family member notified: Chancy Hurter Patient and family notified of of transfer: 10/30/19  Discharge Plan and Services                                     Social Determinants of Health (SDOH) Interventions     Readmission Risk Interventions No flowsheet data found.

## 2019-10-30 NOTE — Consult Note (Signed)
   [facility] Inpatient Consult   10/30/2019  Caitlin Cervantes May 09, 1941 [id number]   [facility] Follow up:  Medicare Next[employer] ACO  Patient returned to [name] today.  Plan:  Will have [facility] RN follow at facility.  Natividad Brood, RN BSN [facility] Liaison  [phone] business mobile phone Toll free office [phone]  Fax number: [phone] [email or web address]

## 2019-10-30 NOTE — Progress Notes (Signed)
[facility] Gastroenterology Progress Note  Caitlin Cervantes 79 y.o. Apr 13, 1941  CC: Anemia, dark stools, occult blood positive stool   Subjective: Patient seen and examined at bedside.  Resting comfortably.  Denies any further bleeding episodes.  Denies abdominal pain, nausea and vomiting  ROS : Afebrile.  Negative for chest pain   Objective: Vital signs in last 24 hours: Vitals:   10/29/19 2000 10/30/19 0500  BP: (!) 148/71 140/72  Pulse: (!) 49 70  Resp: 19 10  Temp: 99.1 F (37.3 C) 98.2 F (36.8 C)  SpO2: 95% 99%    Physical Exam:  General.  Elderly, cooperative, not in acute distress Abdomen.  Soft, nontender, nondistended, bowel sounds present.  No peritoneal signs Psych.  Mood and affect normal Neuro.  Alert/oriented x3  Lab Results: Recent Labs    10/29/19 0244 10/30/19 0638  NA 138 136  K 3.9 4.2  CL 104 104  CO2 23 22  GLUCOSE 82 93  BUN 16 11  CREATININE 1.20* 0.99  CALCIUM 8.1* 8.0*   Recent Labs    10/27/19 1800 10/28/19 0836  AST 26 29  ALT 19 20  ALKPHOS 73 71  BILITOT 0.8 0.8  PROT 5.3* 5.4*  ALBUMIN 2.1* 2.2*   Recent Labs    10/27/19 1800 10/28/19 0836 10/29/19 0244 10/30/19 0821  WBC 16.6*   < > 10.6* 10.5  NEUTROABS 13.9*  --   --   --   HGB 7.3*   < > 10.4* 11.1*  HCT 23.9*   < > 31.4* 33.5*  MCV 89.8   < > 88.5 87.2  PLT 318   < > 208 189   < > = values in this interval not displayed.   Recent Labs    10/27/19 1800  LABPROT 13.4  INR 1.0      Assessment/Plan: -Dark stools, occult blood positive stool.  Could be from Cameron's lesions or tiny gastric and duodenal ulcers that was seen on recent EGD.  GI bleeding scan negative. -Acute on chronic blood loss anemia.    Hemoglobin stable -Constipation.  Recommendations ------------------------ -Repeat CBC showed hemoglobin of 11.1 -Change Protonix to p.o. pantoprazole 40 mg twice daily.  Continue twice daily PPI for 4 weeks followed by once a day PPI -Continue as  needed MiraLAX -Advance diet as tolerated -No further inpatient GI work-up planned. -GI will sign off.  Call us back if needed   Otis Brace MD, [place] 10/30/2019, 9:09 AM  Contact #  [phone]

## 2019-10-30 NOTE — Progress Notes (Signed)
Report called to Randell Patient, RN at [facility] center. Pt to discharge to room 107 A. SW setting up transportation. Son updated on discharge.

## 2019-11-06 ENCOUNTER — Other Ambulatory Visit: Payer: Self-pay | Admitting: *Deleted

## 2019-11-06 DIAGNOSIS — L98419 Non-pressure chronic ulcer of buttock with unspecified severity: Secondary | ICD-10-CM | POA: Diagnosis not present

## 2019-11-06 DIAGNOSIS — I10 Essential (primary) hypertension: Secondary | ICD-10-CM | POA: Diagnosis not present

## 2019-11-06 DIAGNOSIS — R569 Unspecified convulsions: Secondary | ICD-10-CM | POA: Diagnosis not present

## 2019-11-06 DIAGNOSIS — D62 Acute posthemorrhagic anemia: Secondary | ICD-10-CM | POA: Diagnosis not present

## 2019-11-06 NOTE — Patient Outreach (Signed)
Member screened for potential [employer] Care Management needs as a benefit of [place] Medicare.  Member is receiving skilled therapy at [facility] SNF.   Will plan outreach to family to discuss potential [facility] needs and transition plans as appropriate.    Marthenia Rolling, MSN-Ed, RN,BSN [place] Acute Care Coordinator [phone] [facility] [phone]  (Toll free office)

## 2019-11-10 ENCOUNTER — Other Ambulatory Visit: Payer: Self-pay | Admitting: *Deleted

## 2019-11-10 NOTE — Patient Outreach (Signed)
[facility] Post- Acute Care Coordinator follow up  Telephone call received back from son Tiera Mensinger. Patent identifiers confirmed. [name] states they are hopeful member can return home. [name] works during the day however from 9 am to 5 pm. [name] endorses that [employer] application has been submitted. Discussed alternative plans should be considered in case Ms. Atkins cannot return home.   Explained that member's PCP office has [place] Management team. Discussed that writer will continue to follow for transition plans while member resides in SNF.  Also discussed Remote Health services in case member returns home. [name] is agreeable. Will make Remote Health referral if needed. Will continue to follow while in SNF.   Marthenia Rolling, MSN-Ed, RN,BSN [place] Acute Care Coordinator [phone] [facility]) [phone]  (Toll free office)

## 2019-11-10 NOTE — Patient Outreach (Signed)
[place] Coordinator follow up  Caitlin Cervantes is receiving skilled therapy at [facility].  Telephone call made to member's son [name] to discuss transition plans and potential [facility] services. No answer and unable to leave voice mail message on [phone]. Left HIPAA compliant voice mail message on [phone].  Will plan outreach again as appropriate. Will continue to follow for transition plans and [facility] needs while member resides in SNF.   Noted member's PCP has [facility] ECM team at [facility]. Will update THN ECM team accordingly.   Marthenia Rolling, MSN-Ed, RN,BSN [place] Acute Care Coordinator [phone] [facility]) [phone]  (Toll free office)     .

## 2019-11-18 DIAGNOSIS — R5381 Other malaise: Secondary | ICD-10-CM | POA: Diagnosis not present

## 2019-11-18 DIAGNOSIS — D500 Iron deficiency anemia secondary to blood loss (chronic): Secondary | ICD-10-CM | POA: Diagnosis not present

## 2019-11-18 DIAGNOSIS — R5383 Other fatigue: Secondary | ICD-10-CM | POA: Diagnosis not present

## 2019-11-18 DIAGNOSIS — K625 Hemorrhage of anus and rectum: Secondary | ICD-10-CM | POA: Diagnosis not present

## 2019-11-18 DIAGNOSIS — M6281 Muscle weakness (generalized): Secondary | ICD-10-CM | POA: Diagnosis not present

## 2019-11-18 DIAGNOSIS — F0390 Unspecified dementia without behavioral disturbance: Secondary | ICD-10-CM | POA: Diagnosis not present

## 2019-11-20 ENCOUNTER — Other Ambulatory Visit: Payer: Self-pay | Admitting: *Deleted

## 2019-11-20 NOTE — Patient Outreach (Signed)
[place] Coordinator follow up  Ms. Rashid remains at [facility] SN[facility] for skilled therapy.  Spoke with Chancy Hurter (son/DPR) [phone]. Patient identifiers confirmed. [name] endorses his mother will return home at [facility] dc. States his aunt will assist as well. [name] reports he works during the day. Discussed medical alert system. [name] is not sure when member will dc from SNF. Likely next week per [name]. States facility is working on IT trainer. They have 11-12 steps at home.   Discussed writer will reach out to [place] Management team at [facility] to make aware of Ms. Stemler pending transition to home soon.   Discussed [facility] services as Ms. Grippi is high risk for readmission. [name] is agreeable.   Will continue to follow for transition plans. Will alert THN ECM team of member's pending transition to home soon and that she is high risk for readmission. Will make referral to [facility].   Marthenia Rolling, MSN-Ed, RN,BSN [place] Acute Care Coordinator [phone] [facility]) [phone]  (Toll free office)

## 2019-11-21 DIAGNOSIS — F0390 Unspecified dementia without behavioral disturbance: Secondary | ICD-10-CM | POA: Diagnosis not present

## 2019-11-21 DIAGNOSIS — R627 Adult failure to thrive: Secondary | ICD-10-CM | POA: Diagnosis not present

## 2019-11-21 DIAGNOSIS — D500 Iron deficiency anemia secondary to blood loss (chronic): Secondary | ICD-10-CM | POA: Diagnosis not present

## 2019-11-21 DIAGNOSIS — R5381 Other malaise: Secondary | ICD-10-CM | POA: Diagnosis not present

## 2019-11-21 DIAGNOSIS — R5383 Other fatigue: Secondary | ICD-10-CM | POA: Diagnosis not present

## 2019-11-28 ENCOUNTER — Ambulatory Visit: Payer: Self-pay | Admitting: *Deleted

## 2019-11-28 DIAGNOSIS — F0390 Unspecified dementia without behavioral disturbance: Secondary | ICD-10-CM

## 2019-11-28 DIAGNOSIS — N1831 Chronic kidney disease, stage 3a: Secondary | ICD-10-CM

## 2019-11-28 NOTE — Chronic Care Management (AMB) (Signed)
  Chronic Care Management   Note  11/28/2019 Name: Caitlin Cervantes MRN: [id number] DOB: 11/09/1940   [name] (SNF)- patient remains a resident there.   Follow up plan: The care management team will reach out to the patient again over the next 7-14 days in response to referral received from [place] on 11/20/19.  Kelli Churn RN, CCM, [facility] RN Care Manager [phone]

## 2019-12-03 ENCOUNTER — Other Ambulatory Visit: Payer: Self-pay | Admitting: *Deleted

## 2019-12-03 NOTE — Patient Outreach (Addendum)
THN Post- Acute Care Coordinator follow up  Telephone call received from Chancy Hurter (son). Patient identifiers confirmed.   [name] confirms he spoke with facility on yesterday to discuss the need for 24/7 assistance. Writer emphasized to [name] that member will need someone to be with her to assist while he works during the day. Also discussed he should reconsider LTC placement if unable to provide 24/7 for Ms. Rubens at home.   [name] states he really wants to bring his mother home and that he will work on finding someone to stay with her while he works. Reiterated the importance of 24/7.  Medicaid application is pending. Will need to confirm whether facility Medicaid or [facility] application was submitted.   Todd states " I have a lot to consider. I will have to think about everything and see who I can get to stay with her." Discussed that writer will follow back up with him to find out status.   Will keep THN ECM team and Remote [facility] informed.  Addendum: Received confirmation from facility SW that Medicaid application was for LTC Medicaid and that son would have to notify [employer]HS for change in program for [facility]. Also received approval from [facility] for [facility] referral. This would entail nursing assistant services for assist with bathing and dressing for 3 days a week for about 2 weeks. [name] would still have to arrange 24/7 assistance otherwise. Telephone call made again to [facility] to make aware of information received by [facility] SW and to make aware of approval for  [facility], if they have availability, for several days a week for 2 weeks. Reiterated LTC placement should be reconsidered due to safety concerns for member possibly being home alone. [name] states he is unable to work from home. States he has used intermittent FMLA in the past. Discussed the type of assistance needed for member upon return home such as meal prep, medication administration,  ADLS, and etc. [name] states he is "considering all of it" and expressed appreciation of call. Will make [facility] referral in case member returns home. Facility SW indicates discharge date is for next Tuesday 12/09/19. Will continue to follow.     Marthenia Rolling, MSN-Ed, RN,BSN [place] Acute Care Coordinator [phone] [facility]) [phone]  (Toll free office)

## 2019-12-03 NOTE — Patient Outreach (Signed)
[place] Coordinator follow up  Ms. Genrich remains at [facility].  Information received from facility SW. Medicaid application was faxed to [facility] on 11/05/19. Per facility SW,  son [name] did not want to consider alternative plans previously. However, facility therapist was scheduled to to contact Todd to discuss the amount of assistance Ms. Mask will require at home.   Update was sent to [facility] team and [facility].   Telephone call made to [name] (son) [phone] to discuss transition plans. No answer. Left HIPAA compliant voicemail message to request return call.    Marthenia Rolling, MSN-Ed, RN,BSN [place] Acute Care Coordinator [phone] [facility]) [phone]  (Toll free office)

## 2019-12-04 ENCOUNTER — Other Ambulatory Visit: Payer: Self-pay | Admitting: *Deleted

## 2019-12-04 NOTE — Patient Outreach (Signed)
[place] Coordinator follow up  Telephone call received from Chancy Hurter (son/DPR). Patient identifiers confirmed. [name] reports he did not realize member was scheduled to transition to home next week. States he has had time to think about it and realizes he is unable to care for his mother at home since he works during the day. He now wants to reconsider long term care placement.   Message sent to facility SW to please contact Todd for long term care discussion.   Will continue to follow and assist as necessary. Will update THN ECM team once disposition plans are clear.   Marthenia Rolling, MSN-Ed, RN,BSN [place] Acute Care Coordinator [phone] [facility]) [phone]  (Toll free office)

## 2019-12-05 ENCOUNTER — Other Ambulatory Visit: Payer: Self-pay | Admitting: *Deleted

## 2019-12-05 DIAGNOSIS — M6281 Muscle weakness (generalized): Secondary | ICD-10-CM | POA: Diagnosis not present

## 2019-12-05 DIAGNOSIS — F339 Major depressive disorder, recurrent, unspecified: Secondary | ICD-10-CM | POA: Diagnosis not present

## 2019-12-05 DIAGNOSIS — F0390 Unspecified dementia without behavioral disturbance: Secondary | ICD-10-CM | POA: Diagnosis not present

## 2019-12-05 DIAGNOSIS — N1832 Chronic kidney disease, stage 3b: Secondary | ICD-10-CM | POA: Diagnosis not present

## 2019-12-05 NOTE — Patient Outreach (Signed)
THN Post- Acute Care Coordinator follow up  Telephone call made to son Rekisha Welling to confirm disposition plans. [name] reports member will remain at [facility] SNF for long term care. Medicaid is pending.   Communication sent to facility SW to verify and to ask if palliative care referral could be considered as well.   Communication sent to [facility] to make aware of change in plans. Will notify [facility] as well.  Will update [facility] Embedded Chronic Care Management team at [facility].   Will await confirmation from [facility] SW before signing off.  Marthenia Rolling, MSN-Ed, RN,BSN [place] Acute Care Coordinator [phone] [facility]) [phone]  (Toll free office)

## 2019-12-11 ENCOUNTER — Other Ambulatory Visit: Payer: Self-pay | Admitting: *Deleted

## 2019-12-11 NOTE — Patient Outreach (Signed)
THN Post- Acute Care Coordinator follow up   Verified with facility that member has transitioned to long term care at [facility].   No identifiable needs at this time.  Caitlin Rolling, MSN-Ed, RN,BSN [place] Acute Care Coordinator [phone] [facility]) [phone]  (Toll free office)

## 2019-12-15 DIAGNOSIS — F331 Major depressive disorder, recurrent, moderate: Secondary | ICD-10-CM | POA: Diagnosis not present

## 2019-12-15 DIAGNOSIS — G301 Alzheimer's disease with late onset: Secondary | ICD-10-CM | POA: Diagnosis not present

## 2019-12-18 DIAGNOSIS — L98419 Non-pressure chronic ulcer of buttock with unspecified severity: Secondary | ICD-10-CM | POA: Diagnosis not present

## 2019-12-23 ENCOUNTER — Telehealth: Payer: Self-pay | Admitting: *Deleted

## 2019-12-23 NOTE — Telephone Encounter (Signed)
I tried returning, but number went straight to voice mail. I left my direct office number as call back, hopefully will get in touch soon.

## 2019-12-23 NOTE — Telephone Encounter (Signed)
Call from patient's son [name] would like to talk with Dr. Evette Doffing about patient.  Message to be sent to Dr. Evette Doffing to call patient's son at [phone].  Sander Nephew , RN 12/23/2019 11:22 AM

## 2019-12-25 DIAGNOSIS — L98419 Non-pressure chronic ulcer of buttock with unspecified severity: Secondary | ICD-10-CM | POA: Diagnosis not present

## 2019-12-29 ENCOUNTER — Telehealth: Payer: Self-pay

## 2019-12-29 ENCOUNTER — Encounter: Payer: Medicare Other | Admitting: Student in an Organized Health Care Education/Training Program

## 2019-12-29 NOTE — Telephone Encounter (Signed)
Missed appointment patient is in [facility] [place], [place] C6/7/202110:42 AM

## 2019-12-29 NOTE — Telephone Encounter (Signed)
I spoke with patient's son late last week. Caitlin Cervantes' cognitive decline is progressing while she is at [facility]. She is requiring help with many activities of daily living. Also having weightloss due to low oral intake. We talked about supplementing with Boost, which he will do at least twice daily. He wants to bring Caitlin Cervantes home, but we talked about the amount of care she would need, and it seems that the safest place for her right now is SNF. I offered follow up appointment in the near future, he is going to consider his schedule and the logistics of transporting her to the [facility] for a visit.

## 2019-12-30 DIAGNOSIS — R3915 Urgency of urination: Secondary | ICD-10-CM | POA: Diagnosis not present

## 2019-12-30 DIAGNOSIS — F05 Delirium due to known physiological condition: Secondary | ICD-10-CM | POA: Diagnosis not present

## 2019-12-30 DIAGNOSIS — F0390 Unspecified dementia without behavioral disturbance: Secondary | ICD-10-CM | POA: Diagnosis not present

## 2019-12-30 DIAGNOSIS — R5381 Other malaise: Secondary | ICD-10-CM | POA: Diagnosis not present

## 2019-12-30 DIAGNOSIS — R5383 Other fatigue: Secondary | ICD-10-CM | POA: Diagnosis not present

## 2020-01-01 DIAGNOSIS — R350 Frequency of micturition: Secondary | ICD-10-CM | POA: Diagnosis not present

## 2020-01-01 DIAGNOSIS — R3915 Urgency of urination: Secondary | ICD-10-CM | POA: Diagnosis not present

## 2020-01-01 DIAGNOSIS — F0390 Unspecified dementia without behavioral disturbance: Secondary | ICD-10-CM | POA: Diagnosis not present

## 2020-01-01 DIAGNOSIS — R82998 Other abnormal findings in urine: Secondary | ICD-10-CM | POA: Diagnosis not present

## 2020-01-06 ENCOUNTER — Telehealth: Payer: Self-pay | Admitting: Student in an Organized Health Care Education/Training Program

## 2020-01-06 ENCOUNTER — Ambulatory Visit: Payer: Self-pay

## 2020-01-06 DIAGNOSIS — F0390 Unspecified dementia without behavioral disturbance: Secondary | ICD-10-CM

## 2020-01-06 NOTE — Chronic Care Management (AMB) (Signed)
°  Chronic Care Management   Outreach Note  01/06/2020 Name: Caitlin Cervantes MRN: [id number] DOB: 06/21/1941  Referred by: Axel Filler, MD Reason for referral : Care Coordination (Level of Care )  Attempted to contact patient's son, Caitlin Cervantes, as he contacted the clinic today regarding in-home service options for patient.  Left voicemail message on mobile number.  Provided him with clinic number as well as mobile number.  No answer or option to leave message at home number.   Ronn Melena, [employer] Coordination Social Worker [place] [phone]

## 2020-01-06 NOTE — Telephone Encounter (Signed)
Return call to pt's son Ahmari Garton - stated his mother is at [facility]. She was originally there for rehab but this was not working well with her so they wants to keep her there long term. But the son wants to bring her home with some type of assistance. He mentioned CAP and [facility] programs. Informed I will ask our CM/SW to call him back.

## 2020-01-06 NOTE — Telephone Encounter (Signed)
Patient's son [name] has questions about the patient's healthcare and would like for someone to call back.

## 2020-01-07 ENCOUNTER — Ambulatory Visit: Payer: Medicare Other

## 2020-01-07 DIAGNOSIS — N1831 Chronic kidney disease, stage 3a: Secondary | ICD-10-CM

## 2020-01-07 DIAGNOSIS — F0390 Unspecified dementia without behavioral disturbance: Secondary | ICD-10-CM

## 2020-01-07 NOTE — Chronic Care Management (AMB) (Signed)
°  Care Management   Social Work Note  01/07/2020 Name: Caitlin Cervantes MRN: [id number] DOB: 09-29-1940  Caitlin Cervantes is a 79 y.o. year old female who sees Evette Doffing, Mallie Mussel, MD for primary care. The Care Management team was consulted for assistance with Level of Care Concerns.   SDOH (Social Determinants of Health) assessments performed: No   Incoming call from patient's son in response to message left yesterday.  Son inquired about in-home care options for patient.  Informed son that it's in patients best interest to remain at SNF if 24/7 care is needed.  Did talk with son about in-home options and informed him that these services are only covered by Medicaid.  Educated son about CAP and informed him that wait list for services often exceeds two years.  Son states that patient was recently approved for Medicaid.  Educated son about difference between [place] Medicaid and Adult Medicaid that covers cost of in-home services.  Son would like to further discuss Medicaid eligibility with DSS Caseworker or SNF Social Worker.   Reiterated that SNF is best option for patient to receive 24/7 care.   Agreed to call son back next week after he has been able to gather more information about Medicaid eligibility.  Will talk with son at that time about other options for SNF placement.       Follow Up Plan: Appointment scheduled for SW follow up with patient's son by phone on: 01/16/20  Encouraged him to call if any additional questions/concerns arise before then.      Ronn Melena, [place] Coordination Social Worker [place] [phone]

## 2020-01-07 NOTE — Telephone Encounter (Signed)
Good idea. Caitlin Cervantes has struggled with significant functional decline due to progressive dementia the last year. She had a hospitalization that led to SNF placement for rehab, but has not been able to regain enough function to be safe to live at home independently. I strongly encouraged Caitlin Cervantes to continue with SNF level care. I think she would need 24 hour supervision if she were to go home. I will place a referral for CCM to help him work through his options and if there are any home services medicare can help with. Maybe he would be more comfortable with a different SNF?

## 2020-01-12 DIAGNOSIS — N390 Urinary tract infection, site not specified: Secondary | ICD-10-CM | POA: Diagnosis not present

## 2020-01-12 DIAGNOSIS — R531 Weakness: Secondary | ICD-10-CM | POA: Diagnosis not present

## 2020-01-12 DIAGNOSIS — R3915 Urgency of urination: Secondary | ICD-10-CM | POA: Diagnosis not present

## 2020-01-16 ENCOUNTER — Telehealth: Payer: Medicare Other

## 2020-01-16 ENCOUNTER — Ambulatory Visit: Payer: Self-pay

## 2020-01-16 NOTE — Chronic Care Management (AMB) (Signed)
  Chronic Care Management   Outreach Note  01/16/2020 Name: Caitlin Cervantes MRN: [id number] DOB: 06/21/1941  Referred by: Axel Filler, MD Reason for referral : Care Coordination (Level of Care Concerns)   Unsuccessful outreach to patient's son for follow up on level of care concerns.  Left message on mobile number. No answer or option to leave message on home number.    Follow Up Plan: Will attempt to reach again next week if no return call.     Ronn Melena, [place] Coordination Social Worker [place] [phone]

## 2020-01-19 DIAGNOSIS — N390 Urinary tract infection, site not specified: Secondary | ICD-10-CM | POA: Diagnosis not present

## 2020-01-19 DIAGNOSIS — F0390 Unspecified dementia without behavioral disturbance: Secondary | ICD-10-CM | POA: Diagnosis not present

## 2020-01-21 DIAGNOSIS — I10 Essential (primary) hypertension: Secondary | ICD-10-CM | POA: Diagnosis not present

## 2020-01-21 DIAGNOSIS — N189 Chronic kidney disease, unspecified: Secondary | ICD-10-CM | POA: Diagnosis not present

## 2020-01-21 DIAGNOSIS — D509 Iron deficiency anemia, unspecified: Secondary | ICD-10-CM | POA: Diagnosis not present

## 2020-01-23 ENCOUNTER — Ambulatory Visit: Payer: Self-pay

## 2020-01-23 ENCOUNTER — Telehealth: Payer: Medicare Other

## 2020-01-23 NOTE — Chronic Care Management (AMB) (Signed)
°  Chronic Care Management   Outreach Note  01/23/2020 Name: Caitlin Cervantes MRN: [id number] DOB: 05-02-1941  Referred by: Axel Filler, MD Reason for referral : Care Coordination (Level of care concerns)   Second unsuccessful outreach to patient's son for follow up on level of care concerns.  Left message on mobile number. No answer or option to leave message on home number.    Follow Up Plan: A HIPPA compliant phone message was left for the patient's son providing contact information and requesting a return call if additional assistance is needed.        Ronn Melena, [place] Coordination Social Worker [place] [phone]

## 2020-02-06 DIAGNOSIS — F0280 Dementia in other diseases classified elsewhere without behavioral disturbance: Secondary | ICD-10-CM | POA: Diagnosis not present

## 2020-02-06 DIAGNOSIS — F331 Major depressive disorder, recurrent, moderate: Secondary | ICD-10-CM | POA: Diagnosis not present

## 2020-02-06 DIAGNOSIS — G301 Alzheimer's disease with late onset: Secondary | ICD-10-CM | POA: Diagnosis not present

## 2020-02-09 DIAGNOSIS — F0390 Unspecified dementia without behavioral disturbance: Secondary | ICD-10-CM | POA: Diagnosis not present

## 2020-02-10 DIAGNOSIS — K449 Diaphragmatic hernia without obstruction or gangrene: Secondary | ICD-10-CM | POA: Diagnosis not present

## 2020-02-10 DIAGNOSIS — D509 Iron deficiency anemia, unspecified: Secondary | ICD-10-CM | POA: Diagnosis not present

## 2020-02-10 DIAGNOSIS — I10 Essential (primary) hypertension: Secondary | ICD-10-CM | POA: Diagnosis not present

## 2020-02-10 DIAGNOSIS — N1832 Chronic kidney disease, stage 3b: Secondary | ICD-10-CM | POA: Diagnosis not present

## 2020-02-10 DIAGNOSIS — M6281 Muscle weakness (generalized): Secondary | ICD-10-CM | POA: Diagnosis not present

## 2020-02-10 DIAGNOSIS — K922 Gastrointestinal hemorrhage, unspecified: Secondary | ICD-10-CM | POA: Diagnosis not present

## 2020-02-10 DIAGNOSIS — K219 Gastro-esophageal reflux disease without esophagitis: Secondary | ICD-10-CM | POA: Diagnosis not present

## 2020-02-10 DIAGNOSIS — Z681 Body mass index (BMI) 19 or less, adult: Secondary | ICD-10-CM | POA: Diagnosis not present

## 2020-02-10 DIAGNOSIS — M130 Polyarthritis, unspecified: Secondary | ICD-10-CM | POA: Diagnosis not present

## 2020-02-10 DIAGNOSIS — E785 Hyperlipidemia, unspecified: Secondary | ICD-10-CM | POA: Diagnosis not present

## 2020-02-10 DIAGNOSIS — R627 Adult failure to thrive: Secondary | ICD-10-CM | POA: Diagnosis not present

## 2020-02-10 DIAGNOSIS — F0390 Unspecified dementia without behavioral disturbance: Secondary | ICD-10-CM | POA: Diagnosis not present

## 2020-02-11 DIAGNOSIS — M130 Polyarthritis, unspecified: Secondary | ICD-10-CM | POA: Diagnosis not present

## 2020-02-11 DIAGNOSIS — N1832 Chronic kidney disease, stage 3b: Secondary | ICD-10-CM | POA: Diagnosis not present

## 2020-02-11 DIAGNOSIS — M6281 Muscle weakness (generalized): Secondary | ICD-10-CM | POA: Diagnosis not present

## 2020-02-11 DIAGNOSIS — K5790 Diverticulosis of intestine, part unspecified, without perforation or abscess without bleeding: Secondary | ICD-10-CM | POA: Diagnosis not present

## 2020-02-11 DIAGNOSIS — D509 Iron deficiency anemia, unspecified: Secondary | ICD-10-CM | POA: Diagnosis not present

## 2020-02-11 DIAGNOSIS — K449 Diaphragmatic hernia without obstruction or gangrene: Secondary | ICD-10-CM | POA: Diagnosis not present

## 2020-02-11 DIAGNOSIS — Z139 Encounter for screening, unspecified: Secondary | ICD-10-CM | POA: Diagnosis not present

## 2020-02-11 DIAGNOSIS — I10 Essential (primary) hypertension: Secondary | ICD-10-CM | POA: Diagnosis not present

## 2020-02-11 DIAGNOSIS — K922 Gastrointestinal hemorrhage, unspecified: Secondary | ICD-10-CM | POA: Diagnosis not present

## 2020-02-11 DIAGNOSIS — M25569 Pain in unspecified knee: Secondary | ICD-10-CM | POA: Diagnosis not present

## 2020-02-11 DIAGNOSIS — R569 Unspecified convulsions: Secondary | ICD-10-CM | POA: Diagnosis not present

## 2020-02-12 DIAGNOSIS — M130 Polyarthritis, unspecified: Secondary | ICD-10-CM | POA: Diagnosis not present

## 2020-02-12 DIAGNOSIS — M6281 Muscle weakness (generalized): Secondary | ICD-10-CM | POA: Diagnosis not present

## 2020-02-12 DIAGNOSIS — N1832 Chronic kidney disease, stage 3b: Secondary | ICD-10-CM | POA: Diagnosis not present

## 2020-02-13 DIAGNOSIS — N1832 Chronic kidney disease, stage 3b: Secondary | ICD-10-CM | POA: Diagnosis not present

## 2020-02-13 DIAGNOSIS — M130 Polyarthritis, unspecified: Secondary | ICD-10-CM | POA: Diagnosis not present

## 2020-02-13 DIAGNOSIS — M6281 Muscle weakness (generalized): Secondary | ICD-10-CM | POA: Diagnosis not present

## 2020-02-16 DIAGNOSIS — N1832 Chronic kidney disease, stage 3b: Secondary | ICD-10-CM | POA: Diagnosis not present

## 2020-02-16 DIAGNOSIS — E559 Vitamin D deficiency, unspecified: Secondary | ICD-10-CM | POA: Diagnosis not present

## 2020-02-16 DIAGNOSIS — D72829 Elevated white blood cell count, unspecified: Secondary | ICD-10-CM | POA: Diagnosis not present

## 2020-02-16 DIAGNOSIS — M130 Polyarthritis, unspecified: Secondary | ICD-10-CM | POA: Diagnosis not present

## 2020-02-16 DIAGNOSIS — E785 Hyperlipidemia, unspecified: Secondary | ICD-10-CM | POA: Diagnosis not present

## 2020-02-16 DIAGNOSIS — D509 Iron deficiency anemia, unspecified: Secondary | ICD-10-CM | POA: Diagnosis not present

## 2020-02-16 DIAGNOSIS — M6281 Muscle weakness (generalized): Secondary | ICD-10-CM | POA: Diagnosis not present

## 2020-02-17 DIAGNOSIS — M130 Polyarthritis, unspecified: Secondary | ICD-10-CM | POA: Diagnosis not present

## 2020-02-17 DIAGNOSIS — M6281 Muscle weakness (generalized): Secondary | ICD-10-CM | POA: Diagnosis not present

## 2020-02-17 DIAGNOSIS — N1832 Chronic kidney disease, stage 3b: Secondary | ICD-10-CM | POA: Diagnosis not present

## 2020-03-05 DIAGNOSIS — F331 Major depressive disorder, recurrent, moderate: Secondary | ICD-10-CM | POA: Diagnosis not present

## 2020-03-05 DIAGNOSIS — F0280 Dementia in other diseases classified elsewhere without behavioral disturbance: Secondary | ICD-10-CM | POA: Diagnosis not present

## 2020-03-05 DIAGNOSIS — G301 Alzheimer's disease with late onset: Secondary | ICD-10-CM | POA: Diagnosis not present

## 2020-04-02 DIAGNOSIS — G301 Alzheimer's disease with late onset: Secondary | ICD-10-CM | POA: Diagnosis not present

## 2020-04-02 DIAGNOSIS — F331 Major depressive disorder, recurrent, moderate: Secondary | ICD-10-CM | POA: Diagnosis not present

## 2020-04-02 DIAGNOSIS — F0280 Dementia in other diseases classified elsewhere without behavioral disturbance: Secondary | ICD-10-CM | POA: Diagnosis not present

## 2020-04-16 DIAGNOSIS — M130 Polyarthritis, unspecified: Secondary | ICD-10-CM | POA: Diagnosis not present

## 2020-04-16 DIAGNOSIS — F0390 Unspecified dementia without behavioral disturbance: Secondary | ICD-10-CM | POA: Diagnosis not present

## 2020-04-16 DIAGNOSIS — N1832 Chronic kidney disease, stage 3b: Secondary | ICD-10-CM | POA: Diagnosis not present

## 2020-04-16 DIAGNOSIS — Z681 Body mass index (BMI) 19 or less, adult: Secondary | ICD-10-CM | POA: Diagnosis not present

## 2020-04-16 DIAGNOSIS — I10 Essential (primary) hypertension: Secondary | ICD-10-CM | POA: Diagnosis not present

## 2020-04-16 DIAGNOSIS — F331 Major depressive disorder, recurrent, moderate: Secondary | ICD-10-CM | POA: Diagnosis not present

## 2020-04-16 DIAGNOSIS — K922 Gastrointestinal hemorrhage, unspecified: Secondary | ICD-10-CM | POA: Diagnosis not present

## 2020-04-16 DIAGNOSIS — K5790 Diverticulosis of intestine, part unspecified, without perforation or abscess without bleeding: Secondary | ICD-10-CM | POA: Diagnosis not present

## 2020-04-16 DIAGNOSIS — R627 Adult failure to thrive: Secondary | ICD-10-CM | POA: Diagnosis not present

## 2020-04-16 DIAGNOSIS — K449 Diaphragmatic hernia without obstruction or gangrene: Secondary | ICD-10-CM | POA: Diagnosis not present

## 2020-04-16 DIAGNOSIS — D509 Iron deficiency anemia, unspecified: Secondary | ICD-10-CM | POA: Diagnosis not present

## 2020-04-16 DIAGNOSIS — E785 Hyperlipidemia, unspecified: Secondary | ICD-10-CM | POA: Diagnosis not present

## 2020-05-07 DIAGNOSIS — G301 Alzheimer's disease with late onset: Secondary | ICD-10-CM | POA: Diagnosis not present

## 2020-05-07 DIAGNOSIS — F331 Major depressive disorder, recurrent, moderate: Secondary | ICD-10-CM | POA: Diagnosis not present

## 2020-05-07 DIAGNOSIS — F0280 Dementia in other diseases classified elsewhere without behavioral disturbance: Secondary | ICD-10-CM | POA: Diagnosis not present

## 2020-06-04 DIAGNOSIS — E785 Hyperlipidemia, unspecified: Secondary | ICD-10-CM | POA: Diagnosis not present

## 2020-06-04 DIAGNOSIS — K5790 Diverticulosis of intestine, part unspecified, without perforation or abscess without bleeding: Secondary | ICD-10-CM | POA: Diagnosis not present

## 2020-06-04 DIAGNOSIS — Z6820 Body mass index (BMI) 20.0-20.9, adult: Secondary | ICD-10-CM | POA: Diagnosis not present

## 2020-06-04 DIAGNOSIS — R627 Adult failure to thrive: Secondary | ICD-10-CM | POA: Diagnosis not present

## 2020-06-04 DIAGNOSIS — N1832 Chronic kidney disease, stage 3b: Secondary | ICD-10-CM | POA: Diagnosis not present

## 2020-06-04 DIAGNOSIS — F331 Major depressive disorder, recurrent, moderate: Secondary | ICD-10-CM | POA: Diagnosis not present

## 2020-06-04 DIAGNOSIS — F0390 Unspecified dementia without behavioral disturbance: Secondary | ICD-10-CM | POA: Diagnosis not present

## 2020-06-04 DIAGNOSIS — E559 Vitamin D deficiency, unspecified: Secondary | ICD-10-CM | POA: Diagnosis not present

## 2020-06-04 DIAGNOSIS — K219 Gastro-esophageal reflux disease without esophagitis: Secondary | ICD-10-CM | POA: Diagnosis not present

## 2020-06-04 DIAGNOSIS — M130 Polyarthritis, unspecified: Secondary | ICD-10-CM | POA: Diagnosis not present

## 2020-06-04 DIAGNOSIS — K449 Diaphragmatic hernia without obstruction or gangrene: Secondary | ICD-10-CM | POA: Diagnosis not present

## 2020-06-04 DIAGNOSIS — I10 Essential (primary) hypertension: Secondary | ICD-10-CM | POA: Diagnosis not present

## 2020-06-22 DIAGNOSIS — F0280 Dementia in other diseases classified elsewhere without behavioral disturbance: Secondary | ICD-10-CM | POA: Diagnosis not present

## 2020-06-22 DIAGNOSIS — G301 Alzheimer's disease with late onset: Secondary | ICD-10-CM | POA: Diagnosis not present

## 2020-06-22 DIAGNOSIS — F331 Major depressive disorder, recurrent, moderate: Secondary | ICD-10-CM | POA: Diagnosis not present

## 2020-07-02 ENCOUNTER — Encounter: Payer: Self-pay | Admitting: *Deleted

## 2020-07-22 DIAGNOSIS — F331 Major depressive disorder, recurrent, moderate: Secondary | ICD-10-CM | POA: Diagnosis not present

## 2020-07-22 DIAGNOSIS — G301 Alzheimer's disease with late onset: Secondary | ICD-10-CM | POA: Diagnosis not present

## 2020-07-22 DIAGNOSIS — F0280 Dementia in other diseases classified elsewhere without behavioral disturbance: Secondary | ICD-10-CM | POA: Diagnosis not present

## 2020-08-13 DIAGNOSIS — M6281 Muscle weakness (generalized): Secondary | ICD-10-CM | POA: Diagnosis not present

## 2020-08-13 DIAGNOSIS — M130 Polyarthritis, unspecified: Secondary | ICD-10-CM | POA: Diagnosis not present

## 2020-08-16 DIAGNOSIS — M130 Polyarthritis, unspecified: Secondary | ICD-10-CM | POA: Diagnosis not present

## 2020-08-16 DIAGNOSIS — M6281 Muscle weakness (generalized): Secondary | ICD-10-CM | POA: Diagnosis not present

## 2020-08-17 DIAGNOSIS — M6281 Muscle weakness (generalized): Secondary | ICD-10-CM | POA: Diagnosis not present

## 2020-08-17 DIAGNOSIS — M130 Polyarthritis, unspecified: Secondary | ICD-10-CM | POA: Diagnosis not present

## 2020-08-18 DIAGNOSIS — M130 Polyarthritis, unspecified: Secondary | ICD-10-CM | POA: Diagnosis not present

## 2020-08-18 DIAGNOSIS — M6281 Muscle weakness (generalized): Secondary | ICD-10-CM | POA: Diagnosis not present

## 2020-08-19 DIAGNOSIS — M130 Polyarthritis, unspecified: Secondary | ICD-10-CM | POA: Diagnosis not present

## 2020-08-19 DIAGNOSIS — M6281 Muscle weakness (generalized): Secondary | ICD-10-CM | POA: Diagnosis not present

## 2020-08-20 DIAGNOSIS — M130 Polyarthritis, unspecified: Secondary | ICD-10-CM | POA: Diagnosis not present

## 2020-08-20 DIAGNOSIS — M6281 Muscle weakness (generalized): Secondary | ICD-10-CM | POA: Diagnosis not present

## 2020-08-23 DIAGNOSIS — M6281 Muscle weakness (generalized): Secondary | ICD-10-CM | POA: Diagnosis not present

## 2020-08-23 DIAGNOSIS — M130 Polyarthritis, unspecified: Secondary | ICD-10-CM | POA: Diagnosis not present

## 2020-08-24 DIAGNOSIS — M130 Polyarthritis, unspecified: Secondary | ICD-10-CM | POA: Diagnosis not present

## 2020-08-24 DIAGNOSIS — M6281 Muscle weakness (generalized): Secondary | ICD-10-CM | POA: Diagnosis not present

## 2020-08-25 DIAGNOSIS — M6281 Muscle weakness (generalized): Secondary | ICD-10-CM | POA: Diagnosis not present

## 2020-08-25 DIAGNOSIS — M130 Polyarthritis, unspecified: Secondary | ICD-10-CM | POA: Diagnosis not present

## 2020-08-26 DIAGNOSIS — M6281 Muscle weakness (generalized): Secondary | ICD-10-CM | POA: Diagnosis not present

## 2020-08-26 DIAGNOSIS — M130 Polyarthritis, unspecified: Secondary | ICD-10-CM | POA: Diagnosis not present

## 2020-08-27 DIAGNOSIS — G301 Alzheimer's disease with late onset: Secondary | ICD-10-CM | POA: Diagnosis not present

## 2020-08-27 DIAGNOSIS — F0280 Dementia in other diseases classified elsewhere without behavioral disturbance: Secondary | ICD-10-CM | POA: Diagnosis not present

## 2020-08-27 DIAGNOSIS — M130 Polyarthritis, unspecified: Secondary | ICD-10-CM | POA: Diagnosis not present

## 2020-08-27 DIAGNOSIS — M6281 Muscle weakness (generalized): Secondary | ICD-10-CM | POA: Diagnosis not present

## 2020-08-27 DIAGNOSIS — F331 Major depressive disorder, recurrent, moderate: Secondary | ICD-10-CM | POA: Diagnosis not present

## 2020-08-30 DIAGNOSIS — M6281 Muscle weakness (generalized): Secondary | ICD-10-CM | POA: Diagnosis not present

## 2020-08-30 DIAGNOSIS — M130 Polyarthritis, unspecified: Secondary | ICD-10-CM | POA: Diagnosis not present

## 2020-08-31 ENCOUNTER — Encounter: Payer: Self-pay | Admitting: *Deleted

## 2020-08-31 DIAGNOSIS — M6281 Muscle weakness (generalized): Secondary | ICD-10-CM | POA: Diagnosis not present

## 2020-08-31 DIAGNOSIS — M130 Polyarthritis, unspecified: Secondary | ICD-10-CM | POA: Diagnosis not present

## 2020-08-31 NOTE — Progress Notes (Signed)

## 2020-09-01 DIAGNOSIS — M6281 Muscle weakness (generalized): Secondary | ICD-10-CM | POA: Diagnosis not present

## 2020-09-01 DIAGNOSIS — M130 Polyarthritis, unspecified: Secondary | ICD-10-CM | POA: Diagnosis not present

## 2020-09-02 DIAGNOSIS — M130 Polyarthritis, unspecified: Secondary | ICD-10-CM | POA: Diagnosis not present

## 2020-09-02 DIAGNOSIS — M6281 Muscle weakness (generalized): Secondary | ICD-10-CM | POA: Diagnosis not present

## 2020-09-02 NOTE — Progress Notes (Signed)
Things That May Be Affecting Your Health:  Alcohol  Hearing loss  Pain   x Depression  Home Safety  Sexual Health   Diabetes  Lack of physical activity  Stress   Difficulty with daily activities  Loneliness  Tiredness   Drug use x Medicines  Tobacco use   Falls  Motor Vehicle Safety  Weight   Food choices  Oral Health  Other    YOUR PERSONALIZED HEALTH PLAN : 1. Schedule your next subsequent Medicare Wellness visit in one year 2. Attend all of your regular appointments to address your medical issues 3. Complete the preventative screenings and services   Annual Wellness Visit   Medicare Covered Preventative Screenings and [place] Men and Women Who How Often Need? Date of Last Service Action  Abdominal Aortic Aneurysm Adults with AAA risk factors Once     Alcohol Misuse and Counseling All Adults Screening once a year if no alcohol misuse. Counseling up to 4 face to face sessions.     Bone Density Measurement  Adults at risk for osteoporosis Once every 2 yrs Y    Lipid Panel Z13.6 All adults without CV disease Once every 5 yrs     Colorectal Cancer   Stool sample or  Colonoscopy All adults 68 and older   Once every year  Every 10 years     Depression All Adults Once a year  Today   Diabetes Screening Blood glucose, post glucose load, or GTT Z13.1  All adults at risk  Pre-diabetics  Once per year  Twice per year     Diabetes  Self-Management Training All adults Diabetics 10 hrs first year; 2 hours subsequent years. Requires Copay     Glaucoma  Diabetics  Family history of glaucoma  African Americans 70 yrs +  Hispanic Americans 48 yrs + Annually - requires coppay     Hepatitis C Z72.89 or F19.20  High Risk for HCV  Born between 1945 and 1965  Annually  Once     HIV Z11.4 All adults based on risk  Annually btw ages 52 & 39 regardless of risk  Annually > 65 yrs if at increased risk     Lung Cancer Screening Asymptomatic adults aged  42-77 with 30 pack yr history and current smoker OR quit within the last 15 yrs Annually Must have counseling and shared decision making documentation before first screen     Medical Nutrition Therapy Adults with   Diabetes  Renal disease  Kidney transplant within past 3 yrs 3 hours first year; 2 hours subsequent years     Obesity and Counseling All adults Screening once a year Counseling if BMI 30 or higher  Today   Tobacco Use Counseling Adults who use tobacco  Up to 8 visits in one year     Vaccines Z23  Hepatitis B  Influenza   Pneumonia  Adults   Once  Once every flu season  Two different vaccines separated by one year     Next Annual Wellness Visit People with Medicare Every year  Today     Services & Screenings Women Who How Often Need  Date of Last Service Action  Mammogram  Z12.31 Women over 68 One baseline ages 43-39. Annually ager 40 yrs+     Pap tests All women Annually if high risk. Every 2 yrs for normal risk women     Screening for cervical cancer with   Pap (Z01.419 nl or Z01.411abnl) &  HPV Z11.51 Women aged 74 to 90 Once every 5 yrs     Screening pelvic and breast exams All women Annually if high risk. Every 2 yrs for normal risk women     Sexually Transmitted Diseases  Chlamydia  Gonorrhea  Syphilis All at risk adults Annually for non pregnant females at increased risk         [place] Men Who How Ofter Need  Date of Last Service Action  Prostate Cancer - DRE & PSA Men over 50 Annually.  DRE might require a copay.     Sexually Transmitted Diseases  Syphilis All at risk adults Annually for men at increased risk       Ask about vaccinations, including covid and influenza

## 2020-09-03 DIAGNOSIS — M130 Polyarthritis, unspecified: Secondary | ICD-10-CM | POA: Diagnosis not present

## 2020-09-03 DIAGNOSIS — M6281 Muscle weakness (generalized): Secondary | ICD-10-CM | POA: Diagnosis not present

## 2020-09-06 DIAGNOSIS — M130 Polyarthritis, unspecified: Secondary | ICD-10-CM | POA: Diagnosis not present

## 2020-09-06 DIAGNOSIS — M6281 Muscle weakness (generalized): Secondary | ICD-10-CM | POA: Diagnosis not present

## 2020-09-07 DIAGNOSIS — M130 Polyarthritis, unspecified: Secondary | ICD-10-CM | POA: Diagnosis not present

## 2020-09-07 DIAGNOSIS — M6281 Muscle weakness (generalized): Secondary | ICD-10-CM | POA: Diagnosis not present

## 2020-09-08 DIAGNOSIS — M130 Polyarthritis, unspecified: Secondary | ICD-10-CM | POA: Diagnosis not present

## 2020-09-08 DIAGNOSIS — M6281 Muscle weakness (generalized): Secondary | ICD-10-CM | POA: Diagnosis not present

## 2020-09-09 DIAGNOSIS — M6281 Muscle weakness (generalized): Secondary | ICD-10-CM | POA: Diagnosis not present

## 2020-09-09 DIAGNOSIS — M130 Polyarthritis, unspecified: Secondary | ICD-10-CM | POA: Diagnosis not present

## 2020-09-17 DIAGNOSIS — F0280 Dementia in other diseases classified elsewhere without behavioral disturbance: Secondary | ICD-10-CM | POA: Diagnosis not present

## 2020-09-17 DIAGNOSIS — G301 Alzheimer's disease with late onset: Secondary | ICD-10-CM | POA: Diagnosis not present

## 2020-09-17 DIAGNOSIS — F331 Major depressive disorder, recurrent, moderate: Secondary | ICD-10-CM | POA: Diagnosis not present

## 2020-10-05 DIAGNOSIS — I739 Peripheral vascular disease, unspecified: Secondary | ICD-10-CM | POA: Diagnosis not present

## 2020-10-05 DIAGNOSIS — M21612 Bunion of left foot: Secondary | ICD-10-CM | POA: Diagnosis not present

## 2020-10-05 DIAGNOSIS — L602 Onychogryphosis: Secondary | ICD-10-CM | POA: Diagnosis not present

## 2020-10-12 DIAGNOSIS — E785 Hyperlipidemia, unspecified: Secondary | ICD-10-CM | POA: Diagnosis not present

## 2020-10-12 DIAGNOSIS — R569 Unspecified convulsions: Secondary | ICD-10-CM | POA: Diagnosis not present

## 2020-10-12 DIAGNOSIS — F0390 Unspecified dementia without behavioral disturbance: Secondary | ICD-10-CM | POA: Diagnosis not present

## 2020-10-12 DIAGNOSIS — M6281 Muscle weakness (generalized): Secondary | ICD-10-CM | POA: Diagnosis not present

## 2020-10-12 DIAGNOSIS — E559 Vitamin D deficiency, unspecified: Secondary | ICD-10-CM | POA: Diagnosis not present

## 2020-10-12 DIAGNOSIS — F331 Major depressive disorder, recurrent, moderate: Secondary | ICD-10-CM | POA: Diagnosis not present

## 2020-10-12 DIAGNOSIS — I10 Essential (primary) hypertension: Secondary | ICD-10-CM | POA: Diagnosis not present

## 2020-10-12 DIAGNOSIS — N1832 Chronic kidney disease, stage 3b: Secondary | ICD-10-CM | POA: Diagnosis not present

## 2020-10-12 DIAGNOSIS — M130 Polyarthritis, unspecified: Secondary | ICD-10-CM | POA: Diagnosis not present

## 2020-10-13 DIAGNOSIS — M130 Polyarthritis, unspecified: Secondary | ICD-10-CM | POA: Diagnosis not present

## 2020-10-13 DIAGNOSIS — R2681 Unsteadiness on feet: Secondary | ICD-10-CM | POA: Diagnosis not present

## 2020-10-14 DIAGNOSIS — R2681 Unsteadiness on feet: Secondary | ICD-10-CM | POA: Diagnosis not present

## 2020-10-14 DIAGNOSIS — M130 Polyarthritis, unspecified: Secondary | ICD-10-CM | POA: Diagnosis not present

## 2020-10-15 DIAGNOSIS — F331 Major depressive disorder, recurrent, moderate: Secondary | ICD-10-CM | POA: Diagnosis not present

## 2020-10-15 DIAGNOSIS — R2681 Unsteadiness on feet: Secondary | ICD-10-CM | POA: Diagnosis not present

## 2020-10-15 DIAGNOSIS — G301 Alzheimer's disease with late onset: Secondary | ICD-10-CM | POA: Diagnosis not present

## 2020-10-15 DIAGNOSIS — F0280 Dementia in other diseases classified elsewhere without behavioral disturbance: Secondary | ICD-10-CM | POA: Diagnosis not present

## 2020-10-15 DIAGNOSIS — M130 Polyarthritis, unspecified: Secondary | ICD-10-CM | POA: Diagnosis not present

## 2020-10-18 DIAGNOSIS — M130 Polyarthritis, unspecified: Secondary | ICD-10-CM | POA: Diagnosis not present

## 2020-10-18 DIAGNOSIS — R2681 Unsteadiness on feet: Secondary | ICD-10-CM | POA: Diagnosis not present

## 2020-10-19 DIAGNOSIS — R2681 Unsteadiness on feet: Secondary | ICD-10-CM | POA: Diagnosis not present

## 2020-10-19 DIAGNOSIS — M130 Polyarthritis, unspecified: Secondary | ICD-10-CM | POA: Diagnosis not present

## 2020-10-20 DIAGNOSIS — I10 Essential (primary) hypertension: Secondary | ICD-10-CM | POA: Diagnosis not present

## 2020-10-20 DIAGNOSIS — H04123 Dry eye syndrome of bilateral lacrimal glands: Secondary | ICD-10-CM | POA: Diagnosis not present

## 2020-10-20 DIAGNOSIS — H2513 Age-related nuclear cataract, bilateral: Secondary | ICD-10-CM | POA: Diagnosis not present

## 2020-10-20 DIAGNOSIS — F0390 Unspecified dementia without behavioral disturbance: Secondary | ICD-10-CM | POA: Diagnosis not present

## 2020-10-20 DIAGNOSIS — M130 Polyarthritis, unspecified: Secondary | ICD-10-CM | POA: Diagnosis not present

## 2020-10-20 DIAGNOSIS — R2681 Unsteadiness on feet: Secondary | ICD-10-CM | POA: Diagnosis not present

## 2020-10-21 DIAGNOSIS — R2681 Unsteadiness on feet: Secondary | ICD-10-CM | POA: Diagnosis not present

## 2020-10-21 DIAGNOSIS — M130 Polyarthritis, unspecified: Secondary | ICD-10-CM | POA: Diagnosis not present

## 2020-10-22 DIAGNOSIS — R2681 Unsteadiness on feet: Secondary | ICD-10-CM | POA: Diagnosis not present

## 2020-10-22 DIAGNOSIS — M130 Polyarthritis, unspecified: Secondary | ICD-10-CM | POA: Diagnosis not present

## 2020-10-25 DIAGNOSIS — R2681 Unsteadiness on feet: Secondary | ICD-10-CM | POA: Diagnosis not present

## 2020-10-25 DIAGNOSIS — M130 Polyarthritis, unspecified: Secondary | ICD-10-CM | POA: Diagnosis not present

## 2020-10-26 DIAGNOSIS — R2681 Unsteadiness on feet: Secondary | ICD-10-CM | POA: Diagnosis not present

## 2020-10-26 DIAGNOSIS — M130 Polyarthritis, unspecified: Secondary | ICD-10-CM | POA: Diagnosis not present

## 2020-10-27 DIAGNOSIS — R2681 Unsteadiness on feet: Secondary | ICD-10-CM | POA: Diagnosis not present

## 2020-10-27 DIAGNOSIS — M130 Polyarthritis, unspecified: Secondary | ICD-10-CM | POA: Diagnosis not present

## 2020-10-28 DIAGNOSIS — R2681 Unsteadiness on feet: Secondary | ICD-10-CM | POA: Diagnosis not present

## 2020-10-28 DIAGNOSIS — M130 Polyarthritis, unspecified: Secondary | ICD-10-CM | POA: Diagnosis not present

## 2020-10-29 DIAGNOSIS — R2681 Unsteadiness on feet: Secondary | ICD-10-CM | POA: Diagnosis not present

## 2020-10-29 DIAGNOSIS — M130 Polyarthritis, unspecified: Secondary | ICD-10-CM | POA: Diagnosis not present

## 2020-11-01 DIAGNOSIS — M130 Polyarthritis, unspecified: Secondary | ICD-10-CM | POA: Diagnosis not present

## 2020-11-01 DIAGNOSIS — R2681 Unsteadiness on feet: Secondary | ICD-10-CM | POA: Diagnosis not present

## 2020-11-02 DIAGNOSIS — R2681 Unsteadiness on feet: Secondary | ICD-10-CM | POA: Diagnosis not present

## 2020-11-02 DIAGNOSIS — M130 Polyarthritis, unspecified: Secondary | ICD-10-CM | POA: Diagnosis not present

## 2020-11-03 DIAGNOSIS — M130 Polyarthritis, unspecified: Secondary | ICD-10-CM | POA: Diagnosis not present

## 2020-11-03 DIAGNOSIS — R2681 Unsteadiness on feet: Secondary | ICD-10-CM | POA: Diagnosis not present

## 2020-11-04 DIAGNOSIS — M130 Polyarthritis, unspecified: Secondary | ICD-10-CM | POA: Diagnosis not present

## 2020-11-04 DIAGNOSIS — R2681 Unsteadiness on feet: Secondary | ICD-10-CM | POA: Diagnosis not present

## 2020-11-05 DIAGNOSIS — G301 Alzheimer's disease with late onset: Secondary | ICD-10-CM | POA: Diagnosis not present

## 2020-11-05 DIAGNOSIS — F331 Major depressive disorder, recurrent, moderate: Secondary | ICD-10-CM | POA: Diagnosis not present

## 2020-11-05 DIAGNOSIS — M130 Polyarthritis, unspecified: Secondary | ICD-10-CM | POA: Diagnosis not present

## 2020-11-05 DIAGNOSIS — R2681 Unsteadiness on feet: Secondary | ICD-10-CM | POA: Diagnosis not present

## 2020-11-05 DIAGNOSIS — F0280 Dementia in other diseases classified elsewhere without behavioral disturbance: Secondary | ICD-10-CM | POA: Diagnosis not present

## 2020-11-08 DIAGNOSIS — R2681 Unsteadiness on feet: Secondary | ICD-10-CM | POA: Diagnosis not present

## 2020-11-08 DIAGNOSIS — M130 Polyarthritis, unspecified: Secondary | ICD-10-CM | POA: Diagnosis not present

## 2020-11-09 DIAGNOSIS — R2681 Unsteadiness on feet: Secondary | ICD-10-CM | POA: Diagnosis not present

## 2020-11-09 DIAGNOSIS — M130 Polyarthritis, unspecified: Secondary | ICD-10-CM | POA: Diagnosis not present

## 2020-11-10 IMAGING — DX DG CHEST 1V PORT
1 series · 1 of 1 positions shown · non-contrast
Comparison: 05/19/2008

CLINICAL DATA: Lung crackles. Hypertension.

EXAM:
PORTABLE CHEST 1 VIEW

[chest]
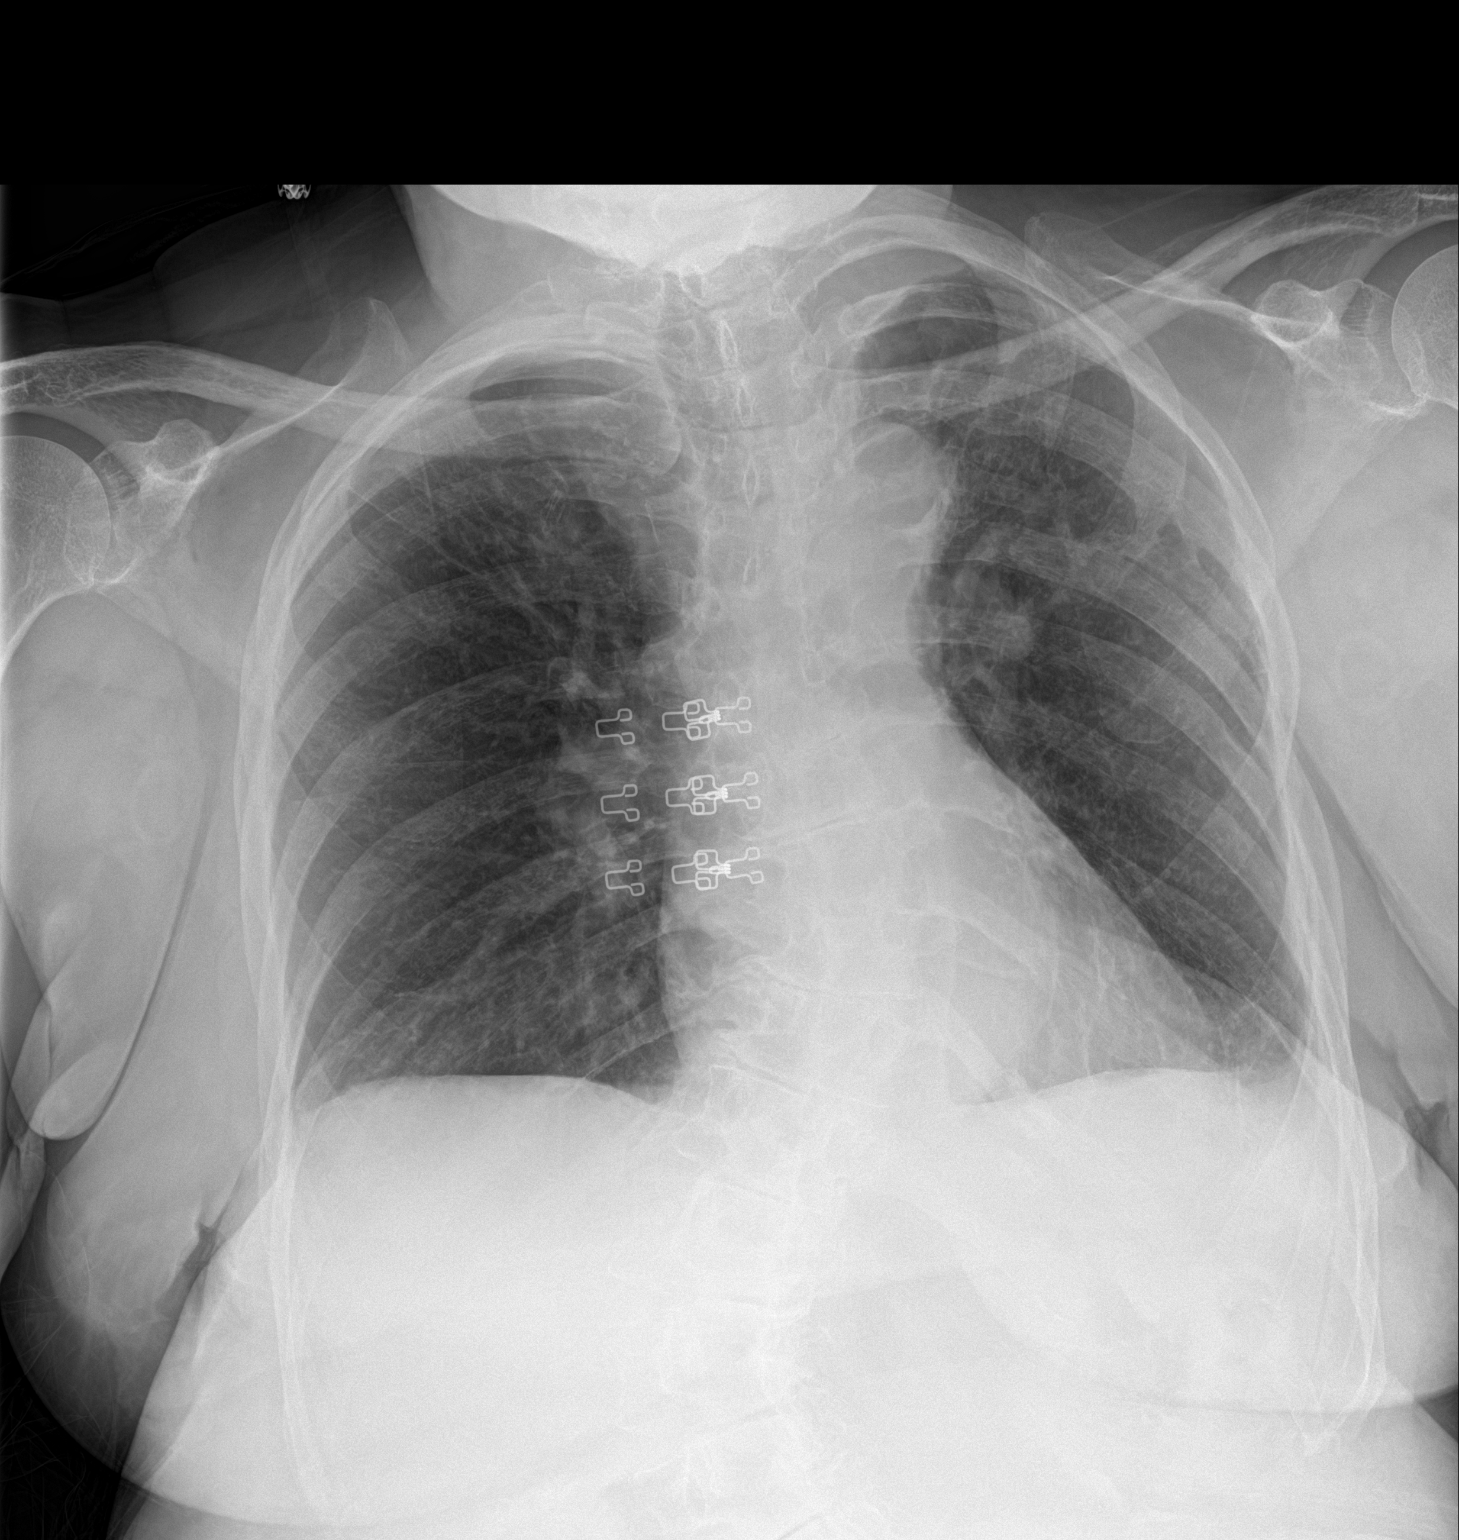

[1 of 1 positions shown; findings below may reference images not displayed]

FINDINGS: Chronic blunting of the left lateral costophrenic angle. Chronic
left upper rib deformities and chronic thoracolumbar scoliosis.

Atherosclerotic calcification of the aortic arch. Upper normal heart
size. Mild upper zone pulmonary vascular prominence particularly on
the left which could indicate pulmonary venous hypertension.

Chronic pleural thickening along the apices, left greater than
right.
IMPRESSION: 1. Possible pulmonary venous hypertension, without overt edema.
2. Chronic blunting of the left lateral costophrenic angle.
3. Scoliosis.  Chronic left upper rib deformities.

## 2020-12-10 DIAGNOSIS — G301 Alzheimer's disease with late onset: Secondary | ICD-10-CM | POA: Diagnosis not present

## 2020-12-10 DIAGNOSIS — F331 Major depressive disorder, recurrent, moderate: Secondary | ICD-10-CM | POA: Diagnosis not present

## 2020-12-10 DIAGNOSIS — F0280 Dementia in other diseases classified elsewhere without behavioral disturbance: Secondary | ICD-10-CM | POA: Diagnosis not present

## 2020-12-23 DIAGNOSIS — L602 Onychogryphosis: Secondary | ICD-10-CM | POA: Diagnosis not present

## 2020-12-23 DIAGNOSIS — L853 Xerosis cutis: Secondary | ICD-10-CM | POA: Diagnosis not present

## 2020-12-23 DIAGNOSIS — I739 Peripheral vascular disease, unspecified: Secondary | ICD-10-CM | POA: Diagnosis not present

## 2023-10-08 ENCOUNTER — Encounter (HOSPITAL_COMMUNITY): Payer: Self-pay

## 2023-10-08 ENCOUNTER — Emergency Department (HOSPITAL_COMMUNITY)
Admission: EM | Admit: 2023-10-08 | Discharge: 2023-10-08 | Disposition: A | Discharge status: Home / Self Care | Attending: Emergency Medicine | Admitting: Emergency Medicine

## 2023-10-08 ENCOUNTER — Other Ambulatory Visit: Payer: Self-pay

## 2023-10-08 ENCOUNTER — Emergency Department (HOSPITAL_COMMUNITY)

## 2023-10-08 DIAGNOSIS — I10 Essential (primary) hypertension: Secondary | ICD-10-CM | POA: Insufficient documentation

## 2023-10-08 DIAGNOSIS — R059 Cough, unspecified: Secondary | ICD-10-CM | POA: Diagnosis present

## 2023-10-08 DIAGNOSIS — F0390 Unspecified dementia without behavioral disturbance: Secondary | ICD-10-CM | POA: Insufficient documentation

## 2023-10-08 DIAGNOSIS — U071 COVID-19: Secondary | ICD-10-CM | POA: Diagnosis not present

## 2023-10-08 DIAGNOSIS — Z79899 Other long term (current) drug therapy: Secondary | ICD-10-CM | POA: Diagnosis not present

## 2023-10-08 LAB — RESP PANEL BY RT-PCR (RSV, FLU A&B, COVID)  RVPGX2
Influenza A by PCR: NEGATIVE
Influenza B by PCR: NEGATIVE
Resp Syncytial Virus by PCR: NEGATIVE
SARS Coronavirus 2 by RT PCR: POSITIVE — AB

## 2023-10-08 LAB — COMPREHENSIVE METABOLIC PANEL
ALT: 17 U/L (ref 0–44)
AST: 29 U/L (ref 15–41)
Albumin: 3.2 g/dL — ABNORMAL LOW (ref 3.5–5.0)
Alkaline Phosphatase: 99 U/L (ref 38–126)
Anion gap: 9 (ref 5–15)
BUN: 16 mg/dL (ref 8–23)
CO2: 24 mmol/L (ref 22–32)
Calcium: 8.9 mg/dL (ref 8.9–10.3)
Chloride: 104 mmol/L (ref 98–111)
Creatinine, Ser: 0.87 mg/dL (ref 0.44–1.00)
GFR, Estimated: >60 mL/min (ref >60)
Glucose, Bld: 114 mg/dL — ABNORMAL HIGH (ref 70–99)
Potassium: 4 mmol/L (ref 3.5–5.1)
Sodium: 137 mmol/L (ref 135–145)
Total Bilirubin: 0.3 mg/dL (ref 0.0–1.2)
Total Protein: 7.3 g/dL (ref 6.5–8.1)

## 2023-10-08 LAB — CBC WITH DIFFERENTIAL/PLATELET
Abs Immature Granulocytes: 0.05 10*3/uL (ref 0.00–0.07)
Basophils Absolute: 0.1 10*3/uL (ref 0.0–0.1)
Basophils Relative: 1 %
Eosinophils Absolute: 0.1 10*3/uL (ref 0.0–0.5)
Eosinophils Relative: 1 %
HCT: 34.8 % — ABNORMAL LOW (ref 36.0–46.0)
Hemoglobin: 11.2 g/dL — ABNORMAL LOW (ref 12.0–15.0)
Immature Granulocytes: 1 %
Lymphocytes Relative: 26 %
Lymphs Abs: 2.3 10*3/uL (ref 0.7–4.0)
MCH: 28.3 pg (ref 26.0–34.0)
MCHC: 32.2 g/dL (ref 30.0–36.0)
MCV: 87.9 fL (ref 80.0–100.0)
Monocytes Absolute: 1.9 10*3/uL — ABNORMAL HIGH (ref 0.1–1.0)
Monocytes Relative: 21 %
Neutro Abs: 4.6 10*3/uL (ref 1.7–7.7)
Neutrophils Relative %: 50 %
Platelets: 380 10*3/uL (ref 150–400)
RBC: 3.96 MIL/uL (ref 3.87–5.11)
RDW: 15.7 % — ABNORMAL HIGH (ref 11.5–15.5)
WBC: 9.1 10*3/uL (ref 4.0–10.5)
nRBC: 0 % (ref 0.0–0.2)

## 2023-10-08 MED ORDER — PAXLOVID (300/100) 20 X 150 MG & 10 X 100MG PO TBPK: 3.0000 | ORAL_TABLET | Freq: Two times a day (BID) | ORAL | 0 refills | Status: AC

## 2023-10-08 MED ORDER — BENZONATATE 100 MG PO CAPS: 100.0000 mg | ORAL_CAPSULE | Freq: Three times a day (TID) | ORAL | 0 refills | Status: AC

## 2023-10-08 NOTE — ED Notes (Signed)
 2 attempts made to start IV unsuccessfully.  Pt went to xray.  Phleb to draw labs upon return.

## 2023-10-08 NOTE — ED Notes (Signed)
 Ptar called, most likely an hour-hour and a half

## 2023-10-08 NOTE — ED Triage Notes (Signed)
 Pt from home with flu-like symptoms since Friday including non-productive cough. Crackles in left right lungs per EMS. Family reports difficulty breathing. Alert, oriented to person which is equal to baseline for this pt.

## 2023-10-08 NOTE — ED Provider Notes (Signed)
 [place] [facility]MENT AT [facility] Provider Note   CSN: [id number] Arrival date & time: 10/08/23  1001     History  Chief Complaint  Patient presents with   Flu Like Symptoms    Caitlin Cervantes is a 83 y.o. female.  Patient is an 83 year old female with a past medical history of dementia ANO x 1 at baseline, seizure disorder, hypertension presenting to the emergency department with cough.  Patient is here with her son who reports that her caretaker informed him that she has been sick throughout the weekend and thinks that she has the flu.  He states that his mother has also been feeling unwell and has had less of an appetite.  He states that she has been coughing and congested and has looked short of breath.  He denies any fevers, vomiting or diarrhea.  He states that she has not complained of any pain.  The history is provided by the EMS personnel and a relative. History limited by: level 5 caveat for Dementia.       Home Medications Prior to Admission medications   Medication Sig Start Date End Date Taking? Authorizing Provider  benzonatate (TESSALON) 100 MG capsule Take 1 capsule (100 mg total) by mouth every 8 (eight) hours. 10/08/23  Yes Elayne Snare K, DO  nirmatrelvir/ritonavir (PAXLOVID, 300/100,) 20 x 150 MG & 10 x 100MG  TBPK Take 3 tablets by mouth 2 (two) times daily for 5 days. Patient GFR is >60. Take nirmatrelvir (150 mg) two tablets twice daily for 5 days and ritonavir (100 mg) one tablet twice daily for 5 days. 10/08/23 10/13/23 Yes Theresia Lo, Benetta Spar K, DO  amLODipine (NORVASC) 2.5 MG tablet TAKE ONE TABLET BY MOUTH DAILY Patient taking differently: Take 2.5 mg by mouth daily.  08/12/19   Tyson Alias, MD  atorvastatin (LIPITOR) 40 MG tablet TAKE 1 TABLET (40 MG TOTAL) BY MOUTH DAILY AT 6 PM. 08/12/19   Tyson Alias, MD  donepezil (ARICEPT) 5 MG tablet Take 1 tablet (5 mg total) by mouth at bedtime. 10/06/19 10/05/20  Synetta Fail, MD  iron polysaccharides (NU-IRON) 150 MG capsule Take 1 capsule (150 mg total) by mouth daily. 10/02/19   Elige Radon, MD  levETIRAcetam (KEPPRA) 500 MG tablet TAKE ONE TABLET BY MOUTH TWICE A DAY Patient taking differently: Take 500 mg by mouth 2 (two) times daily.  08/12/19   Tyson Alias, MD  pantoprazole (PROTONIX) 40 MG tablet Take 1 tablet (40 mg total) by mouth 2 (two) times daily for 28 days. 10/30/19 11/27/19  Katherine Roan, MD      Allergies    Patient has no known allergies.    Review of Systems   Review of Systems  Physical Exam Updated Vital Signs BP (!) 150/84   Pulse 82   Temp 98.9 F (37.2 C) (Oral)   Resp 17   Ht 5\' 1"  (1.549 m)   LMP  (LMP Unknown)   SpO2 100%   BMI 21.87 kg/m  Physical Exam Vitals and nursing note reviewed.  Constitutional:      General: She is not in acute distress.    Appearance: Normal appearance.  HENT:     Head: Normocephalic and atraumatic.     Nose: Nose normal.     Mouth/Throat:     Mouth: Mucous membranes are moist.  Eyes:     Extraocular Movements: Extraocular movements intact.     Conjunctiva/sclera: Conjunctivae normal.  Cardiovascular:  Rate and Rhythm: Normal rate and regular rhythm.     Heart sounds: Normal heart sounds.  Pulmonary:     Effort: Pulmonary effort is normal.     Breath sounds: Normal breath sounds.  Abdominal:     General: Abdomen is flat.     Palpations: Abdomen is soft.     Tenderness: There is no abdominal tenderness.  Musculoskeletal:        General: Normal range of motion.     Cervical back: Normal range of motion.     Right lower leg: No edema.     Left lower leg: No edema.  Skin:    General: Skin is warm and dry.  Neurological:     General: No focal deficit present.     Mental Status: She is alert.     Comments: Oriented to person only  Psychiatric:        Mood and Affect: Mood normal.        Behavior: Behavior normal.     ED Results / Procedures /  Treatments   Labs (all labs ordered are listed, but only abnormal results are displayed) Labs Reviewed  RESP PANEL BY RT-PCR (RSV, FLU A&B, COVID)  RVPGX2 - Abnormal; Notable for the following components:      Result Value   SARS Coronavirus 2 by RT PCR POSITIVE (*)    All other components within normal limits  COMPREHENSIVE METABOLIC PANEL - Abnormal; Notable for the following components:   Glucose, Bld 114 (*)    Albumin 3.2 (*)    All other components within normal limits  CBC WITH DIFFERENTIAL/PLATELET - Abnormal; Notable for the following components:   Hemoglobin 11.2 (*)    HCT 34.8 (*)    RDW 15.7 (*)    Monocytes Absolute 1.9 (*)    All other components within normal limits    EKG EKG Interpretation Date/Time:  Monday October 08 2023 12:10:12 EDT Ventricular Rate:  85 PR Interval:  172 QRS Duration:  78 QT Interval:  340 QTC Calculation: 404 R Axis:   84  Text Interpretation: Sinus rhythm with Premature supraventricular complexes Nonspecific T wave abnormality Abnormal ECG No significant change since last tracing Confirmed by Elayne Snare ([id number]) on 10/08/2023 12:11:50 PM  Radiology DG Chest 1 View Result Date: 10/08/2023 CLINICAL DATA:  Cough EXAM: CHEST  1 VIEW COMPARISON:  X-ray 10/07/2019 FINDINGS: Underinflation. Stable cardiopericardial silhouette when adjusted for level inflation. Calcified tortuous aorta. Bronchovascular crowding. No pneumothorax, effusion or consolidation. No edema. Films are under penetrated. IMPRESSION: Underinflated and under penetrated radiograph. No acute cardiopulmonary disease when adjusting for technique. Follow-up x-ray with improved inflation could be considered when clinically appropriate for further sensitivity. Electronically Signed   By: Karen Kays M.D.   On: 10/08/2023 11:45    Procedures Procedures    Medications Ordered in ED Medications - No data to display  ED Course/ Medical Decision Making/ A&P Clinical Course as  of 10/08/23 1452  Mon Oct 08, 2023  1211 Labs within normal range, no acute abnormality on CXR. Viral swab pending. [VK]  1433 Patient is COVID positive. She is within the window for Paxlovid treatment and is otherwise stable for discharge with outpatient follow up. [VK]    Clinical Course User Index [VK] Rexford Maus, DO                                 Medical Decision Making  This patient presents to the ED with chief complaint(s) of cough with pertinent past medical history of dementia, seizure disorder, HTN which further complicates the presenting complaint. The complaint involves an extensive differential diagnosis and also carries with it a high risk of complications and morbidity.    The differential diagnosis includes viral syndrome, pneumonia, pneumothorax, pulmonary edema, pleural effusion, arrhythmia, anemia, dehydration, electrolyte abnormality, patient has normal vital signs here making sepsis unlikely  Additional history obtained: Additional history obtained from family Records reviewed N/A  ED Course and Reassessment: On patient's arrival she is hemodynamically stable in no acute distress.  Will have EKG, labs and chest x-ray performed to evaluate for cause of her symptoms and she will be closely reassessed.  Independent labs interpretation:  The following labs were independently interpreted: COVID positive, otherwise labs within normal range  Independent visualization of imaging: - I independently visualized the following imaging with scope of interpretation limited to determining acute life threatening conditions related to emergency care: CXR, which revealed no acute disease  Consultation: - Consulted or discussed management/test interpretation w/ external professional: N/A  Consideration for admission or further workup: Patient has no emergent conditions requiring admission or further work-up at this time and is stable for discharge home with primary care  follow-up  Social Determinants of health: N/A    Amount and/or Complexity of Data Reviewed Labs: ordered. Radiology: ordered.  Risk Prescription drug management.          Final Clinical Impression(s) / ED Diagnoses Final diagnoses:  COVID-19    Rx / DC Orders ED Discharge Orders          Ordered    nirmatrelvir/ritonavir (PAXLOVID, 300/100,) 20 x 150 MG & 10 x 100MG  TBPK  2 times daily        10/08/23 1451    benzonatate (TESSALON) 100 MG capsule  Every 8 hours        10/08/23 1451              Elayne Snare K, DO 10/08/23 1452

## 2023-10-08 NOTE — Discharge Instructions (Signed)
 You were seen in the emergency department for your weakness and your cough.  You tested positive for COVID-19.  You had no signs of flu, dehydration or other complications from your COVID infection at this time.  I have given you prescription of Paxlovid and you should complete this as prescribed.  You should not take your cholesterol medication while on Paxlovid and can resume it once you have completed the Paxlovid course.  We have also given you Tessalon to take as needed for cough.  You should make sure you are drinking plenty of fluids and staying well-hydrated and can follow-up with your primary doctor in the next few days to have your symptoms rechecked.  You should return to the emergency department if you have significantly worsening shortness of breath, severe chest pain, you are having severe dehydration or any other new or concerning symptoms.
